# Patient Record
Sex: Male | Born: 1940 | Race: White | Hispanic: No | Marital: Married | Smoking: Former smoker
Health system: Southern US, Community
[De-identification: ages and names within clinical notes are randomized; demographics above are authoritative.]

## PROBLEM LIST (undated history)

## (undated) DIAGNOSIS — I714 Abdominal aortic aneurysm, without rupture, unspecified: Secondary | ICD-10-CM

## (undated) DIAGNOSIS — K5790 Diverticulosis of intestine, part unspecified, without perforation or abscess without bleeding: Secondary | ICD-10-CM

## (undated) DIAGNOSIS — IMO0002 Reserved for concepts with insufficient information to code with codable children: Secondary | ICD-10-CM

## (undated) DIAGNOSIS — E041 Nontoxic single thyroid nodule: Secondary | ICD-10-CM

## (undated) DIAGNOSIS — J40 Bronchitis, not specified as acute or chronic: Secondary | ICD-10-CM

## (undated) DIAGNOSIS — M899 Disorder of bone, unspecified: Secondary | ICD-10-CM

## (undated) DIAGNOSIS — N529 Male erectile dysfunction, unspecified: Secondary | ICD-10-CM

## (undated) DIAGNOSIS — J449 Chronic obstructive pulmonary disease, unspecified: Secondary | ICD-10-CM

## (undated) DIAGNOSIS — E785 Hyperlipidemia, unspecified: Secondary | ICD-10-CM

## (undated) DIAGNOSIS — C801 Malignant (primary) neoplasm, unspecified: Secondary | ICD-10-CM

## (undated) DIAGNOSIS — R0602 Shortness of breath: Secondary | ICD-10-CM

## (undated) DIAGNOSIS — M549 Dorsalgia, unspecified: Secondary | ICD-10-CM

## (undated) DIAGNOSIS — I739 Peripheral vascular disease, unspecified: Secondary | ICD-10-CM

## (undated) DIAGNOSIS — I499 Cardiac arrhythmia, unspecified: Secondary | ICD-10-CM

## (undated) DIAGNOSIS — J439 Emphysema, unspecified: Secondary | ICD-10-CM

## (undated) DIAGNOSIS — K219 Gastro-esophageal reflux disease without esophagitis: Secondary | ICD-10-CM

## (undated) DIAGNOSIS — J189 Pneumonia, unspecified organism: Secondary | ICD-10-CM

## (undated) DIAGNOSIS — D649 Anemia, unspecified: Secondary | ICD-10-CM

## (undated) DIAGNOSIS — I10 Essential (primary) hypertension: Secondary | ICD-10-CM

## (undated) HISTORY — DX: Dorsalgia, unspecified: M54.9

## (undated) HISTORY — DX: Gastro-esophageal reflux disease without esophagitis: K21.9

## (undated) HISTORY — PX: COLONOSCOPY W/ POLYPECTOMY: SHX1380

## (undated) HISTORY — DX: Male erectile dysfunction, unspecified: N52.9

## (undated) HISTORY — DX: Nontoxic single thyroid nodule: E04.1

## (undated) HISTORY — DX: Disorder of bone, unspecified: M89.9

## (undated) HISTORY — DX: Reserved for concepts with insufficient information to code with codable children: IMO0002

## (undated) HISTORY — DX: Abdominal aortic aneurysm, without rupture, unspecified: I71.40

## (undated) HISTORY — DX: Hyperlipidemia, unspecified: E78.5

## (undated) HISTORY — DX: Chronic obstructive pulmonary disease, unspecified: J44.9

## (undated) HISTORY — DX: Essential (primary) hypertension: I10

## (undated) HISTORY — DX: Cardiac arrhythmia, unspecified: I49.9

## (undated) HISTORY — DX: Peripheral vascular disease, unspecified: I73.9

## (undated) HISTORY — PX: TONSILLECTOMY: SUR1361

## (undated) HISTORY — DX: Diverticulosis of intestine, part unspecified, without perforation or abscess without bleeding: K57.90

## (undated) HISTORY — PX: SKIN GRAFT: SHX250

## (undated) HISTORY — DX: Bronchitis, not specified as acute or chronic: J40

## (undated) HISTORY — DX: Abdominal aortic aneurysm, without rupture: I71.4

## (undated) HISTORY — PX: SMALL INTESTINE SURGERY: SHX150

## (undated) HISTORY — DX: Anemia, unspecified: D64.9

---

## 1959-07-05 HISTORY — PX: COLECTOMY: SHX59

## 1998-01-22 ENCOUNTER — Emergency Department (HOSPITAL_COMMUNITY): Admission: EM | Admit: 1998-01-22 | Discharge: 1998-01-22 | Payer: Self-pay | Admitting: Emergency Medicine

## 1998-07-09 ENCOUNTER — Ambulatory Visit: Admission: RE | Admit: 1998-07-09 | Discharge: 1998-07-09 | Payer: Self-pay | Admitting: *Deleted

## 2004-08-24 ENCOUNTER — Ambulatory Visit: Payer: Self-pay | Admitting: Internal Medicine

## 2004-10-04 ENCOUNTER — Ambulatory Visit: Payer: Self-pay | Admitting: Gastroenterology

## 2004-10-13 ENCOUNTER — Ambulatory Visit: Payer: Self-pay | Admitting: Gastroenterology

## 2004-12-15 ENCOUNTER — Ambulatory Visit: Payer: Self-pay | Admitting: Internal Medicine

## 2005-01-11 ENCOUNTER — Ambulatory Visit: Payer: Self-pay | Admitting: Internal Medicine

## 2006-08-02 ENCOUNTER — Ambulatory Visit: Payer: Self-pay | Admitting: Internal Medicine

## 2006-08-28 ENCOUNTER — Ambulatory Visit: Payer: Self-pay | Admitting: Internal Medicine

## 2006-11-21 ENCOUNTER — Ambulatory Visit: Payer: Self-pay | Admitting: Internal Medicine

## 2006-11-21 LAB — CONVERTED CEMR LAB
ALT: 24 units/L (ref 0–40)
Cholesterol: 162 mg/dL (ref 0–200)
HDL: 34.3 mg/dL — ABNORMAL LOW (ref >39.0)
Total CHOL/HDL Ratio: 4.7

## 2006-12-14 ENCOUNTER — Ambulatory Visit: Payer: Self-pay | Admitting: Internal Medicine

## 2006-12-14 DIAGNOSIS — E785 Hyperlipidemia, unspecified: Secondary | ICD-10-CM

## 2006-12-15 ENCOUNTER — Encounter: Payer: Self-pay | Admitting: Internal Medicine

## 2007-06-26 ENCOUNTER — Telehealth: Payer: Self-pay | Admitting: Internal Medicine

## 2007-06-27 ENCOUNTER — Ambulatory Visit: Payer: Self-pay | Admitting: Internal Medicine

## 2007-07-20 ENCOUNTER — Encounter: Payer: Self-pay | Admitting: Internal Medicine

## 2007-08-24 ENCOUNTER — Encounter: Payer: Self-pay | Admitting: Internal Medicine

## 2007-12-19 ENCOUNTER — Ambulatory Visit: Payer: Self-pay | Admitting: Gastroenterology

## 2008-01-02 ENCOUNTER — Encounter: Payer: Self-pay | Admitting: Gastroenterology

## 2008-01-02 ENCOUNTER — Ambulatory Visit: Payer: Self-pay | Admitting: Gastroenterology

## 2008-01-02 LAB — HM COLONOSCOPY: HM Colonoscopy: NORMAL

## 2008-01-07 ENCOUNTER — Encounter: Payer: Self-pay | Admitting: Gastroenterology

## 2008-02-28 ENCOUNTER — Encounter: Payer: Self-pay | Admitting: Internal Medicine

## 2008-04-03 ENCOUNTER — Ambulatory Visit: Payer: Self-pay | Admitting: Internal Medicine

## 2008-04-03 DIAGNOSIS — K219 Gastro-esophageal reflux disease without esophagitis: Secondary | ICD-10-CM

## 2008-04-03 DIAGNOSIS — Z860100 Personal history of colon polyps, unspecified: Secondary | ICD-10-CM | POA: Insufficient documentation

## 2008-04-03 DIAGNOSIS — I10 Essential (primary) hypertension: Secondary | ICD-10-CM

## 2008-04-07 ENCOUNTER — Telehealth (INDEPENDENT_AMBULATORY_CARE_PROVIDER_SITE_OTHER): Payer: Self-pay | Admitting: *Deleted

## 2008-04-16 ENCOUNTER — Encounter (INDEPENDENT_AMBULATORY_CARE_PROVIDER_SITE_OTHER): Payer: Self-pay | Admitting: *Deleted

## 2008-04-16 LAB — CONVERTED CEMR LAB
ALT: 51 units/L (ref 0–53)
AST: 37 units/L (ref 0–37)
Basophils Absolute: 0.1 10*3/uL (ref 0.0–0.1)
Basophils Relative: 1 % (ref 0.0–3.0)
Creatinine, Ser: 0.9 mg/dL (ref 0.4–1.5)
HCT: 46.6 % (ref 39.0–52.0)
HDL: 31.7 mg/dL — ABNORMAL LOW (ref >39.0)
Hemoglobin: 15.9 g/dL (ref 13.0–17.0)
LDL Cholesterol: 136 mg/dL — ABNORMAL HIGH (ref 0–99)
Lymphocytes Relative: 17.9 % (ref 12.0–46.0)
MCV: 90 fL (ref 78.0–100.0)
Monocytes Absolute: 0.9 10*3/uL (ref 0.1–1.0)
Neutro Abs: 4.8 10*3/uL (ref 1.4–7.7)
Neutrophils Relative %: 65.2 % (ref 43.0–77.0)
Platelets: 149 10*3/uL — ABNORMAL LOW (ref 150–400)
RDW: 13.7 % (ref 11.5–14.6)
Total CHOL/HDL Ratio: 6.1
Total Protein: 7.5 g/dL (ref 6.0–8.3)
Triglycerides: 128 mg/dL (ref 0–149)
VLDL: 26 mg/dL (ref 0–40)

## 2008-09-25 ENCOUNTER — Encounter: Payer: Self-pay | Admitting: Internal Medicine

## 2008-10-01 ENCOUNTER — Ambulatory Visit: Payer: Self-pay | Admitting: Internal Medicine

## 2008-10-01 DIAGNOSIS — I868 Varicose veins of other specified sites: Secondary | ICD-10-CM | POA: Insufficient documentation

## 2009-01-02 ENCOUNTER — Ambulatory Visit: Payer: Self-pay | Admitting: Internal Medicine

## 2009-01-02 DIAGNOSIS — K5289 Other specified noninfective gastroenteritis and colitis: Secondary | ICD-10-CM

## 2009-01-05 LAB — CONVERTED CEMR LAB
BUN: 15 mg/dL (ref 6–23)
Basophils Absolute: 0 10*3/uL (ref 0.0–0.1)
Basophils Relative: 0 % (ref 0–1)
Creatinine, Ser: 0.93 mg/dL (ref 0.40–1.50)
Eosinophils Absolute: 0.1 10*3/uL (ref 0.0–0.7)
Eosinophils Relative: 1 % (ref 0–5)
HCT: 43 % (ref 39.0–52.0)
Hemoglobin: 14.2 g/dL (ref 13.0–17.0)
Lymphocytes Relative: 14 % (ref 12–46)
MCHC: 33 g/dL (ref 30.0–36.0)
Neutrophils Relative %: 74 % (ref 43–77)
Potassium: 3.5 meq/L (ref 3.5–5.3)
RDW: 14 % (ref 11.5–15.5)
WBC: 9.9 10*3/uL (ref 4.0–10.5)

## 2009-01-07 ENCOUNTER — Encounter (INDEPENDENT_AMBULATORY_CARE_PROVIDER_SITE_OTHER): Payer: Self-pay | Admitting: *Deleted

## 2009-02-05 ENCOUNTER — Encounter: Payer: Self-pay | Admitting: Internal Medicine

## 2009-09-11 ENCOUNTER — Encounter: Payer: Self-pay | Admitting: Internal Medicine

## 2010-01-19 ENCOUNTER — Telehealth (INDEPENDENT_AMBULATORY_CARE_PROVIDER_SITE_OTHER): Payer: Self-pay | Admitting: *Deleted

## 2010-01-19 ENCOUNTER — Emergency Department (HOSPITAL_COMMUNITY): Admission: EM | Admit: 2010-01-19 | Discharge: 2010-01-19 | Payer: Self-pay | Admitting: Emergency Medicine

## 2010-01-28 ENCOUNTER — Ambulatory Visit: Payer: Self-pay | Admitting: Internal Medicine

## 2010-01-28 DIAGNOSIS — R109 Unspecified abdominal pain: Secondary | ICD-10-CM | POA: Insufficient documentation

## 2010-01-28 DIAGNOSIS — K5730 Diverticulosis of large intestine without perforation or abscess without bleeding: Secondary | ICD-10-CM | POA: Insufficient documentation

## 2010-01-29 LAB — CONVERTED CEMR LAB
Basophils Absolute: 0.1 10*3/uL (ref 0.0–0.1)
Basophils Relative: 0.6 % (ref 0.0–3.0)
Lymphs Abs: 1.5 10*3/uL (ref 0.7–4.0)
MCHC: 34.5 g/dL (ref 30.0–36.0)
Monocytes Relative: 11.2 % (ref 3.0–12.0)
Neutro Abs: 5.7 10*3/uL (ref 1.4–7.7)
Neutrophils Relative %: 66.6 % (ref 43.0–77.0)
Platelets: 163 10*3/uL (ref 150.0–400.0)
RDW: 14.2 % (ref 11.5–14.6)
WBC: 8.5 10*3/uL (ref 4.5–10.5)

## 2010-03-08 ENCOUNTER — Inpatient Hospital Stay (HOSPITAL_COMMUNITY): Admission: EM | Admit: 2010-03-08 | Discharge: 2010-03-11 | Payer: Self-pay | Admitting: Emergency Medicine

## 2010-03-09 ENCOUNTER — Telehealth: Payer: Self-pay | Admitting: Internal Medicine

## 2010-03-15 ENCOUNTER — Emergency Department (HOSPITAL_COMMUNITY): Admission: EM | Admit: 2010-03-15 | Discharge: 2010-03-15 | Payer: Self-pay | Admitting: Emergency Medicine

## 2010-03-15 ENCOUNTER — Telehealth (INDEPENDENT_AMBULATORY_CARE_PROVIDER_SITE_OTHER): Payer: Self-pay | Admitting: *Deleted

## 2010-03-23 ENCOUNTER — Encounter: Payer: Self-pay | Admitting: Internal Medicine

## 2010-05-04 ENCOUNTER — Encounter: Payer: Self-pay | Admitting: Internal Medicine

## 2010-08-01 LAB — CONVERTED CEMR LAB
ALT: 18 units/L (ref 0–40)
BUN: 9 mg/dL (ref 6–23)
Creatinine, Ser: 1 mg/dL (ref 0.4–1.5)
PSA: 7.34 ng/mL — ABNORMAL HIGH (ref 0.10–4.00)
Potassium: 4.2 meq/L (ref 3.5–5.1)

## 2010-08-03 NOTE — Progress Notes (Signed)
Summary: not having BM  Phone Note Call from Patient   Caller: Patient Summary of Call: Spoke w/ patient wife says that husband just got out of hospital on thurs. 03/11/10 after having surgery for bowel obstruction hasn't had BM since patient has been very gassy and when he passed gas noticed some blood informed wife that patient needs to go to emergency room today due to potential surgical problems. Also spoke w/ Hop and he agreed wife ok'd information just wasn't sure what to do since she hadn't been giving any f/u information. Initial call taken by: Doristine Devoid CMA,  March 15, 2010 4:32 PM

## 2010-08-03 NOTE — Letter (Signed)
Summary: [facility]  [facility]   Imported By: Lanelle Bal 05/18/2010 10:14:29  _____________________________________________________________________  External Attachment:    Type:   Image     Comment:   External Document

## 2010-08-03 NOTE — Letter (Signed)
Summary: [facility]  [facility]   Imported By: Lanelle Bal 04/01/2010 12:02:22  _____________________________________________________________________  External Attachment:    Type:   Image     Comment:   External Document

## 2010-08-03 NOTE — Progress Notes (Signed)
Summary: Triage: Rib Pain  Phone Note Call from Patient Call back at Home Phone [phone]   Caller: Patient Summary of Call: Message left on triage VM: Sharpe pain in left side   Spoke with patient's wife, sharpe pain since last night (rib area) pain has increased today. Patient was told to seek medical attention as quickly as possibe at the emergency room to r/o heart related condition, wife was informed if patient goes to another hospital other than [facility] or [facility] to obtain copy of all records./[facility] CMA  January 19, 2010 1:59 PM

## 2010-08-03 NOTE — Progress Notes (Signed)
Summary: [name]  Phone Note Outgoing Call Call back at [facility] Phone [phone] Call back at [phone]   Call placed by: [facility] CMA,  March 09, 2010 8:26 AM Details for Reason: [facility] Triage Call Report Triage Record Num: [id number] Operator: Di Kindle Patient Name: [name] Call Date & Time: 03/08/2010 9:12:33AM Patient Phone: [phone] PCP: Marga Melnick Patient Gender: Male PCP Fax : [phone] Patient DOB: 16-Feb-1941 Practice Name: [facility] Reason for Call: Emergent call, Spouse [name] calling, onset of vomiting: 03/06/10: " green stuff" with diarrhea. Last urine 0800. 03/08/10. With pain in side since 01/28/10, taking hycosamine. Guideline: vomiting. Advised of need to be seen in ED: [facility]. Protocol(s) Used: Nausea or Vomiting Recommended Outcome per Protocol: See ED Immediately Reason for Outcome: Abdominal pain, which may be colicky, followed by episodes of vomiting occurring for more than 8 hours; abdominal swelling may or may not occur. Care Advice:  ~ Another adult should drive.  ~ Do not give the patient anything to eat or drink. Write down provider's name. List or place the following in a bag for transport with the patient: current prescription and/or nonprescription medications; alternative treatments, therapies and medications; and street drugs.  ~ 09/ Summary of Call: Spoke with pt he is currently in [place] hospital. Advise pt to call to schedule f/u appt with office once he is D/C, pt ok............Marland KitchenFelecia Deloach CMA  March 09, 2010 8:30 AM   Follow-up for Phone Call        noted Follow-up by: Marga Melnick MD,  March 09, 2010 5:00 PM

## 2010-08-03 NOTE — Assessment & Plan Note (Signed)
Summary: ed follow up - pain in side/cbs   Vital Signs:  Patient profile:   70 year old male Weight:      199.6 pounds BMI:     29.80 Temp:     97.6 degrees F oral Pulse rate:   84 / minute Resp:     15 per minute BP sitting:   122 / 70  (left arm) Cuff size:   large  Vitals Entered By: Shonna Chock CMA (January 28, 2010 1:20 PM) CC: 1.) Follow-up from [facility] ER  2.) Discuss B/P med, Abdominal pain   CC:  1.) Follow-up from [facility] ER  2.) Discuss B/P med and Abdominal pain.  History of Present Illness:  Abdominal Pain      This is a 70 year old man who presents with Abdominal pain 01/19/2010. He was seen in ER 12 hrs after onset . Those records were reviewed.CT scans: atherosclerosis , fatty liver.CBC & dif,BMET & UA WNL.Pain since injection in ER ,pain is much less frequent & severe.  Last  episode  was 07/27 & minor. The patient denies nausea, vomiting, diarrhea, constipation, melena, hematochezia, anorexia, and hematemesis.  The location of the pain is left  abdomen in mid axillary line  .The pain is described as intermittent, sharp, and shooting  in quality.  The patient denies the following symptoms: fever, weight loss, dysuria, chest pain, jaundice, and dark urine.  The pain has no triggers or relievers. Colonoscopy for polyp monitor due 2012.  Current Medications (verified): 1)  Omeprazole 20 Mg  Cpdr (Omeprazole) .Marland Kitchen.. 1 By Mouth Once Daily **appointment Due** 2)  Flomax 0.4 Mg  Cp24 (Tamsulosin Hcl) .... Take 1 Tablet By Mouth Once A Day 3)  Adult Aspirin Ec Low Strength 81 Mg  Tbec (Aspirin) .... Once Daily 4)  Diltiazem Hcl 120 Mg  Tabs (Diltiazem Hcl) .Marland Kitchen.. 1 By Mouth Bid 5)  Pravastatin Sodium 40 Mg  Tabs (Pravastatin Sodium) .Marland Kitchen.. 1 At Bedtime  Allergies: 1)  ! Benazepril Hcl (Benazepril Hcl) 2)  ! Hydrochlorothiazide (Hydrochlorothiazide)  Past History:  Past Medical History: Hyperlipidemia femoral bruit hypertension OAD PSA GERD Colonic polyps, hx of  2006 & 2009, adenomatous, Dr Jarold Motto Diverticulosis, colon  Past Surgical History: INTESTINAL SURGERY SKIN GRAFT HAND COLONOSCOPY TICS, POLYPS 2006 Tonsillectomy Prostate biopsies: negative, Dr Isabel Caprice  Review of Systems General:  Complains of sweats; denies chills. ENT:  Denies difficulty swallowing and hoarseness. GU:  Denies discharge and hematuria. MS:  Complains of low back pain; R not L LBP occasionally. Derm:  Denies lesion(s) and rash. Neuro:  Denies numbness, tingling, and weakness; No radicular pain. Heme:  Denies abnormal bruising and bleeding.  Physical Exam  General:  Appears younger than age,well-nourished,in no acute distress; alert,appropriate and cooperative throughout examination Eyes:  No corneal or conjunctival inflammation noted.No icterus Mouth:  Oral mucosa and oropharynx without lesions or exudates.  Dentures. Moderate pharyngeal erythema.   Lungs:  Normal respiratory effort, chest expands symmetrically. Lungs are clear to auscultation, no crackles or wheezes. Heart:  Normal rate and regular rhythm. S1 and S2 normal without gallop, murmur, click, rub . S 4 with slurring Abdomen:  Bowel sounds positive,abdomen soft and non-tender without masses, organomegaly or hernias noted. Extremities:  No clubbing, cyanosis, edema. Minor DIP DJD. Neg SLR Skin:  Intact without suspicious lesions or rashes. No jaundice Cervical Nodes:  No lymphadenopathy noted Axillary Nodes:  No palpable lymphadenopathy   Impression & Recommendations:  Problem # 1:  ABDOMINAL PAIN (  ICD-789.00)  Orders: Venipuncture (09811) TLB-CBC Platelet - w/Differential (85025-CBCD) Prescription Created Electronically [phone])  Problem # 2:  DIVERTICULOSIS, COLON (ICD-562.10) PMH of; clinically no Diverticulitis  Problem # 3:  COLONIC POLYPS, HX OF (ICD-V12.72) Colonoscopy due 2012  Complete Medication List: 1)  Omeprazole 20 Mg Cpdr (Omeprazole) .Marland Kitchen.. 1 by mouth once daily **appointment  due** 2)  Flomax 0.4 Mg Cp24 (Tamsulosin hcl) .... Take 1 tablet by mouth once a day 3)  Adult Aspirin Ec Low Strength 81 Mg Tbec (Aspirin) .... Once daily 4)  Diltiazem Hcl 120 Mg Tabs (Diltiazem hcl) .Marland Kitchen.. 1 by mouth bid 5)  Pravastatin Sodium 40 Mg Tabs (Pravastatin sodium) .Marland Kitchen.. 1 at bedtime 6)  Hyomax-sl 0.125 Mg Subl (Hyoscyamine sulfate) .Marland Kitchen.. 1 under tongue every 6 hrs as needed  Patient Instructions: 1)  Complete stool cards. Keep Diary of possible triggers. Prescriptions: HYOMAX-SL 0.125 MG SUBL (HYOSCYAMINE SULFATE) 1 under tongue every 6 hrs as needed  #30 x 1   Entered and Authorized by:   Marga Melnick MD   Signed by:   Marga Melnick MD on 01/28/2010   Method used:   Faxed to ...       [facility] [address] * (retail)       [address]       [place], [place]  [address]       Ph: [phone]       Fax: [phone]   RxID:   [id number]   Appended Document: ed follow up - pain in side/cbs

## 2010-08-03 NOTE — Letter (Signed)
Summary: [facility]  [facility]   Imported By: Lanelle Bal 09/18/2009 12:30:38  _____________________________________________________________________  External Attachment:    Type:   Image     Comment:   External Document

## 2010-08-04 ENCOUNTER — Telehealth (INDEPENDENT_AMBULATORY_CARE_PROVIDER_SITE_OTHER): Payer: Self-pay | Admitting: *Deleted

## 2010-08-11 NOTE — Progress Notes (Signed)
Summary: Refill Request  Phone Note Refill Request Call back at [phone] Message from:  Pharmacy on August 04, 2010 8:19 AM  Refills Requested: Medication #1:  DILTIAZEM HCL 120 MG  TABS 1 by mouth bid   Dosage confirmed as above?Dosage Confirmed   Supply Requested: 180   Last Refilled: 12/21/2009 [facility] on [address] in [place], [place]  Next Appointment Scheduled: none Initial call taken by: Harold Barban,  August 04, 2010 8:20 AM  Follow-up for Phone Call        RX faxed to [phone] Follow-up by: Shonna Chock CMA,  August 04, 2010 10:44 AM    Prescriptions: DILTIAZEM HCL 120 MG  TABS (DILTIAZEM HCL) 1 by mouth bid  #180 Each x 1   Entered by:   Shonna Chock CMA   Authorized by:   Marga Melnick MD   Signed by:   Shonna Chock CMA on 08/04/2010   Method used:   Print then Give to Patient   RxID:   [id number]

## 2010-09-16 LAB — COMPREHENSIVE METABOLIC PANEL
ALT: 21 U/L (ref 0–53)
ALT: 35 U/L (ref 0–53)
AST: 14 U/L (ref 0–37)
AST: 31 U/L (ref 0–37)
Albumin: 4.4 g/dL (ref 3.5–5.2)
Alkaline Phosphatase: 67 U/L (ref 39–117)
BUN: 28 mg/dL — ABNORMAL HIGH (ref 6–23)
CO2: 23 mEq/L (ref 19–32)
CO2: 28 mEq/L (ref 19–32)
Chloride: 101 mEq/L (ref 96–112)
Creatinine, Ser: 1.33 mg/dL (ref 0.4–1.5)
GFR calc Af Amer: >60 mL/min (ref >60)
GFR calc non Af Amer: >60 mL/min (ref >60)
Glucose, Bld: 110 mg/dL — ABNORMAL HIGH (ref 70–99)
Sodium: 138 mEq/L (ref 135–145)
Total Bilirubin: 1.2 mg/dL (ref 0.3–1.2)
Total Bilirubin: 2.5 mg/dL — ABNORMAL HIGH (ref 0.3–1.2)

## 2010-09-16 LAB — POCT CARDIAC MARKERS
Myoglobin, poc: 70.9 ng/mL (ref 12–200)
Myoglobin, poc: 82.2 ng/mL (ref 12–200)
Troponin i, poc: <0.05 ng/mL (ref 0.00–0.09)
Troponin i, poc: <0.05 ng/mL (ref 0.00–0.09)

## 2010-09-16 LAB — CBC
HCT: 41.7 % (ref 39.0–52.0)
Hemoglobin: 14.1 g/dL (ref 13.0–17.0)
Hemoglobin: 18 g/dL — ABNORMAL HIGH (ref 13.0–17.0)
MCH: 30.9 pg (ref 26.0–34.0)
MCHC: 33.8 g/dL (ref 30.0–36.0)
MCHC: 34.1 g/dL (ref 30.0–36.0)
MCV: 88.7 fL (ref 78.0–100.0)
MCV: 89.2 fL (ref 78.0–100.0)
Platelets: 217 10*3/uL (ref 150–400)
RBC: 4.61 MIL/uL (ref 4.22–5.81)
RBC: 4.69 MIL/uL (ref 4.22–5.81)
RDW: 13.5 % (ref 11.5–15.5)
WBC: 9.2 10*3/uL (ref 4.0–10.5)

## 2010-09-16 LAB — STOOL CULTURE

## 2010-09-16 LAB — LIPASE, BLOOD
Lipase: 20 U/L (ref 11–59)
Lipase: 25 U/L (ref 11–59)

## 2010-09-16 LAB — URINE MICROSCOPIC-ADD ON

## 2010-09-16 LAB — URINALYSIS, ROUTINE W REFLEX MICROSCOPIC
Glucose, UA: NEGATIVE mg/dL
Nitrite: POSITIVE — AB
Protein, ur: 30 mg/dL — AB
Urobilinogen, UA: 1 mg/dL (ref 0.0–1.0)
pH: 5 (ref 5.0–8.0)

## 2010-09-16 LAB — GIARDIA/CRYPTOSPORIDIUM SCREEN(EIA): Giardia Screen - EIA: NEGATIVE

## 2010-09-16 LAB — DIFFERENTIAL
Basophils Absolute: 0 10*3/uL (ref 0.0–0.1)
Basophils Absolute: 0 10*3/uL (ref 0.0–0.1)
Basophils Relative: 0 % (ref 0–1)
Basophils Relative: 0 % (ref 0–1)
Eosinophils Absolute: 0 10*3/uL (ref 0.0–0.7)
Eosinophils Absolute: 0.2 10*3/uL (ref 0.0–0.7)
Eosinophils Relative: 0 % (ref 0–5)
Eosinophils Relative: 2 % (ref 0–5)
Lymphocytes Relative: 4 % — ABNORMAL LOW (ref 12–46)
Lymphs Abs: 0.5 10*3/uL — ABNORMAL LOW (ref 0.7–4.0)
Neutrophils Relative %: 73 % (ref 43–77)
Neutrophils Relative %: 81 % — ABNORMAL HIGH (ref 43–77)

## 2010-09-16 LAB — CLOSTRIDIUM DIFFICILE EIA

## 2010-09-16 LAB — BASIC METABOLIC PANEL
BUN: 6 mg/dL (ref 6–23)
Chloride: 106 mEq/L (ref 96–112)
GFR calc Af Amer: >60 mL/min (ref >60)
Glucose, Bld: 114 mg/dL — ABNORMAL HIGH (ref 70–99)
Potassium: 3.6 mEq/L (ref 3.5–5.1)
Sodium: 139 mEq/L (ref 135–145)

## 2010-09-18 LAB — URINALYSIS, ROUTINE W REFLEX MICROSCOPIC

## 2010-09-18 LAB — DIFFERENTIAL
Eosinophils Absolute: 0.2 10*3/uL (ref 0.0–0.7)
Lymphocytes Relative: 19 % (ref 12–46)
Lymphs Abs: 1.3 10*3/uL (ref 0.7–4.0)
Monocytes Relative: 12 % (ref 3–12)
Neutrophils Relative %: 66 % (ref 43–77)

## 2010-09-18 LAB — CBC
Hemoglobin: 15.6 g/dL (ref 13.0–17.0)
MCH: 30.7 pg (ref 26.0–34.0)
MCV: 89.3 fL (ref 78.0–100.0)
Platelets: 145 10*3/uL — ABNORMAL LOW (ref 150–400)
RBC: 5.09 MIL/uL (ref 4.22–5.81)
WBC: 7.1 10*3/uL (ref 4.0–10.5)

## 2010-09-18 LAB — BASIC METABOLIC PANEL
CO2: 27 mEq/L (ref 19–32)
Chloride: 104 mEq/L (ref 96–112)
GFR calc Af Amer: >60 mL/min (ref >60)
Sodium: 137 mEq/L (ref 135–145)

## 2010-11-19 NOTE — Letter (Signed)
August 07, 2006    Valetta Fuller, M.D.  [address]  [place], [place] [address]   RE:  [name], Chad  MRN:  [id number]  /  DOB:  02-May-1941   Dear Onalee Hua,   I saw Esaias for a medication refill on January 30. At that time, his  blood pressure was suboptimally controlled with a blood pressure of  142/70. This was the level that he generally noted when he did self  checks in a drug store. He denied epistaxis or headaches. He is very  physically [place] and denies significant cardiopulmonary  symptoms except for dyspnea on exertion climbing hills.   He takes Prevacid as needed but continues to have occasional pill  dysphagia.   You had recommended that he be reevaluated in a year in your note of  December 09, 2005; he did not remember this recommendation.   His weight is essentially stable at 191, pulse was 80 and respiratory  rate 14. He  does have arteriolar narrowing. 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 An S4 gallop was noted. Dorsalis pedis pulses were slightly  decreased. He had no edema. I did not complete a rectal but provided him  with stool cards. He had a tubular adenoma in 2006 and if stool cars are  positive, gastroenterologic reevaluation would be indicated especially  if if dysphagia persists or progresses.   I did prescribe omeprazole for his reflux and provided him with a  trigger list.   He does have dyslipidemia and a LipoProfile will be performed  to  adequately assess his risk.   His BUN, creatinine, SGOT, SGPT and potassium were normal. Glucose was  mildly elevated at 102.   His PSA is now 7.34. In October 2005, it was 4.1 and in July 2006 it was  3.95 at our office. I sent him a copy of the lab reports and requested  that he schedule a followup with you. Would you have your scheduler  verify that this is completed.    Sincerely,      Titus Dubin. Alwyn Ren, MD,FACP,FCCP  Electronically Signed    [facility]  DD: 08/07/2006   DT: 08/07/2006  Job #: [id number]

## 2011-01-08 ENCOUNTER — Encounter: Payer: Self-pay | Admitting: Internal Medicine

## 2011-01-11 ENCOUNTER — Encounter: Payer: Self-pay | Admitting: Internal Medicine

## 2011-01-11 ENCOUNTER — Ambulatory Visit (INDEPENDENT_AMBULATORY_CARE_PROVIDER_SITE_OTHER): Payer: Medicare Other | Admitting: Internal Medicine

## 2011-01-11 VITALS — BP 140/82 | HR 80 | Temp 97.6°F | Wt 197.6 lb

## 2011-01-11 DIAGNOSIS — M545 Low back pain, unspecified: Secondary | ICD-10-CM

## 2011-01-11 DIAGNOSIS — R5381 Other malaise: Secondary | ICD-10-CM

## 2011-01-11 LAB — CBC WITH DIFFERENTIAL/PLATELET
Basophils Relative: 0.5 % (ref 0.0–3.0)
Eosinophils Relative: 2.9 % (ref 0.0–5.0)
Hemoglobin: 15.4 g/dL (ref 13.0–17.0)
Lymphocytes Relative: 17.7 % (ref 12.0–46.0)
MCHC: 34.4 g/dL (ref 30.0–36.0)
Monocytes Relative: 10.4 % (ref 3.0–12.0)
Neutro Abs: 4.6 10*3/uL (ref 1.4–7.7)
Neutrophils Relative %: 68.5 % (ref 43.0–77.0)
RBC: 5.05 Mil/uL (ref 4.22–5.81)
WBC: 6.8 10*3/uL (ref 4.5–10.5)

## 2011-01-11 LAB — HEPATIC FUNCTION PANEL
ALT: 22 U/L (ref 0–53)
AST: 16 U/L (ref 0–37)
Albumin: 4.5 g/dL (ref 3.5–5.2)
Total Bilirubin: 1.3 mg/dL — ABNORMAL HIGH (ref 0.3–1.2)
Total Protein: 7.7 g/dL (ref 6.0–8.3)

## 2011-01-11 LAB — BASIC METABOLIC PANEL
BUN: 16 mg/dL (ref 6–23)
CO2: 27 mEq/L (ref 19–32)
Chloride: 105 mEq/L (ref 96–112)
Creatinine, Ser: 0.9 mg/dL (ref 0.4–1.5)
Glucose, Bld: 89 mg/dL (ref 70–99)
Potassium: 3.8 mEq/L (ref 3.5–5.1)

## 2011-01-11 LAB — TSH: TSH: 0.89 u[IU]/mL (ref 0.35–5.50)

## 2011-01-11 MED ORDER — CYCLOBENZAPRINE HCL 5 MG PO TABS: 5.0000 mg | ORAL_TABLET | Freq: Three times a day (TID) | ORAL | Status: DC | PRN

## 2011-01-11 MED ORDER — TRAMADOL HCL 50 MG PO TABS: 50.0000 mg | ORAL_TABLET | Freq: Four times a day (QID) | ORAL | Status: DC | PRN

## 2011-01-11 NOTE — Patient Instructions (Addendum)
If the back pain does persist or progresses, I recommend chiropractory or physical therapy. Complete stool cards. Sleep Apnea testing if no better

## 2011-01-11 NOTE — Progress Notes (Signed)
Subjective:    Patient ID: [name], male    DOB: Nov 06, 1940, 70 y.o.   MRN: [id number]  HPI#1 BACK  PAIN Location: R LS area   Onset: 1  Month ago after moving waterbed  mattress  Severity: up to 9 Pain is described as: sharp Duration : seconds  Worse with: waist rotation , R > L    Better with: avoiding position ; heat & NSAIDS of no benefit Pain radiates to: slightly to R rib cage   Impaired range of motion: stiff due to restricted activities  History of repetitive motion:  yes, see above  History of overuse or hyperextension:  no  History of trauma:  no   Past history of similar problem:  yes, but < severe  Symptoms Numbness/tingling:  no  Weakness:  no Red Flags Fever:  no  Bowel/bladder dysfunction:  No  #2 FATIGUE Onset: 6 mos   Fatigue @ rest : yes    Primarily motivational fatigue: yes  Symptoms: Fever/ chills : no  Night sweats: no                                                                                            Vision changes ( blurred/ double/ loss): no                                                                                                   Hoarseness or swallowing dysfunction: no                                                                                          Bowel changes( constipation/ diarrhea): no                                                                                      Weight change: no   Exertional chest pain: no  Dyspnea on exertion: yes, stable  Cough: no  Hemoptysis: no  New medications: no  Leg swelling: yes, in context of V V  Orthopnea: no  PND:  no  Melena/ rectal bleeding: no  Adenopathy: no  Severe snoring: yes as per wife Daytime sleepiness: yes  Skin / hair / nail changes: no  Temperature intolerance( heat/ cold) : yes to heat                                                                                                    Feeling depressed: no, but wife questions this ("she says I never  smile") Anhedonia: yes,   Altered appetite: no  Poor sleep/ Apnea : ? As per wife  Abnormal bruising / bleeding or enlarged lymph nodes:? Easy bleeding                                                                          PMH/ FH of thyroid disease/depression: no          Review of Systems     Objective:   Physical Exam Gen.: Healthy and well-nourished in appearance. appropriate and cooperative throughout exam. Head: Normocephalic without obvious abnormalities Eyes: No corneal or conjunctival inflammation noted. Pupils equal round reactive to light and accommodation.  Extraocular motion intact ; OS deviates laterally with accommodation Neck: No deformities, masses, or tenderness noted. Range of motion slightly decreased laterally. Thyroid  normal. Lungs: Normal respiratory effort; chest expands symmetrically. Lungs are clear to auscultation without rales, wheezes, or increased work of breathing. Heart: Normal rate and rhythm. Normal S1 and S2. No gallop, click, or rub. No murmur. Abdomen: Bowel sounds normal; abdomen soft and nontender. No masses, organomegaly .Ventral hernia  noted.  .                                                                                   Musculoskeletal/extremities: No deformity or scoliosis noted of  the thoracic or lumbar spine. He lay down & sat up w/o help.Neg SLR. No clubbing, cyanosis, edema. Flexion @ 5th R DIP noted. Range of motion  normal .Tone & strength  normal. Chronic fungal  toenail changes Vascular: Carotid, radial artery, dorsalis pedis and  posterior tibial pulses are full and equal. No bruits present. Neurologic: Alert and oriented x3. Deep tendon reflexes symmetrical and normal. Gait (heel / toe ) normal.    Skin: Intact without suspicious lesions or rashes.  Lymph: No cervical, axillary, or inguinal lymphadenopathy present. Psych: Mood and affect are normal. 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

## 2011-02-25 ENCOUNTER — Encounter: Payer: Self-pay | Admitting: Gastroenterology

## 2011-02-25 ENCOUNTER — Other Ambulatory Visit: Payer: Self-pay | Admitting: Internal Medicine

## 2011-03-01 ENCOUNTER — Ambulatory Visit (AMBULATORY_SURGERY_CENTER): Payer: Medicare Other

## 2011-03-01 VITALS — Ht 70.0 in | Wt 198.5 lb

## 2011-03-01 DIAGNOSIS — Z86010 Personal history of colonic polyps: Secondary | ICD-10-CM

## 2011-03-01 MED ORDER — PEG-KCL-NACL-NASULF-NA ASC-C 100 G PO SOLR: 1.0000 | Freq: Once | ORAL | Status: AC

## 2011-03-16 ENCOUNTER — Encounter: Payer: Self-pay | Admitting: Gastroenterology

## 2011-03-16 ENCOUNTER — Ambulatory Visit (AMBULATORY_SURGERY_CENTER): Payer: Medicare Other | Admitting: Gastroenterology

## 2011-03-16 VITALS — HR 97 | Temp 97.5°F | Resp 18 | Ht 70.0 in | Wt 198.0 lb

## 2011-03-16 DIAGNOSIS — Z860100 Personal history of colon polyps, unspecified: Secondary | ICD-10-CM

## 2011-03-16 DIAGNOSIS — Z1211 Encounter for screening for malignant neoplasm of colon: Secondary | ICD-10-CM

## 2011-03-16 DIAGNOSIS — K635 Polyp of colon: Secondary | ICD-10-CM

## 2011-03-16 DIAGNOSIS — D126 Benign neoplasm of colon, unspecified: Secondary | ICD-10-CM

## 2011-03-16 MED ORDER — SODIUM CHLORIDE 0.9 % IV SOLN: 500.0000 mL | INTRAVENOUS | Status: DC

## 2011-03-16 NOTE — Patient Instructions (Signed)
Please refer to blue and green discharge instruction sheets. 

## 2011-03-17 ENCOUNTER — Telehealth: Payer: Self-pay | Admitting: *Deleted

## 2011-03-17 NOTE — Telephone Encounter (Signed)

## 2011-03-23 ENCOUNTER — Encounter: Payer: Self-pay | Admitting: Gastroenterology

## 2011-05-30 ENCOUNTER — Ambulatory Visit: Payer: Medicare Other | Admitting: Family

## 2011-06-06 ENCOUNTER — Other Ambulatory Visit: Payer: Self-pay | Admitting: Orthopaedic Surgery

## 2011-06-06 DIAGNOSIS — R9389 Abnormal findings on diagnostic imaging of other specified body structures: Secondary | ICD-10-CM

## 2011-06-10 ENCOUNTER — Ambulatory Visit
Admission: RE | Admit: 2011-06-10 | Discharge: 2011-06-10 | Disposition: A | Discharge status: Home / Self Care | Payer: Medicare Other | Source: Ambulatory Visit | Attending: Orthopaedic Surgery | Admitting: Orthopaedic Surgery

## 2011-06-10 DIAGNOSIS — R9389 Abnormal findings on diagnostic imaging of other specified body structures: Secondary | ICD-10-CM

## 2011-06-15 ENCOUNTER — Ambulatory Visit (INDEPENDENT_AMBULATORY_CARE_PROVIDER_SITE_OTHER): Payer: Medicare Other

## 2011-06-15 DIAGNOSIS — Z23 Encounter for immunization: Secondary | ICD-10-CM

## 2011-06-21 ENCOUNTER — Encounter: Payer: Self-pay | Admitting: Family Medicine

## 2011-06-21 ENCOUNTER — Ambulatory Visit (INDEPENDENT_AMBULATORY_CARE_PROVIDER_SITE_OTHER): Payer: Medicare Other | Admitting: Family Medicine

## 2011-06-21 DIAGNOSIS — E785 Hyperlipidemia, unspecified: Secondary | ICD-10-CM

## 2011-06-21 DIAGNOSIS — M549 Dorsalgia, unspecified: Secondary | ICD-10-CM

## 2011-06-21 DIAGNOSIS — R5383 Other fatigue: Secondary | ICD-10-CM

## 2011-06-21 DIAGNOSIS — E041 Nontoxic single thyroid nodule: Secondary | ICD-10-CM

## 2011-06-21 DIAGNOSIS — I10 Essential (primary) hypertension: Secondary | ICD-10-CM

## 2011-06-21 LAB — CBC WITH DIFFERENTIAL/PLATELET
Basophils Absolute: 0 10*3/uL (ref 0.0–0.1)
Eosinophils Relative: 3.6 % (ref 0.0–5.0)
HCT: 46.6 % (ref 39.0–52.0)
Hemoglobin: 16 g/dL (ref 13.0–17.0)
Lymphocytes Relative: 21.3 % (ref 12.0–46.0)
Lymphs Abs: 1.4 10*3/uL (ref 0.7–4.0)
Monocytes Relative: 10.6 % (ref 3.0–12.0)
Platelets: 147 10*3/uL — ABNORMAL LOW (ref 150.0–400.0)
WBC: 6.7 10*3/uL (ref 4.5–10.5)

## 2011-06-21 LAB — BASIC METABOLIC PANEL
BUN: 15 mg/dL (ref 6–23)
CO2: 29 mEq/L (ref 19–32)
Calcium: 9.4 mg/dL (ref 8.4–10.5)
GFR: 80.89 mL/min (ref >60.00)
Glucose, Bld: 101 mg/dL — ABNORMAL HIGH (ref 70–99)

## 2011-06-21 LAB — LIPID PANEL: Total CHOL/HDL Ratio: 5

## 2011-06-21 LAB — TSH: TSH: 0.68 u[IU]/mL (ref 0.35–5.50)

## 2011-06-21 NOTE — Patient Instructions (Signed)
You can get your results through our phone system.  Follow the instructions on the blue card. Come back for a recheck on your blood pressure in 2 weeks. OV.  Start back taking the pravastatin at night for cholesterol and take the blood pressure medicine (diltiazem) twice a day. We'll request your records.  Take care.

## 2011-06-21 NOTE — Progress Notes (Signed)
New pt to [facility].  Prev saw Dr. Alwyn Ren.    Hypertension:    Using medication without problems or lightheadedness: yes, but only taking meds qd, not bid Chest pain with exertion:no Edema:no Average home BPs:not checked  Fatigue noted by patient. TSH prev wnl earlier this year.  No known cause.   Gait changes, "I'm walking humped over."  Requesting records from ortho.  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  In PT now.    Meds, vitals, and allergies reviewed.   PMH and SH reviewed  ROS: See HPI.  Otherwise negative.    GEN: nad, alert and oriented, hard of hearing HEENT: mucous membranes moist NECK: supple w/o LA CV: rrr. PULM: ctab, no inc wob ABD: soft, +bs EXT: no edema SKIN: no acute rash He walks stooped over but doesn't have a foot drop, his gait is symmetric today Varicose veins noted, not ttp

## 2011-06-22 ENCOUNTER — Telehealth: Payer: Self-pay | Admitting: Family Medicine

## 2011-06-22 ENCOUNTER — Encounter: Payer: Self-pay | Admitting: Family Medicine

## 2011-06-22 DIAGNOSIS — M549 Dorsalgia, unspecified: Secondary | ICD-10-CM | POA: Insufficient documentation

## 2011-06-22 DIAGNOSIS — E7800 Pure hypercholesterolemia, unspecified: Secondary | ICD-10-CM

## 2011-06-22 NOTE — Assessment & Plan Note (Signed)
Requesting records, per [facility]

## 2011-06-22 NOTE — Assessment & Plan Note (Signed)
Take meds bid and then follow up for recheck

## 2011-06-22 NOTE — Assessment & Plan Note (Signed)
See notes on labs. Will follow.

## 2011-06-22 NOTE — Assessment & Plan Note (Signed)
Off statin, restart and follow.

## 2011-06-22 NOTE — Telephone Encounter (Signed)
See results on labs 

## 2011-06-22 NOTE — Assessment & Plan Note (Signed)
Plan to recheck u/s in 6 months.

## 2011-06-23 NOTE — Progress Notes (Signed)
Quick Note:  Left a message for pt to return call. ______ 

## 2011-07-13 ENCOUNTER — Encounter: Payer: Self-pay | Admitting: Family Medicine

## 2011-07-13 ENCOUNTER — Ambulatory Visit (INDEPENDENT_AMBULATORY_CARE_PROVIDER_SITE_OTHER): Payer: Medicare Other | Admitting: Family Medicine

## 2011-07-13 DIAGNOSIS — I10 Essential (primary) hypertension: Secondary | ICD-10-CM

## 2011-07-13 DIAGNOSIS — R1110 Vomiting, unspecified: Secondary | ICD-10-CM

## 2011-07-13 DIAGNOSIS — E785 Hyperlipidemia, unspecified: Secondary | ICD-10-CM

## 2011-07-13 DIAGNOSIS — E041 Nontoxic single thyroid nodule: Secondary | ICD-10-CM

## 2011-07-13 MED ORDER — ONDANSETRON HCL 4 MG PO TABS: 4.0000 mg | ORAL_TABLET | Freq: Three times a day (TID) | ORAL | Status: AC | PRN

## 2011-07-13 MED ORDER — PRAVASTATIN SODIUM 40 MG PO TABS: 20.0000 mg | ORAL_TABLET | Freq: Every day | ORAL | Status: DC

## 2011-07-13 NOTE — Patient Instructions (Signed)
Come back for fasting labs in the middle of 2/13. You can get your results through our phone system.  Follow the instructions on the blue card. Don't change your BP meds and take the zofran if needed for vomiting.  This should get better.  Drink sips of fluid in the meantime.   Take care.  Glad to see you.

## 2011-07-13 NOTE — Progress Notes (Signed)
Neck pain is better after therapy.    Hypertension:    Using medication without problems or lightheadedness: yes Chest pain with exertion:no Edema:no Short of breath:no Average home BPs: controlled on outstide checks.  Improved BP on bid dosing of CCB.    He started back on statin and didn't have trouble with that.  No other aches.  Labs d/w pt.   3 days of vomiting, diarrhea.  No blood in stool.  No fevers known.  Some abd discomfort but this is better.  H/o diverticulitis.  No vomiting today.  Dec in appetite.  Drinking some, tried some diet coke and coffee.  No others with GI sx.  Today is some better- no vomiting.    Meds, vitals, and allergies reviewed.   PMH and SH reviewed  ROS: See HPI.  Otherwise negative.    GEN: nad, alert and oriented HEENT: mucous membranes moist NECK: supple w/o LA, rom improved from prev CV: rrr. PULM: ctab, no inc wob ABD: soft, +bs, no masses EXT: no edema SKIN: no acute rash

## 2011-07-14 ENCOUNTER — Encounter: Payer: Self-pay | Admitting: Family Medicine

## 2011-07-14 DIAGNOSIS — R1110 Vomiting, unspecified: Secondary | ICD-10-CM | POA: Insufficient documentation

## 2011-07-14 NOTE — Assessment & Plan Note (Signed)
Likely viral AGE that is resolving with benign exam, zofran prn and f/u prn.  Sips of fluids in meantime.  He agrees.

## 2011-07-14 NOTE — Assessment & Plan Note (Signed)
Improved, no change in meds 

## 2011-07-14 NOTE — Assessment & Plan Note (Signed)
Cont current meds, return for labs.

## 2011-07-14 NOTE — Assessment & Plan Note (Signed)
D/w pt re: f/u u/s for nodule later in 2013.

## 2011-08-19 ENCOUNTER — Other Ambulatory Visit (INDEPENDENT_AMBULATORY_CARE_PROVIDER_SITE_OTHER): Payer: Medicare Other

## 2011-08-19 DIAGNOSIS — E7800 Pure hypercholesterolemia, unspecified: Secondary | ICD-10-CM

## 2011-08-19 LAB — LIPID PANEL
Cholesterol: 134 mg/dL (ref 0–200)
HDL: 40.5 mg/dL (ref >39.00)
LDL Cholesterol: 71 mg/dL (ref 0–99)
VLDL: 22.2 mg/dL (ref 0.0–40.0)

## 2011-11-08 ENCOUNTER — Encounter: Payer: Self-pay | Admitting: Family Medicine

## 2011-11-08 ENCOUNTER — Ambulatory Visit (INDEPENDENT_AMBULATORY_CARE_PROVIDER_SITE_OTHER): Payer: Medicare Other | Admitting: Family Medicine

## 2011-11-08 VITALS — BP 156/84 | HR 90 | Temp 97.6°F | Wt 201.8 lb

## 2011-11-08 DIAGNOSIS — R0989 Other specified symptoms and signs involving the circulatory and respiratory systems: Secondary | ICD-10-CM

## 2011-11-08 NOTE — Patient Instructions (Signed)
See Shirlee Limerick about your referral before you leave today. We'll notify you after the dopplers are done and reviewed.

## 2011-11-08 NOTE — Progress Notes (Signed)
"  I have trouble walking."  Can 50 feet and legs feel normal.  After 50 feet, his legs feel weak.  He can have leg pain, but it is rare.  Weakness is in both legs, but the left leg gets weaker sooner.  If he stops and rests, it gets better.   Often his legs are swollen and purple, not every day but often, usually L>R.  His legs had been itchy, worse at night.   He gets SOB walking, going on for last year.  No CP.   He says his balance is off, but he doesn't report room spinning.  When he's walking, he's stooped over more than prev and that may affect his balance.    No h/o CAD, MI, CVA.  No change in speech.  L arm is numb is from a pinched nerve in neck but this isn't new.  No other focal neuro symptoms.    Meds, vitals, and allergies reviewed.   ROS: See HPI.  Otherwise, noncontributory.  nad ncat Mmm rrr ctab abd soft, not ttp Ext with trace edema, 1+ DP pulse B Normal gait initially but L leg/calf gets fatigued quicker than the R and he doesn't pick the foot up as high at that point.  Improved with rest.  Happens after about 40-50 feet of walking

## 2011-11-09 DIAGNOSIS — R0989 Other specified symptoms and signs involving the circulatory and respiratory systems: Secondary | ICD-10-CM | POA: Insufficient documentation

## 2011-11-09 NOTE — Assessment & Plan Note (Signed)
Refer for arterial dopplers and then address at that point.  Continue other meds in meantime.  He agrees.  He is aware that PAD/PVD is highly likely. Okay for outpatient fu with no skin breakdown or ulceration.

## 2011-11-15 ENCOUNTER — Other Ambulatory Visit: Payer: Self-pay | Admitting: *Deleted

## 2011-11-15 ENCOUNTER — Other Ambulatory Visit: Payer: Self-pay

## 2011-11-15 ENCOUNTER — Encounter (INDEPENDENT_AMBULATORY_CARE_PROVIDER_SITE_OTHER): Payer: Medicare Other

## 2011-11-15 ENCOUNTER — Other Ambulatory Visit: Payer: Self-pay | Admitting: Internal Medicine

## 2011-11-15 DIAGNOSIS — I70219 Atherosclerosis of native arteries of extremities with intermittent claudication, unspecified extremity: Secondary | ICD-10-CM

## 2011-11-15 DIAGNOSIS — R0989 Other specified symptoms and signs involving the circulatory and respiratory systems: Secondary | ICD-10-CM

## 2011-11-15 DIAGNOSIS — I739 Peripheral vascular disease, unspecified: Secondary | ICD-10-CM

## 2011-11-15 MED ORDER — DILTIAZEM HCL 120 MG PO TABS: 120.0000 mg | ORAL_TABLET | Freq: Two times a day (BID) | ORAL | Status: DC

## 2011-11-15 MED ORDER — OMEPRAZOLE 20 MG PO CPDR: 20.0000 mg | DELAYED_RELEASE_CAPSULE | Freq: Every day | ORAL | Status: DC

## 2011-11-15 NOTE — Telephone Encounter (Signed)
pts wife said not sure if pt needs refill on BP med. Mrs Fuqua will check with pharmacy and if pt needs refill she will ask pharmacy to contact our office.

## 2011-11-16 ENCOUNTER — Telehealth: Payer: Self-pay | Admitting: Family Medicine

## 2011-11-16 DIAGNOSIS — I739 Peripheral vascular disease, unspecified: Secondary | ICD-10-CM | POA: Insufficient documentation

## 2011-11-16 NOTE — Telephone Encounter (Signed)
Left message on machine to call back  

## 2011-11-16 NOTE — Telephone Encounter (Signed)
Call pt.  Lower ext dopplers showed some restriction in the vessels.  I think this explains his symptoms.  I would talk to the vascular clinic about this.  Referral is in.  Thanks.

## 2011-11-17 NOTE — Telephone Encounter (Signed)
Patient notified as instructed by telephone. Asher Muir pt care coordinator spoke with pt.

## 2011-12-21 ENCOUNTER — Ambulatory Visit
Admission: RE | Admit: 2011-12-21 | Discharge: 2011-12-21 | Disposition: A | Discharge status: Home / Self Care | Payer: Medicare Other | Source: Ambulatory Visit | Attending: Family Medicine | Admitting: Family Medicine

## 2011-12-21 DIAGNOSIS — E041 Nontoxic single thyroid nodule: Secondary | ICD-10-CM

## 2011-12-22 ENCOUNTER — Encounter: Payer: Self-pay | Admitting: Vascular Surgery

## 2011-12-23 ENCOUNTER — Ambulatory Visit (INDEPENDENT_AMBULATORY_CARE_PROVIDER_SITE_OTHER): Payer: Medicare Other | Admitting: Vascular Surgery

## 2011-12-23 ENCOUNTER — Encounter: Payer: Self-pay | Admitting: Vascular Surgery

## 2011-12-23 VITALS — BP 178/79 | HR 72 | Temp 97.9°F | Ht 70.0 in | Wt 199.0 lb

## 2011-12-23 DIAGNOSIS — I70219 Atherosclerosis of native arteries of extremities with intermittent claudication, unspecified extremity: Secondary | ICD-10-CM

## 2011-12-23 DIAGNOSIS — M79606 Pain in leg, unspecified: Secondary | ICD-10-CM

## 2011-12-23 DIAGNOSIS — M79609 Pain in unspecified limb: Secondary | ICD-10-CM

## 2011-12-23 NOTE — Progress Notes (Signed)
[facility] [place]  Referred by:  Joaquim Nam, MD [address] [place], [place] [address]  Reason for referral: L>R leg pain  History of Present Illness  [name] is a 71 y.o. (August 16, 1940) male who presents with chief complaint: bilateral leg pain (L>R).  Onset of symptom occurred ~one year ago.  Pain is described as heaviness with intermittent cramping in calf which worsens with extended walking, severity 3-6/10, and associated with ambulation and standing.  Wife also has noticed the left leg becomes red throughout.  The patient also notes aching sensation from foot all the way up to the hip occasionally.  Patient has attempted to treat this pain with rest and OTC rx.  The patient has no rest pain symptoms also and no leg wounds/ulcers.  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  Atherosclerotic risk factors include: hyperlipidemia, HTN, and prior smoking.  Past Medical History  Diagnosis Date  . Hyperlipidemia   . Hypertension   . OAD (obstructive airway disease)   . GERD (gastroesophageal reflux disease)   . Diverticulosis   . DDD (degenerative disc disease)     cervical, followed by [facility] 2012  . ED (erectile dysfunction)     no relief with cialis, etc  . Back pain     per [facility]  . Thyroid nodule   . Anemia   . Irregular heartbeat     Past Surgical History  Procedure Date  . Small intestine surgery   . Skin graft   . Tonsillectomy   . Polypectomy   . Colonoscopy     History   Social History  . Marital Status: Married    Spouse Name: N/A    Number of Children: N/A  . Years of Education: N/A   Occupational History  . Not on file.   Social History Main Topics  . Smoking status: Former Smoker    Types: Cigarettes    Quit date: 02/28/1982  . Smokeless tobacco: Never Used   Comment: Quit in 40's   . Alcohol Use: 7.2 oz/week    12 Cans of beer per week     two 6 pack a week of beer  . Drug Use: No  .  Sexually Active: Not on file   Other Topics Concern  . 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 Heart attack Mother   . Heart disease Mother   . Hyperlipidemia Mother   . Stroke Father   . Heart attack Father   . Heart disease Father   . Hyperlipidemia Father   . Hypertension Father   . Other Father     varicose veins  . Heart attack Brother   . Prostate cancer Brother   . Cancer Brother   . Other Brother     varicose veins  . Heart attack Brother   . Heart disease Brother   . Hyperlipidemia Brother   . Other Brother     varicose veins  . Breast cancer Sister   . Diabetes Sister   . Cancer Sister   . Heart disease Brother   . Hyperlipidemia Brother   . Hypertension Brother   . Heart attack Brother     Current Outpatient Prescriptions on File Prior to Visit  Medication Sig Dispense Refill  .  aspirin 81 MG EC tablet Take 81 mg by mouth daily.        Marland Kitchen diltiazem (CARDIZEM) 120 MG tablet Take 1 tablet (120 mg total) by mouth 2 (two) times daily.  180 tablet  3  . ibuprofen (ADVIL,MOTRIN) 200 MG tablet Take 200 mg by mouth every 6 (six) hours as needed.        Marland Kitchen omeprazole (PRILOSEC) 20 MG capsule Take 1 capsule (20 mg total) by mouth daily.  90 capsule  3  . pravastatin (PRAVACHOL) 40 MG tablet Take 0.5 tablets (20 mg total) by mouth daily.  45 tablet  3    Allergies  Allergen Reactions  . Benazepril Hcl   . Hydrochlorothiazide     REACTION: joint pain     Review of Systems (Positive items checked otherwise negative)  General: [ ]  Weight loss, [ ]  Weight gain, [ ]   Loss of appetite, [ ]  Fever  Neurologic: [ ]  Dizziness, [ ]  Blackouts, [ ]  Headaches, [ ]  Seizure  Ear/Nose/Throat: [ ]  Change in eyesight, [ ]  Change in hearing, [ ]  Nose bleeds, [ ]  Sore throat  Vascular: [ ]  Pain in legs with walking, [ ]  Pain in feet while  lying flat, [ ]  Non-healing ulcer, [ ]  Stroke, [ ]  "Mini stroke", [ ]  Slurred speech, [ ]  Temporary blindness, [ ]  Blood clot in vein, [ ]  Phlebitis  Pulmonary: [ ]  Home oxygen, [ ]  Productive cough, [ ]  Bronchitis, [ ]  Coughing up blood, [ ]  Asthma, [ ]  Wheezing  Musculoskeletal: [ ]  Arthritis, [ ]  Joint pain, [ ]  Muscle pain  Cardiac: [ ]  Chest pain, [ ]  Chest tightness/pressure, [ ]  Shortness of breath when lying flat, [ ]  Shortness of breath with exertion, [ ]  Palpitations, [ ]  Heart murmur, [ ]  Arrythmia, [ ]  Atrial fibrillation  Hematologic: [ ]  Bleeding problems, [ ]  Clotting disorder, [ ]  Anemia  Psychiatric:  [ ]  Depression, [ ]  Anxiety, [ ]  Attention deficit disorder  Gastrointestinal:  [ ]  Black stool,[ ]   Blood in stool, [ ]  Peptic ulcer disease, [ ]  Reflux, [ ]  Hiatal hernia, [ ]  Trouble swallowing, [ ]  Diarrhea, [ ]  Constipation  Urinary:  [ ]  Kidney disease, [ ]  Burning with urination, [ ]  Frequent urination, [ ]  Difficulty urinating  Skin: [ ]  Ulcers, [ ]  Rashes  Physical Examination  Filed Vitals:   12/23/11 0915  BP: 178/79  Pulse: 72  Temp: 97.9 F (36.6 C)  TempSrc: Oral  Height: 5\' 10"  (1.778 m)  Weight: 199 lb (90.266 kg)  SpO2: 97%   Body mass index is 28.55 kg/(m^2).  General: A&O x 3, WDWN  Head: [place]/AT  Ear/Nose/Throat: Hearing grossly intact, nares w/o erythema or drainage, oropharynx w/o Erythema/Exudate  Eyes: PERRLA, EOMI  Neck: Supple, no nuchal rigidity, no palpable LAD  Pulmonary: Sym exp, good air movt, CTAB, no rales, rhonchi, & wheezing  Cardiac: RRR, Nl S1, S2, no Murmurs, rubs or gallops  Vascular: Vessel Right Left  Radial Palpable Palpable  Brachial Palpable Palpable  Carotid Palpable, without bruit Palpable, without bruit  Aorta Non-palpable due to obesity N/A  Femoral Palpable Palpable  Popliteal Non-palpable Non-palpable  PT Non-Palpable Non-Palpable  DP Non-Palpable Non-Palpable   Gastrointestinal: soft, NTND, -G/R,  - HSM, - masses, - CVAT B, somewhat obese  Musculoskeletal: M/S 5/5 throughout , Extremities without ischemic changes , L>R leg obvious varicosities, some LDS in both legs (L>R)  Neurologic: CN 2-12 intact , Pain  and light touch intact in extremities , Motor exam as listed above  Psychiatric: Judgment intact, Mood & affect appropriatefor pt's clinical situation  Dermatologic: See M/S exam for extremity exam, no rashes otherwise noted  Lymph : No Cervical, Axillary, or Inguinal lymphadenopathy   Outside Studies/Documentation 5 pages of outside documents were reviewed including: outside facility ABI which demonstrated R ABI 0.89, L ABI 0.85.  In my opinion, he has triphasic femoral an popliteal arteries and his tibials are actually biphasic not monophasic as documented.  R TBI 0.37, L TBI 0.52 .  Medical Decision Making  [name] is a 71 y.o. male who presents with: atypical BLE intermittent claudication, CVI (C3).   This pt's sx have elements of IC and chronic venous insufficiency (CVI) but there are also elements that are not consistent with either.  I recommended: exercise ABI to try to reveal any underlying PAD and BLE venous reflux insufficiency duplex to evaluate the venous system.  If the pt's PAD is minimal as I suspect, compression stockings would be indicated +/- endovenous ablation in 3 months.  We will arrange for these studies in the next 2-4 weeks and he will follow up after those studies are arranged.  I discussed with the patient the natural history of intermittent claudication: 75% of patients have stable or improved symptoms in a year an only 2% require amputation. Eventually 20% may require intervention in a year.  I discussed in depth with the patient the nature of atherosclerosis, and emphasized the importance of maximal medical management including strict control of blood pressure, blood glucose, and lipid levels, antiplatelet agent, obtaining regular  exercise, and cessation of smoking.    The patient is aware that without maximal medical management the underlying atherosclerotic disease process will progress, limiting the benefit of any interventions.  I discussed in depth with the patient a walking plan and how to execute such.  The patient is not interested in starting Pletal.  Thank you for allowing [place] to participate in this patient's care.  Leonides Sake, MD [facility] [place] Office: [phone] Pager: [phone]  12/23/2011, 10:07 AM

## 2012-01-19 ENCOUNTER — Encounter: Payer: Self-pay | Admitting: Vascular Surgery

## 2012-01-20 ENCOUNTER — Encounter: Payer: Self-pay | Admitting: Vascular Surgery

## 2012-01-20 ENCOUNTER — Ambulatory Visit (INDEPENDENT_AMBULATORY_CARE_PROVIDER_SITE_OTHER): Payer: Medicare Other | Admitting: Vascular Surgery

## 2012-01-20 ENCOUNTER — Encounter (INDEPENDENT_AMBULATORY_CARE_PROVIDER_SITE_OTHER): Payer: Medicare Other | Admitting: *Deleted

## 2012-01-20 VITALS — BP 177/99 | HR 72 | Resp 20 | Ht 70.0 in | Wt 196.0 lb

## 2012-01-20 DIAGNOSIS — M79606 Pain in leg, unspecified: Secondary | ICD-10-CM

## 2012-01-20 DIAGNOSIS — I70219 Atherosclerosis of native arteries of extremities with intermittent claudication, unspecified extremity: Secondary | ICD-10-CM

## 2012-01-20 DIAGNOSIS — I739 Peripheral vascular disease, unspecified: Secondary | ICD-10-CM

## 2012-01-20 DIAGNOSIS — M7989 Other specified soft tissue disorders: Secondary | ICD-10-CM

## 2012-01-20 NOTE — Progress Notes (Signed)
[facility] [place]  Established Intermittent Claudication  History of Present Illness  [name] is a 71 y.o. ([birth year]) male who presents with chief complaint: short distance claudication.  The patient's symptoms have progressed.  The patient's symptoms are: cramping in bilateral calves even with <50 yard ambulation.   The patient's treatment regimen currently included: maximal medical management.  He denies any rest pain or wounds.  He returns for non-invasive studies.  Past Medical History, Past Surgical History, Social History, Family History, Medications, Allergies, and Review of Systems are unchanged from previous evaluation on 12/23/11.  Physical Examination  Filed Vitals:   01/20/12 1325  BP: 177/99  Pulse: 72  Resp: 20  Height: 5\' 10"  (1.778 m)  Weight: 196 lb (88.905 kg)   Body mass index is 28.12 kg/(m^2).  General: A&O x 3, WDWN  Pulmonary: Sym exp, good air movt, CTAB, no rales, rhonchi, & wheezing  Cardiac: RRR, Nl S1, S2, no Murmurs, rubs or gallops  Vascular: Vessel Right Left  Radial Palpable Palpable  Brachial Palpable Palpable  Carotid Palpable, without bruit Palpable, without bruit  Aorta Non-palpable N/A  Femoral Palpable Palpable  Popliteal Non-palpable Non-palpable  PT Palpable Non-Palpable  DP Non-Palpable Non-Palpable   Musculoskeletal: M/S 5/5 throughout , Extremities without ischemic changes , BLE extensive varicosities and spider veins in both legs  Neurologic: Pain and light touch intact in extremities , Motor exam as listed above  Non-Invasive Vascular Imaging ABI w/ exercise (Date: 01/20/12)  RLE: 0.93, 0.64 after exercise, PT: biphasic, DP: monophasic  LLE: 0.83, 0.71 after exercise, PT: monophasic, DP: monophasic  BLE Venous Reflux Duplex (Date: 01/20/12)  RLE: superficial and mild deep system reflux  LLE: superficial and mild deep system reflux, L SSV reflux  Medical Decision Making  [name]  is a 71 y.o. male who presents with: B leg intermittent claudication without evidence of critical limb ischemia, BLE chronic venous insufficiency (C4)  Today's sx are more consistent with intermittent claudication.  The patient's sx are lifestyle limiting given the short distances he can ambulate pain free, subsequently I offered the patient: Aortogram, Bilateral leg runoff, and possible intervention.  We will be scheduled within the next two weeks depending on PV lab availability.   I discussed with the patient the nature of angiographic procedures, especially the limited patencies of any endovascular intervention.  The patient is aware of that the risks of an angiographic procedure include but are not limited to: bleeding, infection, access site complications, renal failure, embolization, rupture of vessel, dissection, possible need for emergent surgical intervention, possible need for surgical procedures to treat the patient's pathology, and stroke and death.   The patient is aware of the risks and agrees to proceed.  I discussed in depth with the patient the nature of atherosclerosis, and emphasized the importance of maximal medical management including strict control of blood pressure, blood glucose, and lipid levels, antiplatelet agents, obtaining regular exercise, and cessation of smoking.  The patient is aware that without maximal medical management the underlying atherosclerotic disease process will progress, limiting the benefit of any interventions.  Thank you for allowing [place] to participate in this patient's care.  Leonides Sake, MD [facility] [place] Office: [phone] Pager: [phone]  01/20/2012, 5:38 PM

## 2012-01-23 ENCOUNTER — Encounter (HOSPITAL_COMMUNITY): Payer: Self-pay | Admitting: Pharmacy Technician

## 2012-01-23 ENCOUNTER — Other Ambulatory Visit: Payer: Self-pay

## 2012-01-24 ENCOUNTER — Other Ambulatory Visit: Payer: Self-pay

## 2012-01-24 DIAGNOSIS — R600 Localized edema: Secondary | ICD-10-CM

## 2012-01-24 NOTE — Procedures (Unsigned)
LOWER EXTREMITY VENOUS REFLUX EXAM  INDICATION:  Varicose veins.  EXAM:  Using color-flow imaging and pulse Doppler spectral analysis, the bilateral common femoral, femoral, popliteal, posterior tibial, great and small saphenous veins are evaluated.  There is evidence suggesting deep venous insufficiency in the bilateral lower extremities.  The bilateral saphenofemoral junction is not competent with reflux of >500 milliseconds. The bilateral great saphenous vein is not competent with reflux of >500 milliseconds with the caliber as described below.   The left small saphenous vein demonstrates incompetency.  GSV Diameter (used if found to be incompetent only)                                           Right    Left Proximal Greater Saphenous Vein           0.53 cm  1.55 cm Proximal-to-mid-thigh                     0.45 cm  0.78 cm Mid thigh                                 0.43 cm  0.65 cm Mid-distal thigh                          0.46 cm  0.59 cm Distal thigh                              0.34 cm  1.05 cm Knee                                      0.28 cm  0.54 cm  IMPRESSION: 1. The bilateral greater saphenous vein is not competent with reflux     with of >500 milliseconds. 2. The bilateral greater saphenous vein is not tortuous. 3. The deep venous system is mildly incompetent bilaterally with     reflux of >500 milliseconds. 4. The left small saphenous vein is not competent with reflux of >500     milliseconds. 5. No evidence of DVT bilaterally.  ___________________________________________  SS/MEDQ  D:  01/20/2012  T:  01/20/2012  Job:  [id number]

## 2012-02-01 MED ORDER — SODIUM CHLORIDE 0.9 % IV SOLN
INTRAVENOUS | Status: DC
  Administered 2012-02-02: 1000 mL via INTRAVENOUS

## 2012-02-02 ENCOUNTER — Ambulatory Visit (HOSPITAL_COMMUNITY)
Admission: RE | Admit: 2012-02-02 | Discharge: 2012-02-02 | Disposition: A | Discharge status: Home / Self Care | Payer: Medicare Other | Source: Ambulatory Visit | Attending: Vascular Surgery | Admitting: Vascular Surgery

## 2012-02-02 ENCOUNTER — Telehealth: Payer: Self-pay | Admitting: Vascular Surgery

## 2012-02-02 ENCOUNTER — Encounter (HOSPITAL_COMMUNITY)
Admission: RE | Disposition: A | Discharge status: Home / Self Care | Payer: Self-pay | Source: Ambulatory Visit | Attending: Vascular Surgery

## 2012-02-02 DIAGNOSIS — Z23 Encounter for immunization: Secondary | ICD-10-CM

## 2012-02-02 DIAGNOSIS — Z79899 Other long term (current) drug therapy: Secondary | ICD-10-CM

## 2012-02-02 DIAGNOSIS — I70219 Atherosclerosis of native arteries of extremities with intermittent claudication, unspecified extremity: Secondary | ICD-10-CM | POA: Diagnosis present

## 2012-02-02 DIAGNOSIS — K56609 Unspecified intestinal obstruction, unspecified as to partial versus complete obstruction: Principal | ICD-10-CM | POA: Diagnosis present

## 2012-02-02 DIAGNOSIS — Z7982 Long term (current) use of aspirin: Secondary | ICD-10-CM

## 2012-02-02 DIAGNOSIS — Z823 Family history of stroke: Secondary | ICD-10-CM

## 2012-02-02 DIAGNOSIS — E785 Hyperlipidemia, unspecified: Secondary | ICD-10-CM | POA: Diagnosis present

## 2012-02-02 DIAGNOSIS — N1330 Unspecified hydronephrosis: Secondary | ICD-10-CM | POA: Diagnosis present

## 2012-02-02 DIAGNOSIS — I739 Peripheral vascular disease, unspecified: Secondary | ICD-10-CM | POA: Insufficient documentation

## 2012-02-02 DIAGNOSIS — K5730 Diverticulosis of large intestine without perforation or abscess without bleeding: Secondary | ICD-10-CM | POA: Diagnosis present

## 2012-02-02 DIAGNOSIS — Z8249 Family history of ischemic heart disease and other diseases of the circulatory system: Secondary | ICD-10-CM

## 2012-02-02 DIAGNOSIS — K219 Gastro-esophageal reflux disease without esophagitis: Secondary | ICD-10-CM | POA: Insufficient documentation

## 2012-02-02 DIAGNOSIS — I10 Essential (primary) hypertension: Secondary | ICD-10-CM | POA: Insufficient documentation

## 2012-02-02 DIAGNOSIS — Z7902 Long term (current) use of antithrombotics/antiplatelets: Secondary | ICD-10-CM

## 2012-02-02 DIAGNOSIS — Z87891 Personal history of nicotine dependence: Secondary | ICD-10-CM

## 2012-02-02 DIAGNOSIS — M5030 Other cervical disc degeneration, unspecified cervical region: Secondary | ICD-10-CM | POA: Diagnosis present

## 2012-02-02 DIAGNOSIS — N138 Other obstructive and reflux uropathy: Secondary | ICD-10-CM | POA: Diagnosis present

## 2012-02-02 DIAGNOSIS — I708 Atherosclerosis of other arteries: Secondary | ICD-10-CM | POA: Insufficient documentation

## 2012-02-02 DIAGNOSIS — R339 Retention of urine, unspecified: Secondary | ICD-10-CM | POA: Diagnosis present

## 2012-02-02 DIAGNOSIS — N401 Enlarged prostate with lower urinary tract symptoms: Secondary | ICD-10-CM | POA: Diagnosis present

## 2012-02-02 HISTORY — PX: AORTOGRAM: SHX6300

## 2012-02-02 HISTORY — PX: ABDOMINAL AORTAGRAM: SHX5454

## 2012-02-02 HISTORY — PX: ILIAC ARTERY STENT: SHX1786

## 2012-02-02 LAB — POCT I-STAT, CHEM 8
Calcium, Ion: 1.27 mmol/L (ref 1.13–1.30)
Creatinine, Ser: 1 mg/dL (ref 0.50–1.35)
Glucose, Bld: 108 mg/dL — ABNORMAL HIGH (ref 70–99)
HCT: 49 % (ref 39.0–52.0)
Hemoglobin: 16.7 g/dL (ref 13.0–17.0)
Potassium: 4 mEq/L (ref 3.5–5.1)
TCO2: 25 mmol/L (ref 0–100)

## 2012-02-02 LAB — POCT ACTIVATED CLOTTING TIME
Activated Clotting Time: 234 seconds
Activated Clotting Time: 169 seconds

## 2012-02-02 SURGERY — ABDOMINAL AORTAGRAM
Anesthesia: LOCAL | Laterality: Right

## 2012-02-02 MED ORDER — LABETALOL HCL 5 MG/ML IV SOLN
INTRAVENOUS | Status: AC
  Filled 2012-02-02: qty 4

## 2012-02-02 MED ORDER — HEPARIN (PORCINE) IN NACL 2-0.9 UNIT/ML-% IJ SOLN
INTRAMUSCULAR | Status: AC
  Filled 2012-02-02: qty 1000

## 2012-02-02 MED ORDER — LIDOCAINE HCL (PF) 1 % IJ SOLN
INTRAMUSCULAR | Status: AC
  Filled 2012-02-02: qty 30

## 2012-02-02 MED ORDER — SODIUM CHLORIDE 0.9 % IV SOLN: 1.0000 mL/kg/h | INTRAVENOUS | Status: DC

## 2012-02-02 MED ORDER — FENTANYL CITRATE 0.05 MG/ML IJ SOLN
INTRAMUSCULAR | Status: AC
  Filled 2012-02-02: qty 2

## 2012-02-02 MED ORDER — OXYCODONE-ACETAMINOPHEN 5-325 MG PO TABS: 1.0000 | ORAL_TABLET | ORAL | Status: DC | PRN

## 2012-02-02 MED ORDER — HEPARIN SODIUM (PORCINE) 1000 UNIT/ML IJ SOLN
INTRAMUSCULAR | Status: AC
  Filled 2012-02-02: qty 1

## 2012-02-02 MED ORDER — HYDRALAZINE HCL 20 MG/ML IJ SOLN
INTRAMUSCULAR | Status: AC
  Filled 2012-02-02: qty 1

## 2012-02-02 MED ORDER — CLOPIDOGREL BISULFATE 75 MG PO TABS: 75.0000 mg | ORAL_TABLET | Freq: Every day | ORAL | Status: DC

## 2012-02-02 MED ORDER — ONDANSETRON HCL 4 MG/2ML IJ SOLN: 4.0000 mg | Freq: Four times a day (QID) | INTRAMUSCULAR | Status: DC | PRN

## 2012-02-02 MED ORDER — HYDRALAZINE HCL 20 MG/ML IJ SOLN
10.0000 mg | INTRAMUSCULAR | Status: DC | PRN
  Administered 2012-02-02: 10 mg via INTRAVENOUS
  Filled 2012-02-02 (×2): qty 1

## 2012-02-02 MED ORDER — ACETAMINOPHEN 325 MG PO TABS: 650.0000 mg | ORAL_TABLET | ORAL | Status: DC | PRN

## 2012-02-02 MED ORDER — MIDAZOLAM HCL 2 MG/2ML IJ SOLN
INTRAMUSCULAR | Status: AC
  Filled 2012-02-02: qty 2

## 2012-02-02 MED ORDER — CLOPIDOGREL BISULFATE 300 MG PO TABS: 300.0000 mg | ORAL_TABLET | Freq: Once | ORAL | Status: DC

## 2012-02-02 MED ORDER — MORPHINE SULFATE 4 MG/ML IJ SOLN: 2.0000 mg | INTRAMUSCULAR | Status: DC | PRN

## 2012-02-02 NOTE — Telephone Encounter (Signed)
Message copied by Fredrich Birks on Thu Feb 02, 2012  3:04 PM ------      Message from: Melene Plan      Created: Thu Feb 02, 2012  2:01 PM                   ----- Message -----         From: Fransisco Hertz, MD         Sent: 02/02/2012  11:14 AM           To: Reuel Derby, Melene Plan, RN            CYLAS FALZONE      [phone]      [birth year]            PROCEDURE:      1.  Right common femoral artery cannulation under ultrasound guidance      2.  Aortogram      3.  Bilateral leg runoff      4.  Stenting and angioplasty of right common iliac artery  (Viabahn 7 mm x 50 mm, 7 mmx 60 mm)            Follow-up: 4 weeks

## 2012-02-02 NOTE — Telephone Encounter (Signed)
Patient notified of appt via voicemail, dpm

## 2012-02-02 NOTE — H&P (Signed)
[facility] [place]  Brief History and Physical  History of Present Illness  [name] is a 71 y.o. male who presents with chief complaint: bilateral short distance claudication.  The patient presents today for Aortogram, bilateral leg runoff, and possible intervention.    Past Medical History  Diagnosis Date  . Hyperlipidemia   . Hypertension   . OAD (obstructive airway disease)   . GERD (gastroesophageal reflux disease)   . Diverticulosis   . DDD (degenerative disc disease)     cervical, followed by [facility] 2012  . ED (erectile dysfunction)     no relief with cialis, etc  . Back pain     per [facility]  . Thyroid nodule   . Anemia   . Irregular heartbeat     Past Surgical History  Procedure Date  . Small intestine surgery   . Skin graft   . Tonsillectomy   . Polypectomy   . Colonoscopy     History   Social History  . Marital Status: Married    Spouse Name: N/A    Number of Children: N/A  . Years of Education: N/A   Occupational History  . Not on file.   Social History Main Topics  . Smoking status: Former Smoker    Types: Cigarettes    Quit date: 02/28/1982  . Smokeless tobacco: Never Used   Comment: Quit in 40's   . Alcohol Use: 7.2 oz/week    12 Cans of beer per week     two 6 pack a week of beer  . Drug Use: No  . Sexually Active: Not on file   Other Topics Concern  . 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 Heart attack Mother   . Heart disease Mother   . Hyperlipidemia Mother   . Stroke Father   . Heart attack Father   . Heart disease Father   . Hyperlipidemia Father   . Hypertension Father   . Other Father     varicose veins  . Heart attack Brother   . Prostate cancer Brother   . Cancer Brother   . Other Brother     varicose veins  . Heart attack Brother   . Heart disease  Brother   . Hyperlipidemia Brother   . Other Brother     varicose veins  . Breast cancer Sister   . Diabetes Sister   . Cancer Sister   . Heart disease Brother   . Hyperlipidemia Brother   . Hypertension Brother   . Heart attack Brother     No current facility-administered medications on file prior to encounter.   Current Outpatient Prescriptions on File Prior to Encounter  Medication Sig Dispense Refill  . aspirin 81 MG EC tablet Take 81 mg by mouth daily.        Marland Kitchen diltiazem (CARDIZEM) 120 MG tablet Take 1 tablet (120 mg total) by mouth 2 (two) times daily.  180 tablet  3  . omeprazole (PRILOSEC) 20 MG capsule Take 1 capsule (20 mg total) by mouth daily.  90 capsule  3  . pravastatin (PRAVACHOL) 40 MG tablet Take 0.5 tablets (20 mg total) by mouth daily.  45 tablet  3  . Tamsulosin HCl (FLOMAX) 0.4 MG CAPS Take 0.4 mg by mouth daily.  Allergies  Allergen Reactions  . Benazepril Hcl Other (See Comments)    unknown  . Hydrochlorothiazide     REACTION: joint pain    Review of Systems: As listed above, otherwise negative.  Physical Examination  Filed Vitals:   02/02/12 0605  BP: 177/99  Pulse: 101  Temp: 97 F (36.1 C)  TempSrc: Oral  Resp: 20  Height: 5\' 9"  (1.753 m)  Weight: 198 lb (89.812 kg)  SpO2: 96%    General: A&O x 3, WDWN  Pulmonary: Sym exp, good air movt, CTAB, no rales, rhonchi, & wheezing  Cardiac: RRR, Nl S1, S2, no Murmurs, rubs or gallops  Gastrointestinal: soft, NTND, -G/R, - HSM, - masses, - CVAT B  Musculoskeletal: M/S 5/5 throughout , Extremities without ischemic changes , B varicosities  Laboratory See iStat  Medical Decision Making  [name] is a 71 y.o. male who presents with: bilateral lifestyle altering intermittent claudication.   The patient is scheduled for: Aortogram, bilateral leg runoff, and possible intervention I discussed with the patient the nature of angiographic procedures, especially the limited  patencies of any endovascular intervention.  The patient is aware of that the risks of an angiographic procedure include but are not limited to: bleeding, infection, access site complications, renal failure, embolization, rupture of vessel, dissection, possible need for emergent surgical intervention, possible need for surgical procedures to treat the patient's pathology, and stroke and death.    The patient is aware of the risks and agrees to proceed.  Leonides Sake, MD [facility] [place] [facility] Office: [phone] Pager: [phone]  02/02/2012, 7:33 AM

## 2012-02-02 NOTE — Op Note (Signed)
OPERATIVE NOTE   PROCEDURE: 1.  Right common femoral artery cannulation under ultrasound guidance 2.  Aortogram 3.  Bilateral leg runoff 4.  Stenting and angioplasty of right common iliac artery  (Stent: Viabahn 7 mm x 50 mm, Balloon: 7 mmx 60 mm)  PRE-OPERATIVE DIAGNOSIS: Lifestyle altering intermittent claudication  POST-OPERATIVE DIAGNOSIS: same as above   SURGEON: Leonides Sake, MD  ANESTHESIA: conscious sedation  ESTIMATED BLOOD LOSS: 50 cc  CONTRAST: 175 cc  FINDING(S):  Aorta: patent  Superior mesenteric artery: patent Celiac artery: patent  Right Left  RA Patent with nephrogram Patent with nephrogram  CIA Patent but ~50% stenosis in mid-segment, 3 penetrating ulcers in artery Patent, linear shelf like plaque <50% stenosis  EIA Patent Patent  IIA Occlude Patent but >75% stenosis  CFA Patent Patent  SFA Patent Patent but >75% mid segment stenosis  PFA Patent Patent  Pop Patent Patent  Trif Patent Patent  AT Aberrant takeoff at knee level, Patent but diseased and attenuated throughout Aberrant takeoff at knee level, Patent but diseased and attenuated throughout  Pero Patent but diseased and attenuated throughout Patent but diseased and attenuated throughout  PT Patent proximally, but occludes shortly after takeoff Patent proximally, but occludes shortly after takeoff   SPECIMEN(S):  none  INDICATIONS:   [name] is a 71 y.o. male who presents with bilateral lifestyle limiting intermittent claudication.  The patient presents for: aortogram, bilateral leg runoff, and possible intervention.  I discussed with the patient the nature of angiographic procedures, especially the limited patencies of any endovascular intervention.  The patient is aware of that the risks of an angiographic procedure include but are not limited to: bleeding, infection, access site complications, renal failure, embolization, rupture of vessel, dissection, possible need for emergent surgical  intervention, possible need for surgical procedures to treat the patient's pathology, and stroke and death.  The patient is aware of the risks and agrees to proceed.  DESCRIPTION: After full informed consent was obtained from the patient, the patient was brought back to the angiography suite.  The patient was placed supine upon the angiography table and connected to monitoring equipment.  The patient was then given conscious sedation, the amounts of which are documented in the patient's chart.  The patient was prepped and drape in the standard fashion for an angiographic procedure.  At this point, attention was turned to the right groin.  Under ultrasound guidance, the right common femoral artery was cannulated with a micropuncture needle.  The microwire was advanced into the iliac arterial system.  The needle was exchanged for a microsheath, which was loaded into the common femoral artery over the wire.  The microwire was exchanged for a [facility] wire which was advanced into the aorta.  The microsheath was then exchanged for a 5-Fr sheath which was loaded into the common femoral artery.  The Omniflush catheter was then loaded over the wire up to the level of L1.  The catheter was connected to the power injector circuit.  After de-airring and de-clotting the circuit, a power injector aortogram was completed.  The findings are as listed above.  The catheter was pulled down to just proximal to the aortic bifurcation.  LAO and RAO pelvic injections were completed.  Based on these images, placement of a covered stent was going to be necessary to exclude the atherosclerotic ulcers.  An automated bilateral leg runoff was completed to image both legs.  Based on these images, intervention on the left superficial femoral artery  and tibial arteries will likely be needed in the future but intervention on the disease right common iliac artery is the priority.  At this point, a Rosen wire was placed into the aorta.  The right  sheath was exchanged for a long 7-Fr sheath.  In total, 9500 units of Heparin were given intravenously to obtain therapeutic anticoagulation.  Hand injections demonstrated the position of the aortic bifurcation and the penetrating ulcers.  I placed a 7 mm x 50 mm Viabahn stent centered over these ulcers.  The stent was deployed and then the stent delivery system was exchanged for a 7 mm x 60 mm angioplasty balloon.  This stent was post-dilated to 8 atm.  The balloon was removed and then an Omniflush catheter replaced into the aorta.  A repeat pelvic injection demonstrated successful exclusion of the atherosclerotic ulcers.  The distal stent appeared to be partial under-deployed so I replaced the balloon in the stent and inflated it to 8 atm for 1 minute.  The completion images demonstrated resolution of the underdeployment.  At this point, the balloon and wire were removed togetheter.  The sheath was pulled down into the right external iliac artery.  The sheath was aspirated.  No clots were present and the sheath was reloaded with heparinized saline.  The sheath will be removed in the holding area once anticoagulation is reverse.  COMPLICATIONS: none  CONDITION: stable   Leonides Sake, MD [facility] [place] Office: [phone] Pager: [phone]  02/02/2012, 8:42 AM

## 2012-02-05 ENCOUNTER — Inpatient Hospital Stay (HOSPITAL_COMMUNITY)
Admission: EM | Admit: 2012-02-05 | Discharge: 2012-02-13 | DRG: 357 | Disposition: A | Discharge status: Home / Self Care | Payer: Medicare Other | Attending: General Surgery | Admitting: General Surgery

## 2012-02-05 ENCOUNTER — Other Ambulatory Visit: Payer: Self-pay

## 2012-02-05 ENCOUNTER — Encounter (HOSPITAL_COMMUNITY): Payer: Self-pay | Admitting: *Deleted

## 2012-02-05 ENCOUNTER — Emergency Department (HOSPITAL_COMMUNITY): Payer: Medicare Other

## 2012-02-05 DIAGNOSIS — K56609 Unspecified intestinal obstruction, unspecified as to partial versus complete obstruction: Secondary | ICD-10-CM

## 2012-02-05 LAB — CBC
Hemoglobin: 16.8 g/dL (ref 13.0–17.0)
MCH: 30.6 pg (ref 26.0–34.0)
MCHC: 35.8 g/dL (ref 30.0–36.0)

## 2012-02-05 LAB — URINALYSIS, MICROSCOPIC ONLY
Ketones, ur: NEGATIVE mg/dL
Protein, ur: NEGATIVE mg/dL
Urobilinogen, UA: 1 mg/dL (ref 0.0–1.0)

## 2012-02-05 LAB — BASIC METABOLIC PANEL
Calcium: 10.1 mg/dL (ref 8.4–10.5)
GFR calc non Af Amer: 52 mL/min — ABNORMAL LOW (ref >90)
Glucose, Bld: 158 mg/dL — ABNORMAL HIGH (ref 70–99)
Sodium: 132 mEq/L — ABNORMAL LOW (ref 135–145)

## 2012-02-05 LAB — POCT I-STAT TROPONIN I

## 2012-02-05 LAB — HEPATIC FUNCTION PANEL
ALT: 36 U/L (ref 0–53)
Alkaline Phosphatase: 72 U/L (ref 39–117)
Bilirubin, Direct: 1.3 mg/dL — ABNORMAL HIGH (ref 0.0–0.3)
Indirect Bilirubin: 1.8 mg/dL — ABNORMAL HIGH (ref 0.3–0.9)

## 2012-02-05 MED ORDER — LIDOCAINE VISCOUS 2 % MT SOLN
20.0000 mL | Freq: Once | OROMUCOSAL | Status: DC
  Filled 2012-02-05: qty 15

## 2012-02-05 MED ORDER — ACETAMINOPHEN 325 MG PO TABS: 650.0000 mg | ORAL_TABLET | Freq: Four times a day (QID) | ORAL | Status: DC | PRN

## 2012-02-05 MED ORDER — ACETAMINOPHEN 650 MG RE SUPP: 650.0000 mg | Freq: Four times a day (QID) | RECTAL | Status: DC | PRN

## 2012-02-05 MED ORDER — PANTOPRAZOLE SODIUM 40 MG IV SOLR
40.0000 mg | Freq: Every day | INTRAVENOUS | Status: DC
  Administered 2012-02-05 – 2012-02-10 (×6): 40 mg via INTRAVENOUS
  Filled 2012-02-05 (×7): qty 40

## 2012-02-05 MED ORDER — MORPHINE SULFATE 2 MG/ML IJ SOLN
2.0000 mg | INTRAMUSCULAR | Status: DC | PRN
  Administered 2012-02-06: 2 mg via INTRAVENOUS
  Filled 2012-02-05: qty 1

## 2012-02-05 MED ORDER — ONDANSETRON HCL 4 MG/2ML IJ SOLN: 4.0000 mg | Freq: Four times a day (QID) | INTRAMUSCULAR | Status: DC | PRN

## 2012-02-05 MED ORDER — DEXTROSE-NACL 5-0.45 % IV SOLN
INTRAVENOUS | Status: DC
  Administered 2012-02-05 – 2012-02-06 (×2): via INTRAVENOUS

## 2012-02-05 MED ORDER — ENOXAPARIN SODIUM 40 MG/0.4ML ~~LOC~~ SOLN
40.0000 mg | SUBCUTANEOUS | Status: DC
  Administered 2012-02-05 – 2012-02-12 (×8): 40 mg via SUBCUTANEOUS
  Filled 2012-02-05 (×9): qty 0.4

## 2012-02-05 NOTE — ED Notes (Signed)
Pt reports getting out of hospital yesterday follow cardiac cath and stent. Reports not feeling good since then, just having generalized fatigue,and feels "swollen" denies any cp or sob. ekg done at triage, HR 120's.

## 2012-02-05 NOTE — ED Notes (Signed)
Catheter clamped off after >1040ml of urine output in foley bag post foley insertion

## 2012-02-05 NOTE — ED Notes (Signed)
NG tube placed by MD after multiple attempts, placement confirmed by return of gastric contents and auscultation. Pt tolerated well.

## 2012-02-05 NOTE — ED Notes (Signed)
Pt in CT at this time, will place NG tube upon return to unit.

## 2012-02-05 NOTE — ED Notes (Signed)
Patient transported to X-ray 

## 2012-02-05 NOTE — ED Notes (Signed)
Foley Cath placed using sterile technique. Dark, concentrated urine returned.

## 2012-02-05 NOTE — ED Provider Notes (Signed)
History     CSN: [id number]  Arrival date & time 02/05/12  1310   First MD Initiated Contact with Patient 02/05/12 1346      Chief Complaint  Patient presents with  . Chest Pain     HPI Pt reports getting out of hospital yesterday follow  cath and stent. Reports not feeling good since then, just having generalized fatigue,and feels "swollen" denies any cp or sob. ekg done at triage, HR 120's  Past Medical History  Diagnosis Date  . Hyperlipidemia   . Hypertension   . OAD (obstructive airway disease)   . GERD (gastroesophageal reflux disease)   . Diverticulosis   . DDD (degenerative disc disease)     cervical, followed by [facility] 2012  . ED (erectile dysfunction)     no relief with cialis, etc  . Back pain     per [facility]  . Thyroid nodule   . Anemia   . Irregular heartbeat     Past Surgical History  Procedure Date  . Small intestine surgery   . Skin graft   . Tonsillectomy   . Polypectomy   . Colonoscopy     Family History  Problem Relation Age of Onset  . Heart attack Mother   . Heart disease Mother   . Hyperlipidemia Mother   . Stroke Father   . Heart attack Father   . Heart disease Father   . Hyperlipidemia Father   . Hypertension Father   . Other Father     varicose veins  . Heart attack Brother   . Prostate cancer Brother   . Cancer Brother   . Other Brother     varicose veins  . Heart attack Brother   . Heart disease Brother   . Hyperlipidemia Brother   . Other Brother     varicose veins  . Breast cancer Sister   . Diabetes Sister   . Cancer Sister   . Heart disease Brother   . Hyperlipidemia Brother   . Hypertension Brother   . Heart attack Brother     History  Substance Use Topics  . Smoking status: Former Smoker    Types: Cigarettes    Quit date: 02/28/1982  . Smokeless tobacco: Never Used   Comment: Quit in 40's   . Alcohol Use: 7.2 oz/week    12 Cans of beer per week     two 6 pack a week of beer      Review of  Systems  Allergies  Benazepril hcl and Hydrochlorothiazide  Home Medications   No current outpatient prescriptions on file.  BP 142/85  Pulse 119  Temp 98.4 F (36.9 C) (Oral)  Resp 24  SpO2 94%  Physical Exam  Nursing note and vitals reviewed. Constitutional: He is oriented to person, place, and time. He appears well-developed. No distress.  HENT:  Head: Normocephalic and atraumatic.  Eyes: Pupils are equal, round, and reactive to light.  Neck: Normal range of motion.  Cardiovascular: Normal rate and intact distal pulses.   Pulmonary/Chest: No respiratory distress. He has no wheezes. He has no rales.  Abdominal: Normal appearance. He exhibits distension. There is tenderness. There is guarding.  Musculoskeletal: Normal range of motion.  Neurological: He is alert and oriented to person, place, and time. No cranial nerve deficit.  Skin: Skin is warm and dry. No rash noted.  Psychiatric: He has a normal mood and affect. His behavior is normal.    ED Course  Procedures (including critical  care time)  Labs Reviewed  CBC - Abnormal; Notable for the following:    WBC 13.9 (*)     All other components within normal limits  BASIC METABOLIC PANEL - Abnormal; Notable for the following:    Sodium 132 (*)     Chloride 93 (*)     Glucose, Bld 158 (*)     BUN 32 (*)     GFR calc non Af Amer 52 (*)     GFR calc Af Amer 60 (*)     All other components within normal limits  HEPATIC FUNCTION PANEL - Abnormal; Notable for the following:    Total Bilirubin 3.1 (*)     Bilirubin, Direct 1.3 (*)     Indirect Bilirubin 1.8 (*)     All other components within normal limits  URINALYSIS, WITH MICROSCOPIC - Abnormal; Notable for the following:    Color, Urine AMBER (*)  BIOCHEMICALS MAY BE AFFECTED BY COLOR   APPearance CLOUDY (*)     Hgb urine dipstick LARGE (*)     Bilirubin Urine SMALL (*)     Leukocytes, UA SMALL (*)     All other components within normal limits  POCT I-STAT  TROPONIN I  LIPASE, BLOOD  BASIC METABOLIC PANEL  CBC  PROTIME-INR   Ct Abdomen Pelvis Wo Contrast  02/05/2012  *RADIOLOGY REPORT*  Clinical Data: Abdominal pain.  CT ABDOMEN AND PELVIS WITHOUT CONTRAST  Technique:  Multidetector CT imaging of the abdomen and pelvis was performed following the standard protocol without intravenous contrast.  Comparison: CT of abdomen and pelvis 03/08/2010.  Findings:  Lung Bases: Minimal dependent atelectasis in the lung bases bilaterally. There is atherosclerosis of the thoracic aorta, the great vessels of the mediastinum and the coronary arteries, including calcified atherosclerotic plaque in the left circumflex and right coronary arteries. There is a small hiatal hernia.  Abdomen/Pelvis:  There are numerous dilated loops of small bowel measuring up to approximately 5.8 cm in diameter.  The terminal ileum is extremely narrowed, suggesting a potential point of obstruction.  However, the cecum, ascending colon and transverse colon are all dilated and gas-filled, with near complete decompression of the descending colon and rectum.  No obstructing mass at the point of transition in the splenic flexure is identified within the colon.  The small bowel appears to be relatively short, suggesting prior small bowel resection. Trace amount of stranding at the transition from descending colon to sigmoid colon which is nonspecific.  Diffusely decreased attenuation throughout the hepatic parenchyma compatible with hepatic steatosis.  High attenuation material within the lumen of the gallbladder may represent biliary sludge and/or tiny gallstones.  However, the gallbladder is not distended, nor is there pericholecystic fluid or stranding to suggest cholecystitis at this time.  The unenhanced appearance of the pancreas, spleen and bilateral adrenal glands is unremarkable. There appears to be mild bilateral hydroureteronephrosis and mild bilateral perinephric stranding.  No definite  obstructing ureteral stone is noted on either side.  There are small calcifications in the upper pole of the kidneys bilaterally which may be vascular or may be nonobstructive calculi.  Urinary bladder is markedly distended, and the urine is diffusely high attenuation, which could suggest concentrated urine, hemorrhagic components, or conceivably could be related to recently administered iodinated contrast.  There is extensive atherosclerosis in the abdominal and pelvic vasculature, and there appears to be a small stent in the right common iliac artery.  Prostate gland is moderately enlarged measuring up to 6.9  by 5.6 cm.  Musculoskeletal: There are no aggressive appearing lytic or blastic lesions noted in the visualized portions of the skeleton.  IMPRESSION: 1.  Bowel gas pattern is very unusual, as detailed above.  While there does appear to be a small bowel obstruction with a transition point in the terminal ileum, this appears to be early or partial, as there is also some gaseous distension in the proximal colon. Given the markedly narrowed appearance of the terminal ileum, potential narrowing of the colon at the level of the splenic flexure, and inflammatory changes at the transition from the descending colon to sigmoid colon, clinical correlation for signs and symptoms of inflammatory bowel disease such as Crohn's enteritis is recommended. 2.  Markedly distended urinary bladder which contains high attenuation urine.  This could suggest the presence of the blood products in the urine, proteinaceous contents, highly concentrated urine and/or the presence of iodinated contrast from recent outside CT scan. 3.  While there is mild bilateral hydronephrosis and perinephric stranding, this is favored to be nonobstructive and is likely secondary to overdistention of the urinary bladder. Prostatomegaly. 4.  Hepatic steatosis. 5.  High attenuation material layering dependently within the lumen of the gallbladder may  represent biliary sludge and/or small gallstones.  No findings to suggest acute cholecystitis at this time. 6. Atherosclerosis, including at least two-vessel coronary artery disease.  Original Report Authenticated By: Florencia Reasons, M.D.   Dg Abd Acute W/chest  02/05/2012  *RADIOLOGY REPORT*  Clinical Data: Abdominal pain, shortness of breath  ACUTE ABDOMEN SERIES (ABDOMEN 2 VIEW & CHEST 1 VIEW)  Comparison: 03/15/2010  Findings: Low lung volumes.  Lungs are clear.  Heart size upper limits normal.  No effusion.  No free air.  Multiple dilated small bowel loops through the abdomen, with multiple fluid levels on the erect radiograph.  The colon is decompressed.  No pneumatosis or portal venous gas evident.  Right iliac arterial stent.  Spurring in the lower  lumbar spine.  IMPRESSION:  1.  Dilated small bowel loops with fluid levels suggesting distal small bowel obstruction.  Original Report Authenticated By: Thora Lance III, M.D.     1. SBO (small bowel obstruction)       MDM  Patient gives history of small bowel obstruction in 1961 which required surgical intervention. Surgical consult obtained for evaluation of ABO       Nelia Shi, MD 02/05/12 2135

## 2012-02-05 NOTE — H&P (Signed)
[name] is an 71 y.o. male.   Chief Complaint: Bowel obstruction HPI: Recently had stent of left iliac artery by Dr. Imogene Burn, day of discharge started having abdominal bloating with nausea and vomiting.  Not a lot of abdominal pain.  Had previous bowel obstruction in 1961 treated surgically through right abdomina paramedian incision.  Past Medical History  Diagnosis Date  . Hyperlipidemia   . Hypertension   . OAD (obstructive airway disease)   . GERD (gastroesophageal reflux disease)   . Diverticulosis   . DDD (degenerative disc disease)     cervical, followed by [facility] 2012  . ED (erectile dysfunction)     no relief with cialis, etc  . Back pain     per [facility]  . Thyroid nodule   . Anemia   . Irregular heartbeat     Past Surgical History  Procedure Date  . Small intestine surgery   . Skin graft   . Tonsillectomy   . Polypectomy   . Colonoscopy     Family History  Problem Relation Age of Onset  . Heart attack Mother   . Heart disease Mother   . Hyperlipidemia Mother   . Stroke Father   . Heart attack Father   . Heart disease Father   . Hyperlipidemia Father   . Hypertension Father   . Other Father     varicose veins  . Heart attack Brother   . Prostate cancer Brother   . Cancer Brother   . Other Brother     varicose veins  . Heart attack Brother   . Heart disease Brother   . Hyperlipidemia Brother   . Other Brother     varicose veins  . Breast cancer Sister   . Diabetes Sister   . Cancer Sister   . Heart disease Brother   . Hyperlipidemia Brother   . Hypertension Brother   . Heart attack Brother    Social History:  reports that he quit smoking about 29 years ago. His smoking use included Cigarettes. He has never used smokeless tobacco. He reports that he drinks about 7.2 ounces of alcohol per week. He reports that he does not use illicit drugs.  Allergies:  Allergies  Allergen Reactions  . Benazepril Hcl Other (See Comments)    unknown  .  Hydrochlorothiazide     REACTION: joint pain     (Not in a hospital admission)  Results for orders placed during the hospital encounter of 02/05/12 (from the past 48 hour(s))  CBC     Status: Abnormal   Collection Time   02/05/12  1:19 PM      Component Value Range Comment   WBC 13.9 (*) 4.0 - 10.5 K/uL    RBC 5.49  4.22 - 5.81 MIL/uL    Hemoglobin 16.8  13.0 - 17.0 g/dL    HCT 57.8  46.9 - 62.9 %    MCV 85.4  78.0 - 100.0 fL    MCH 30.6  26.0 - 34.0 pg    MCHC 35.8  30.0 - 36.0 g/dL    RDW 52.8  41.3 - 24.4 %    Platelets 235  150 - 400 K/uL   BASIC METABOLIC PANEL     Status: Abnormal   Collection Time   02/05/12  1:19 PM      Component Value Range Comment   Sodium 132 (*) 135 - 145 mEq/L    Potassium 4.1  3.5 - 5.1 mEq/L  Chloride 93 (*) 96 - 112 mEq/L    CO2 25  19 - 32 mEq/L    Glucose, Bld 158 (*) 70 - 99 mg/dL    BUN 32 (*) 6 - 23 mg/dL    Creatinine, Ser 1.61  0.50 - 1.35 mg/dL    Calcium 09.6  8.4 - 10.5 mg/dL    GFR calc non Af Amer 52 (*) >90 mL/min    GFR calc Af Amer 60 (*) >90 mL/min   POCT I-STAT TROPONIN I     Status: Normal   Collection Time   02/05/12  1:28 PM      Component Value Range Comment   Troponin i, poc 0.00  0.00 - 0.08 ng/mL    Comment 3            HEPATIC FUNCTION PANEL     Status: Abnormal   Collection Time   02/05/12  2:00 PM      Component Value Range Comment   Total Protein 8.2  6.0 - 8.3 g/dL    Albumin 4.1  3.5 - 5.2 g/dL    AST 29  0 - 37 U/L    ALT 36  0 - 53 U/L    Alkaline Phosphatase 72  39 - 117 U/L    Total Bilirubin 3.1 (*) 0.3 - 1.2 mg/dL    Bilirubin, Direct 1.3 (*) 0.0 - 0.3 mg/dL    Indirect Bilirubin 1.8 (*) 0.3 - 0.9 mg/dL   LIPASE, BLOOD     Status: Normal   Collection Time   02/05/12  2:00 PM      Component Value Range Comment   Lipase 14  11 - 59 U/L    Ct Abdomen Pelvis Wo Contrast  02/05/2012  *RADIOLOGY REPORT*  Clinical Data: Abdominal pain.  CT ABDOMEN AND PELVIS WITHOUT CONTRAST  Technique:  Multidetector CT  imaging of the abdomen and pelvis was performed following the standard protocol without intravenous contrast.  Comparison: CT of abdomen and pelvis 03/08/2010.  Findings:  Lung Bases: Minimal dependent atelectasis in the lung bases bilaterally. There is atherosclerosis of the thoracic aorta, the great vessels of the mediastinum and the coronary arteries, including calcified atherosclerotic plaque in the left circumflex and right coronary arteries. There is a small hiatal hernia.  Abdomen/Pelvis:  There are numerous dilated loops of small bowel measuring up to approximately 5.8 cm in diameter.  The terminal ileum is extremely narrowed, suggesting a potential point of obstruction.  However, the cecum, ascending colon and transverse colon are all dilated and gas-filled, with near complete decompression of the descending colon and rectum.  No obstructing mass at the point of transition in the splenic flexure is identified within the colon.  The small bowel appears to be relatively short, suggesting prior small bowel resection. Trace amount of stranding at the transition from descending colon to sigmoid colon which is nonspecific.  Diffusely decreased attenuation throughout the hepatic parenchyma compatible with hepatic steatosis.  High attenuation material within the lumen of the gallbladder may represent biliary sludge and/or tiny gallstones.  However, the gallbladder is not distended, nor is there pericholecystic fluid or stranding to suggest cholecystitis at this time.  The unenhanced appearance of the pancreas, spleen and bilateral adrenal glands is unremarkable. There appears to be mild bilateral hydroureteronephrosis and mild bilateral perinephric stranding.  No definite obstructing ureteral stone is noted on either side.  There are small calcifications in the upper pole of the kidneys bilaterally which may be vascular or  may be nonobstructive calculi.  Urinary bladder is markedly distended, and the urine is  diffusely high attenuation, which could suggest concentrated urine, hemorrhagic components, or conceivably could be related to recently administered iodinated contrast.  There is extensive atherosclerosis in the abdominal and pelvic vasculature, and there appears to be a small stent in the right common iliac artery.  Prostate gland is moderately enlarged measuring up to 6.9 by 5.6 cm.  Musculoskeletal: There are no aggressive appearing lytic or blastic lesions noted in the visualized portions of the skeleton.  IMPRESSION: 1.  Bowel gas pattern is very unusual, as detailed above.  While there does appear to be a small bowel obstruction with a transition point in the terminal ileum, this appears to be early or partial, as there is also some gaseous distension in the proximal colon. Given the markedly narrowed appearance of the terminal ileum, potential narrowing of the colon at the level of the splenic flexure, and inflammatory changes at the transition from the descending colon to sigmoid colon, clinical correlation for signs and symptoms of inflammatory bowel disease such as Crohn's enteritis is recommended. 2.  Markedly distended urinary bladder which contains high attenuation urine.  This could suggest the presence of the blood products in the urine, proteinaceous contents, highly concentrated urine and/or the presence of iodinated contrast from recent outside CT scan. 3.  While there is mild bilateral hydronephrosis and perinephric stranding, this is favored to be nonobstructive and is likely secondary to overdistention of the urinary bladder. Prostatomegaly. 4.  Hepatic steatosis. 5.  High attenuation material layering dependently within the lumen of the gallbladder may represent biliary sludge and/or small gallstones.  No findings to suggest acute cholecystitis at this time. 6. Atherosclerosis, including at least two-vessel coronary artery disease.  Original Report Authenticated By: Florencia Reasons, M.D.    Dg Abd Acute W/chest  02/05/2012  *RADIOLOGY REPORT*  Clinical Data: Abdominal pain, shortness of breath  ACUTE ABDOMEN SERIES (ABDOMEN 2 VIEW & CHEST 1 VIEW)  Comparison: 03/15/2010  Findings: Low lung volumes.  Lungs are clear.  Heart size upper limits normal.  No effusion.  No free air.  Multiple dilated small bowel loops through the abdomen, with multiple fluid levels on the erect radiograph.  The colon is decompressed.  No pneumatosis or portal venous gas evident.  Right iliac arterial stent.  Spurring in the lower  lumbar spine.  IMPRESSION:  1.  Dilated small bowel loops with fluid levels suggesting distal small bowel obstruction.  Original Report Authenticated By: Osa Craver, M.D.    Review of Systems  Constitutional: Negative for fever, chills and weight loss.  HENT: Negative.   Eyes: Negative.   Respiratory: Negative.   Cardiovascular: Positive for leg swelling. Negative for chest pain and palpitations.  Gastrointestinal: Positive for heartburn, nausea, vomiting and constipation. Negative for abdominal pain and diarrhea.  Genitourinary: Negative.   Musculoskeletal: Negative.   Skin: Negative.   Neurological: Negative.   Endo/Heme/Allergies: Negative.   Psychiatric/Behavioral: Negative.     Blood pressure 184/98, pulse 124, temperature 97.7 F (36.5 C), temperature source Oral, resp. rate 24, SpO2 91.00%. Physical Exam  Constitutional: He is oriented to person, place, and time. He appears well-developed and well-nourished.  HENT:  Head: Normocephalic and atraumatic.  Eyes: Conjunctivae and EOM are normal. Pupils are equal, round, and reactive to light.  Cardiovascular: Regular rhythm.  Tachycardia present.   Respiratory: Effort normal. He has wheezes.  GI: He exhibits distension. He exhibits no mass.  Bowel sounds are absent. There is no tenderness.  Genitourinary: Penis normal.  Musculoskeletal: Normal range of motion.  Neurological: He is alert and oriented to  person, place, and time. He has normal reflexes.  Skin: Skin is warm and dry.  Psychiatric: He has a normal mood and affect. His behavior is normal. Judgment and thought content normal.     Assessment/Plan Small bowel obstruction NGT placed. Admit for IV hydration and NGT decompression. If no resolutionin 24-48 hours may need surgical exploration Will admit to telemetry because of tachycardia and not being able to take his usual medications. Currently off his Plavix and ASA   Lexie Koehl O 02/05/2012, 5:31 PM

## 2012-02-05 NOTE — ED Notes (Signed)
[place], EMT, Janett Billow, RN and myself undressed pt, placed pt in gown, on monitor, continuous pulse oximetry, blood pressure cuff and oxygen [place] (2L); EKG was performed in triage, signed by Fonnie Jarvis, MD and handed to Radford Pax, MD

## 2012-02-05 NOTE — Progress Notes (Signed)
NGT was not functioning.  Call the nurse who fixed the problem.  Promptly drained 1200 cc of feculent NGT aspirate.  Placed back on LIS.  Marta Lamas. Gae Bon, MD, FACS [phone] [facility]

## 2012-02-05 NOTE — ED Notes (Addendum)
Pt states hx of stents in legs. States he just "feels uncomfortable" in his abdomen. +SOB.  Was d/c from [facility] yesterday post stent placement in groin. Denies abd aneurysm. Denies CP.

## 2012-02-06 ENCOUNTER — Telehealth: Payer: Self-pay

## 2012-02-06 ENCOUNTER — Inpatient Hospital Stay (HOSPITAL_COMMUNITY): Payer: Medicare Other

## 2012-02-06 LAB — CBC
HCT: 43.2 % (ref 39.0–52.0)
MCH: 30.3 pg (ref 26.0–34.0)
MCV: 86.1 fL (ref 78.0–100.0)
Platelets: 204 10*3/uL (ref 150–400)
RDW: 14.1 % (ref 11.5–15.5)

## 2012-02-06 LAB — BASIC METABOLIC PANEL
CO2: 27 mEq/L (ref 19–32)
Calcium: 9.3 mg/dL (ref 8.4–10.5)
Chloride: 94 mEq/L — ABNORMAL LOW (ref 96–112)
Creatinine, Ser: 1.21 mg/dL (ref 0.50–1.35)
Glucose, Bld: 150 mg/dL — ABNORMAL HIGH (ref 70–99)

## 2012-02-06 MED ORDER — KCL IN DEXTROSE-NACL 20-5-0.45 MEQ/L-%-% IV SOLN
INTRAVENOUS | Status: DC
  Administered 2012-02-06 – 2012-02-10 (×5): via INTRAVENOUS
  Administered 2012-02-11: 50 mL/h via INTRAVENOUS
  Filled 2012-02-06 (×18): qty 1000

## 2012-02-06 NOTE — Telephone Encounter (Signed)
Triage Record Num: [id number] Operator: Amy Head Patient Name: [name] Call Date & Time: 02/05/2012 11:33:52AM Patient Phone: [phone] PCP: Marga Melnick Patient Gender: Male PCP Fax : [phone] Patient DOB: [birth year] Practice Name: [facility] Reason for Call: Caller: [name]/Spouse; PCP: [name]); CB#: [phone]; Call regarding Loss of appetite, weakness; Was just discharged on 02/02/12 from [facility] status post stint placed to upper thigh/groin 02/01/12; Patient can stand but is "wobbly". Requires assistance to transfer. "Feels like everytime he eats it feels like it's stopping and having hurting in his chest." Is urinating very frequently. "He stays on the commode most of the day letting the urine dribble out." Sweating all over. Chest pain. Labored breathing. Advised 911 per guidelines. States that patient will not call 911 because of where they live, the ambulance will take him to [facility], pt states "I would rather die than go to [facility]". States that she will drive patient to [facility]. Advised 911. Protocol(s) Used: Postoperative Problems Recommended Outcome per Protocol: Activate EMS 911 Reason for Outcome: Severe breathing problems Care Advice: ~ Do not give the patient anything to eat or drink. ~ An adult should stay with the patient, preferably one trained in CPR. ~ IMMEDIATE ACTION Write down provider's name. List or place the following in a bag for transport with the patient: current prescription and/or nonprescription medications; alternative treatments, therapies and medications; and street drugs. ~ ~

## 2012-02-06 NOTE — Progress Notes (Signed)
General Surgery Note  LOS: 1 day  POD# 4 PCP - [place]  Assessment/Plan: 1.  Right common iliac angioplasty and stent - B. Chen - 02/02/2012  2.  Ileus vs SBO  WBC - 6,200 - 02/06/2012 (better)  KUB still shows dilated SB, but air in colon  No acute symptoms  To repeat labs and x-rays in AM  Wife at bedside  3.  Hypertension 4.  Obstr. Airway Disease 5.  GERD 6.  Diverticulosis 7.  On plavix and aspirin - being held 8.  T. Bili - 3.1 9.  BPH - sees Grapey  Has foley in because of urinary retention - will leave until SB dilatation improves  Subjective:  Feels better.  Passed flatus. Wife and BIL at bedside.  No abdominal tenderness. Objective:   Filed Vitals:   02/06/12 0500  BP: 128/73  Pulse: 96  Temp: 98 F (36.7 C)  Resp:      Intake/Output from previous day:  08/04 0701 - 08/05 0700 In: -  Out: 3250 [Urine:1600; Emesis/NG output:1650]  Intake/Output this shift:  Total I/O In: -  Out: 550 [Emesis/NG output:550]   Physical Exam:   General: WN older WM who is alert and oriented.    HEENT: Normal. Pupils equal. .   Lungs: Clear   Abdomen: Distended, but softer.  Bowel sounds present.  Right paramedian scar in RLQ.   Neurologic:  Grossly intact to motor and sensory function.   Psychiatric: Has normal mood and affect. Behavior is normal   Lab Results:    Basename 02/06/12 0510 02/05/12 1319  WBC 6.2 13.9*  HGB 15.2 16.8  HCT 43.2 46.9  PLT 204 235    BMET   Basename 02/06/12 0510 02/05/12 1319  NA 133* 132*  K 3.6 4.1  CL 94* 93*  CO2 27 25  GLUCOSE 150* 158*  BUN 38* 32*  CREATININE 1.21 1.33  CALCIUM 9.3 10.1    PT/INR   Basename 02/06/12 0510  LABPROT 15.2  INR 1.18    ABG  No results found for this basename: PHART:2,PCO2:2,PO2:2,HCO3:2 in the last 72 hours   Studies/Results:  Ct Abdomen Pelvis Wo Contrast  02/05/2012  *RADIOLOGY REPORT*  Clinical Data: Abdominal pain.  CT ABDOMEN AND PELVIS WITHOUT CONTRAST  Technique:   Multidetector CT imaging of the abdomen and pelvis was performed following the standard protocol without intravenous contrast.  Comparison: CT of abdomen and pelvis 03/08/2010.  Findings:  Lung Bases: Minimal dependent atelectasis in the lung bases bilaterally. There is atherosclerosis of the thoracic aorta, the great vessels of the mediastinum and the coronary arteries, including calcified atherosclerotic plaque in the left circumflex and right coronary arteries. There is a small hiatal hernia.  Abdomen/Pelvis:  There are numerous dilated loops of small bowel measuring up to approximately 5.8 cm in diameter.  The terminal ileum is extremely narrowed, suggesting a potential point of obstruction.  However, the cecum, ascending colon and transverse colon are all dilated and gas-filled, with near complete decompression of the descending colon and rectum.  No obstructing mass at the point of transition in the splenic flexure is identified within the colon.  The small bowel appears to be relatively short, suggesting prior small bowel resection. Trace amount of stranding at the transition from descending colon to sigmoid colon which is nonspecific.  Diffusely decreased attenuation throughout the hepatic parenchyma compatible with hepatic steatosis.  High attenuation material within the lumen of the gallbladder may represent biliary sludge and/or tiny  gallstones.  However, the gallbladder is not distended, nor is there pericholecystic fluid or stranding to suggest cholecystitis at this time.  The unenhanced appearance of the pancreas, spleen and bilateral adrenal glands is unremarkable. There appears to be mild bilateral hydroureteronephrosis and mild bilateral perinephric stranding.  No definite obstructing ureteral stone is noted on either side.  There are small calcifications in the upper pole of the kidneys bilaterally which may be vascular or may be nonobstructive calculi.  Urinary bladder is markedly distended, and  the urine is diffusely high attenuation, which could suggest concentrated urine, hemorrhagic components, or conceivably could be related to recently administered iodinated contrast.  There is extensive atherosclerosis in the abdominal and pelvic vasculature, and there appears to be a small stent in the right common iliac artery.  Prostate gland is moderately enlarged measuring up to 6.9 by 5.6 cm.  Musculoskeletal: There are no aggressive appearing lytic or blastic lesions noted in the visualized portions of the skeleton.  IMPRESSION: 1.  Bowel gas pattern is very unusual, as detailed above.  While there does appear to be a small bowel obstruction with a transition point in the terminal ileum, this appears to be early or partial, as there is also some gaseous distension in the proximal colon. Given the markedly narrowed appearance of the terminal ileum, potential narrowing of the colon at the level of the splenic flexure, and inflammatory changes at the transition from the descending colon to sigmoid colon, clinical correlation for signs and symptoms of inflammatory bowel disease such as Crohn's enteritis is recommended. 2.  Markedly distended urinary bladder which contains high attenuation urine.  This could suggest the presence of the blood products in the urine, proteinaceous contents, highly concentrated urine and/or the presence of iodinated contrast from recent outside CT scan. 3.  While there is mild bilateral hydronephrosis and perinephric stranding, this is favored to be nonobstructive and is likely secondary to overdistention of the urinary bladder. Prostatomegaly. 4.  Hepatic steatosis. 5.  High attenuation material layering dependently within the lumen of the gallbladder may represent biliary sludge and/or small gallstones.  No findings to suggest acute cholecystitis at this time. 6. Atherosclerosis, including at least two-vessel coronary artery disease.  Original Report Authenticated By: Florencia Reasons, M.D.   Dg Abd 1 View  02/06/2012  *RADIOLOGY REPORT*  Clinical Data:  bowel obstruction, weakness  ABDOMEN - 1 VIEW  Comparison: CT scan 02/05/2012  Findings: Persistent gaseous distended small bowel loops mid abdomen consistent with partial bowel obstruction or ileus.  Mild gaseous distention transverse colon.  NG tube in place.  IMPRESSION: Persistent gaseous distended small bowel loops mid abdomen consistent with partial bowel obstruction or ileus. NG tube in place.  Original Report Authenticated By: Natasha Mead, M.D.   Dg Abd Acute W/chest  02/05/2012  *RADIOLOGY REPORT*  Clinical Data: Abdominal pain, shortness of breath  ACUTE ABDOMEN SERIES (ABDOMEN 2 VIEW & CHEST 1 VIEW)  Comparison: 03/15/2010  Findings: Low lung volumes.  Lungs are clear.  Heart size upper limits normal.  No effusion.  No free air.  Multiple dilated small bowel loops through the abdomen, with multiple fluid levels on the erect radiograph.  The colon is decompressed.  No pneumatosis or portal venous gas evident.  Right iliac arterial stent.  Spurring in the lower  lumbar spine.  IMPRESSION:  1.  Dilated small bowel loops with fluid levels suggesting distal small bowel obstruction.  Original Report Authenticated By: Osa Craver, M.D.  Anti-infectives:   Anti-infectives    None      Ovidio Kin, MD, FACS Pager: [phone],   [facility] Surgery Office: [phone] 02/06/2012

## 2012-02-06 NOTE — Plan of Care (Signed)
Problem: Phase I Progression Outcomes Goal: Voiding-avoid urinary catheter unless indicated Outcome: Not Met (add Reason) Acute urinary retention-foley placed in ED

## 2012-02-06 NOTE — Telephone Encounter (Signed)
Noted  

## 2012-02-06 NOTE — Telephone Encounter (Signed)
I agree with advisement to go to ER Will forward to his PCP so he is aware

## 2012-02-06 NOTE — Progress Notes (Signed)
Pt passing flatus had two large loose bowel movements. NG tube disconnected temporarily for ambulation.

## 2012-02-06 NOTE — Clinical Documentation Improvement (Signed)
GENERIC DOCUMENTATION CLARIFICATION QUERY  THIS DOCUMENT IS NOT A PERMANENT PART OF THE MEDICAL RECORD  TO RESPOND TO THE THIS QUERY, FOLLOW THE INSTRUCTIONS BELOW:  1. If needed, update documentation for the patient's encounter via the notes activity.  2. Access this query again and click edit on the In [employer].  3. After updating, or not, click F2 to complete all highlighted (required) fields concerning your review. Select "additional documentation in the medical record" OR "no additional documentation provided".  4. Click Sign note button.  5. The deficiency will fall out of your In Basket *Please let us know if you are not able to complete this workflow by phone or e-mail (listed below).  Please update your documentation within the medical record to reflect your response to this query.                                                                                        02/06/12   Dear Dr. Ezzard Standing  / Associates,  In a better effort to capture your patient's severity of illness/SOIROM, risk of mortality/ reflect appropriate length of stay and utilization of resources, a review of the patient medical record has revealed the following indicators.  NOTED IN XR 8/4 "MILD BILATERAL HYDRONEPHROSIS".  IF YOU AGREE WITH THIS FINDING PLEASE DOCUMENT IN NOTES AND DC SUMMARY. THANK YOU.  Possible Clinical Conditions? - mild bilateral hydronephrosis  - Other Condition (please specify) - Cannot Clinically Determine  Supporting Information: - Risk Factors: Urinary retention requiring foley, BPH, SBO - Diagnostics: Per XR 8/4: mild bilateral hydronephrosis  - Treatment: foley cath   You may use possible, probable, or suspect with inpatient documentation. possible, probable, suspected diagnoses MUST be documented at the time of discharge  Reviewed: additional documentation in the medical record  Thank You,  Beverley Fiedler RN Clinical Documentation Specialist: Pager:  [phone] Health  Information Management: [phone] [facility]

## 2012-02-07 ENCOUNTER — Inpatient Hospital Stay (HOSPITAL_COMMUNITY): Payer: Medicare Other

## 2012-02-07 LAB — BASIC METABOLIC PANEL
BUN: 20 mg/dL (ref 6–23)
CO2: 28 mEq/L (ref 19–32)
Calcium: 8.9 mg/dL (ref 8.4–10.5)
GFR calc non Af Amer: 80 mL/min — ABNORMAL LOW (ref >90)
Glucose, Bld: 123 mg/dL — ABNORMAL HIGH (ref 70–99)
Potassium: 4.1 mEq/L (ref 3.5–5.1)
Sodium: 135 mEq/L (ref 135–145)

## 2012-02-07 NOTE — Care Management Note (Unsigned)
    Page 1 of 1   02/07/2012     4:33:21 PM   CARE MANAGEMENT NOTE 02/07/2012  Patient:  [name], [name]   Account Number:  [id number]  Date Initiated:  02/07/2012  Documentation initiated by:  GRAVES-BIGELOW,Jesslyn Viglione  Subjective/Objective Assessment:   Pt admitted with SBO.     Action/Plan:   CM will continue to f/u for disposition needs.   Anticipated DC Date:  02/10/2012   Anticipated DC Plan:  HOME W HOME HEALTH SERVICES      DC Planning Services  CM consult      Choice offered to / List presented to:             Status of service:  In process, will continue to follow Medicare Important Message given?   (If response is "NO", the following Medicare IM given date fields will be blank) Date Medicare IM given:   Date Additional Medicare IM given:    Discharge Disposition:    Per UR Regulation:  Reviewed for med. necessity/level of care/duration of stay  If discussed at Long Length of Stay Meetings, dates discussed:    Comments:

## 2012-02-07 NOTE — Progress Notes (Signed)
UR Completed Eaden Hettinger Graves-Bigelow, RN,BSN [phone]  

## 2012-02-07 NOTE — Progress Notes (Signed)
Subjective: Resting quietly in  Bed, has had abd x-ray this am.  Now with coarse rhonchi and productive cough. NG clamped. Reports + BM this morning  Objective: Vital signs in last 24 hours: Temp:  [98.3 F (36.8 C)-98.4 F (36.9 C)] 98.4 F (36.9 C) (08/06 0549) Pulse Rate:  [102-111] 111  (08/06 0549) Resp:  [18] 18  (08/06 0549) BP: (146-148)/(74-85) 146/74 mmHg (08/06 0549) SpO2:  [92 %-93 %] 93 % (08/06 0549) Last BM Date: 02/06/12  Intake/Output from previous day: 08/05 0701 - 08/06 0700 In: -  Out: 2250 [Urine:1600; Emesis/NG output:650] Intake/Output this shift:    General appearance: alert, cooperative, no distress and slowed mentation Chest: coarse rhonchi bilaterally productive of what appears to be bilious appearing sputum; similar to NG tube cannister contents. ? Aspiration of stomach contents. Abdomen: remains distended, no BS, report of 1 BM this morning, NG does not appear to be in position at this time.  Lab Results:   Basename 02/06/12 0510 02/05/12 1319  WBC 6.2 13.9*  HGB 15.2 16.8  HCT 43.2 46.9  PLT 204 235   BMET  Basename 02/07/12 0502 02/06/12 0510  NA 135 133*  K 4.1 3.6  CL 99 94*  CO2 28 27  GLUCOSE 123* 150*  BUN 20 38*  CREATININE 0.99 1.21  CALCIUM 8.9 9.3   PT/INR  Basename 02/06/12 0510  LABPROT 15.2  INR 1.18   ABG No results found for this basename: PHART:2,PCO2:2,PO2:2,HCO3:2 in the last 72 hours  Studies/Results: Ct Abdomen Pelvis Wo Contrast  02/05/2012  *RADIOLOGY REPORT*  Clinical Data: Abdominal pain.  CT ABDOMEN AND PELVIS WITHOUT CONTRAST  Technique:  Multidetector CT imaging of the abdomen and pelvis was performed following the standard protocol without intravenous contrast.  Comparison: CT of abdomen and pelvis 03/08/2010.  Findings:  Lung Bases: Minimal dependent atelectasis in the lung bases bilaterally. There is atherosclerosis of the thoracic aorta, the great vessels of the mediastinum and the coronary  arteries, including calcified atherosclerotic plaque in the left circumflex and right coronary arteries. There is a small hiatal hernia.  Abdomen/Pelvis:  There are numerous dilated loops of small bowel measuring up to approximately 5.8 cm in diameter.  The terminal ileum is extremely narrowed, suggesting a potential point of obstruction.  However, the cecum, ascending colon and transverse colon are all dilated and gas-filled, with near complete decompression of the descending colon and rectum.  No obstructing mass at the point of transition in the splenic flexure is identified within the colon.  The small bowel appears to be relatively short, suggesting prior small bowel resection. Trace amount of stranding at the transition from descending colon to sigmoid colon which is nonspecific.  Diffusely decreased attenuation throughout the hepatic parenchyma compatible with hepatic steatosis.  High attenuation material within the lumen of the gallbladder may represent biliary sludge and/or tiny gallstones.  However, the gallbladder is not distended, nor is there pericholecystic fluid or stranding to suggest cholecystitis at this time.  The unenhanced appearance of the pancreas, spleen and bilateral adrenal glands is unremarkable. There appears to be mild bilateral hydroureteronephrosis and mild bilateral perinephric stranding.  No definite obstructing ureteral stone is noted on either side.  There are small calcifications in the upper pole of the kidneys bilaterally which may be vascular or may be nonobstructive calculi.  Urinary bladder is markedly distended, and the urine is diffusely high attenuation, which could suggest concentrated urine, hemorrhagic components, or conceivably could be related to recently administered iodinated  contrast.  There is extensive atherosclerosis in the abdominal and pelvic vasculature, and there appears to be a small stent in the right common iliac artery.  Prostate gland is moderately  enlarged measuring up to 6.9 by 5.6 cm.  Musculoskeletal: There are no aggressive appearing lytic or blastic lesions noted in the visualized portions of the skeleton.  IMPRESSION: 1.  Bowel gas pattern is very unusual, as detailed above.  While there does appear to be a small bowel obstruction with a transition point in the terminal ileum, this appears to be early or partial, as there is also some gaseous distension in the proximal colon. Given the markedly narrowed appearance of the terminal ileum, potential narrowing of the colon at the level of the splenic flexure, and inflammatory changes at the transition from the descending colon to sigmoid colon, clinical correlation for signs and symptoms of inflammatory bowel disease such as Crohn's enteritis is recommended. 2.  Markedly distended urinary bladder which contains high attenuation urine.  This could suggest the presence of the blood products in the urine, proteinaceous contents, highly concentrated urine and/or the presence of iodinated contrast from recent outside CT scan. 3.  While there is mild bilateral hydronephrosis and perinephric stranding, this is favored to be nonobstructive and is likely secondary to overdistention of the urinary bladder. Prostatomegaly. 4.  Hepatic steatosis. 5.  High attenuation material layering dependently within the lumen of the gallbladder may represent biliary sludge and/or small gallstones.  No findings to suggest acute cholecystitis at this time. 6. Atherosclerosis, including at least two-vessel coronary artery disease.  Original Report Authenticated By: Florencia Reasons, M.D.   Dg Abd 1 View  02/06/2012  *RADIOLOGY REPORT*  Clinical Data:  bowel obstruction, weakness  ABDOMEN - 1 VIEW  Comparison: CT scan 02/05/2012  Findings: Persistent gaseous distended small bowel loops mid abdomen consistent with partial bowel obstruction or ileus.  Mild gaseous distention transverse colon.  NG tube in place.  IMPRESSION:  Persistent gaseous distended small bowel loops mid abdomen consistent with partial bowel obstruction or ileus. NG tube in place.  Original Report Authenticated By: Natasha Mead, M.D.   Dg Abd 2 Views  02/07/2012  *RADIOLOGY REPORT*  Clinical Data: Vomiting, nausea, small bowel obstruction  ABDOMEN - 2 VIEW  Comparison: Plain film 02/06/2012  Findings: NG tube is in the stomach.  Dilated loops of small bowel have decreased slightly in diameter compared to prior measuring 4.5 cm compared to 5.3 cm.  There is gas throughout the colon and rectum.  Left lateral decubitus view demonstrates no intraperitoneal free air.  IMPRESSION: Slight improvement in small bowel obstruction with NG tube in place.  Original Report Authenticated By: Genevive Bi, M.D.   Dg Abd Acute W/chest  02/05/2012  *RADIOLOGY REPORT*  Clinical Data: Abdominal pain, shortness of breath  ACUTE ABDOMEN SERIES (ABDOMEN 2 VIEW & CHEST 1 VIEW)  Comparison: 03/15/2010  Findings: Low lung volumes.  Lungs are clear.  Heart size upper limits normal.  No effusion.  No free air.  Multiple dilated small bowel loops through the abdomen, with multiple fluid levels on the erect radiograph.  The colon is decompressed.  No pneumatosis or portal venous gas evident.  Right iliac arterial stent.  Spurring in the lower  lumbar spine.  IMPRESSION:  1.  Dilated small bowel loops with fluid levels suggesting distal small bowel obstruction.  Original Report Authenticated By: Osa Craver, M.D.    Anti-infectives: Anti-infectives    None  Assessment/Plan:  1. Abdominal xray report from today: NG tube is in the stomach. Dilated loops of small bowel  have decreased slightly in diameter compared to prior measuring 4.5  cm compared to 5.3 cm. There is gas throughout the colon and  rectum. Left lateral decubitus view demonstrates no  intraperitoneal free air. Slight improvement in small bowel obstruction with NG tube in  place.  1a. ? aspiration  secondary to displacement of NG tube: Will get chest X-ray now.  1b. Will ask IR to replace NG tube: return to intermittent sxn for now, will discuss further coarse with Dr. Ezzard Standing.  2. Right common iliac angioplasty and stent - B. Chen - 02/02/2012  3. Hypertension  4. Obstr. Airway Disease  5. GERD  6. Diverticulosis  7. On plavix and aspirin - being held  8. T. Bili - 3.1  9. BPH - sees Grapey (mild bilateral hydronephrosis noted on initial CT scan, in addition to a very large bladder)  Has foley in because of urinary retention - will leave until SB dilatation improves    LOS: 2 days    DUANNE, JEREMY 02/07/2012  CXR okay.  According to nurse, his breathing is better than earlier.  Will order IS. KUB shows a lot of colon air.  With improvement in bowel function and underlying lung disease, will leave NGT out and keep NPO.  So I will cancel IR placement of NGT. Wife in room. Needs to ambulate more than he is.  Ovidio Kin, MD, [facility] Surgery Pager: [phone] Office phone:  [phone]

## 2012-02-08 ENCOUNTER — Inpatient Hospital Stay (HOSPITAL_COMMUNITY): Payer: Medicare Other

## 2012-02-08 LAB — BASIC METABOLIC PANEL
BUN: 11 mg/dL (ref 6–23)
CO2: 26 mEq/L (ref 19–32)
Calcium: 8.9 mg/dL (ref 8.4–10.5)
Chloride: 102 mEq/L (ref 96–112)
Creatinine, Ser: 0.88 mg/dL (ref 0.50–1.35)
Glucose, Bld: 114 mg/dL — ABNORMAL HIGH (ref 70–99)

## 2012-02-08 MED ORDER — LEVALBUTEROL HCL 0.63 MG/3ML IN NEBU
0.6300 mg | INHALATION_SOLUTION | Freq: Four times a day (QID) | RESPIRATORY_TRACT | Status: DC
  Administered 2012-02-08 – 2012-02-09 (×5): 0.63 mg via RESPIRATORY_TRACT
  Filled 2012-02-08 (×8): qty 3

## 2012-02-08 NOTE — Progress Notes (Signed)
Patient ID: [name], male   DOB: [birth year], 71 y.o.   MRN: [id number]    Subjective: Resting quietly in  Bed, has had abd x-ray this am. Still with occasional coarse breath sounds and productive cough. Using IS. foley remains in place. Objective: Vital signs in last 24 hours: Temp:  [97.4 F (36.3 C)-97.7 F (36.5 C)] 97.7 F (36.5 C) (08/07 0500) Pulse Rate:  [88-107] 88  (08/07 0500) Resp:  [18-20] 20  (08/07 0500) BP: (125-153)/(82-84) 151/82 mmHg (08/07 0500) SpO2:  [93 %-95 %] 94 % (08/07 0500) Last BM Date: 02/07/12  Intake/Output from previous day: 08/06 0701 - 08/07 0700 In: 2375 [I.V.:2375] Out: 500 [Urine:500] Intake/Output this shift:    General appearance: alert, cooperative, no distress and slowed mentation Chest: coarse rhonchi bilaterally productive of clear appearing sputum. Chest Xray report: Findings: There is increased dilatation of multiple proximal small  bowel loops. There is diminished air in the nondistended colon.  No free air on the decubitus view. NG tube has been removed.  IMPRESSION:  Persistent small bowel obstruction. The small bowel distention  appears slightly more prominent than on the prior exam. Abdomen: remains distended, soft,  no BS, report of 1 BM this morning, small amount of flatus passed per patient. VSS, afebrile, WBC wnl, labs wnl.  Lab Results:   Basename 02/06/12 0510 02/05/12 1319  WBC 6.2 13.9*  HGB 15.2 16.8  HCT 43.2 46.9  PLT 204 235   BMET  Basename 02/08/12 0458 02/07/12 0502  NA 138 135  K 4.0 4.1  CL 102 99  CO2 26 28  GLUCOSE 114* 123*  BUN 11 20  CREATININE 0.88 0.99  CALCIUM 8.9 8.9   PT/INR  Basename 02/06/12 0510  LABPROT 15.2  INR 1.18   ABG No results found for this basename: PHART:2,PCO2:2,PO2:2,HCO3:2 in the last 72 hours  Studies/Results: Dg Chest 1 View  02/07/2012  *RADIOLOGY REPORT*  Clinical Data: Cough.  Possible aspiration.  CHEST - 1 VIEW  Comparison: 04/03/2008  Findings:  Heart and mediastinal contours are within normal limits. No focal opacities or effusions.  No acute bony abnormality.  IMPRESSION: No active cardiopulmonary disease.  Original Report Authenticated By: Cyndie Chime, M.D.   Dg Abd 2 Views  02/08/2012  *RADIOLOGY REPORT*  Clinical Data: Small bowel obstruction.  Diarrhea.  ABDOMEN - 2 VIEW  Comparison: 08/04, 08/05, and 02/07/2012 and CT scan dated 02/05/2012  Findings: There is increased dilatation of multiple proximal small bowel loops.  There is diminished air in the nondistended colon. No free air on the decubitus view.  NG tube has been removed.  IMPRESSION: Persistent small bowel obstruction. The small bowel distention appears slightly more prominent than on the prior exam.  Original Report Authenticated By: Gwynn Burly, M.D.   Dg Abd 2 Views  02/07/2012  *RADIOLOGY REPORT*  Clinical Data: Vomiting, nausea, small bowel obstruction  ABDOMEN - 2 VIEW  Comparison: Plain film 02/06/2012  Findings: NG tube is in the stomach.  Dilated loops of small bowel have decreased slightly in diameter compared to prior measuring 4.5 cm compared to 5.3 cm.  There is gas throughout the colon and rectum.  Left lateral decubitus view demonstrates no intraperitoneal free air.  IMPRESSION: Slight improvement in small bowel obstruction with NG tube in place.  Original Report Authenticated By: Genevive Bi, M.D.    Anti-infectives: Anti-infectives    None      Assessment/Plan: 1. Ileus vs SBO - ? A little  worse today.  WBC - 6,200 - 02/06/2012 (better)   KUB shows dilated SB, but less colon air today.  The patient is hungry and ready to eat.   Continue NPO and to repeat x-rays in AM.  He does not have an NGT.   Wife at bedside   1. Right common iliac angioplasty and stent - B. Chen - 02/02/2012  2. Hypertension  3. Obstr. Airway Disease  4. GERD  5. Diverticulosis  6. On plavix and aspirin - being held  7. T. Bili - 3.1 on 02/05/12 8. BPH - sees Grapey  (mild bilateral hydronephrosis noted on initial CT scan, in addition to a very large bladder)  Plan: 1. Ambulate at least tid with assistance in hallways 2. IS 3. Continue NPO for now 4. Repeat abd films in am 5. Continue IVF while NPO 6. Will try trial of foley out (may need it reinserted if unable to void after 4 hrs) 7. Nebulizer for pulmonary toilet.    LOS: 3 days    DUANNE, JEREMY 02/08/2012  Agree with above. It looks like the patient is trying to get better, his films just look a little worse.  Ovidio Kin, MD, [facility] Surgery Pager: [phone] Office phone:  [phone]

## 2012-02-08 NOTE — Progress Notes (Signed)
Pt ambulated ~140 feet with front-wheel walker.  Pt tolerated well with no complaints.

## 2012-02-08 NOTE — Progress Notes (Signed)
CSW received referral for potential disposition needs. Pt plans to dc home at discharge. Physical therapy recommended no pt follow up.   .No further Clinical Social Work needs, signing off.   Catha Gosselin, Theresia Majors  [phone] .02/08/2012 1323pm

## 2012-02-08 NOTE — Progress Notes (Signed)
Paged Dr. Lindie Spruce in relation to pt not having voided since 0930 AM after foley catheter was discontinued. New order received to staight cath, if greater than 300 cc out to place foley catheter back in. Thanks Ancil Linsey RN

## 2012-02-08 NOTE — Evaluation (Signed)
Physical Therapy Evaluation Patient Details Name: [name] MRN: [id number] DOB: 01/31/1941 Today's Date: 02/08/2012 Time: 1202-1219 PT Time Calculation (min): 17 min  PT Assessment / Plan / Recommendation Clinical Impression  pt presents with R Common Iliac Angio and Stent with r/o SBO vs Ileus.  pt moving well and anticipate good progress.  Pending LOS pt may not need HHPT at D/C.  Will refer pt to Mobility team.      PT Assessment  Patient needs continued PT services    Follow Up Recommendations  No PT follow up    Barriers to Discharge None      Equipment Recommendations  Rolling walker with 5" wheels    Recommendations for Other Services     Frequency Min 3X/week    Precautions / Restrictions Precautions Precautions: Fall Restrictions Weight Bearing Restrictions: No   Pertinent Vitals/Pain Denies pain.        Mobility  Bed Mobility Bed Mobility: Supine to Sit;Sitting - Scoot to Edge of Bed Supine to Sit: 5: Supervision;With rails Sitting - Scoot to Edge of Bed: 6: Modified independent (Device/Increase time) Transfers Transfers: Sit to Stand;Stand to Sit Sit to Stand: 5: Supervision;With upper extremity assist;From bed Stand to Sit: 5: Supervision;With upper extremity assist;To bed Details for Transfer Assistance: cues for UE use.   Ambulation/Gait Ambulation/Gait Assistance: 5: Supervision Ambulation Distance (Feet): 200 Feet Assistive device: Rolling walker Ambulation/Gait Assistance Details: cues for positioning in RW, upright posture.   Gait Pattern: Step-through pattern;Decreased stride length;Trunk flexed Stairs: No Wheelchair Mobility Wheelchair Mobility: No    Exercises     PT Diagnosis: Difficulty walking  PT Problem List: Decreased activity tolerance;Decreased balance;Decreased mobility;Decreased knowledge of use of DME PT Treatment Interventions: DME instruction;Gait training;Stair training;Functional mobility training;Therapeutic  activities;Therapeutic exercise;Balance training;Patient/family education   PT Goals Acute Rehab PT Goals PT Goal Formulation: With patient Time For Goal Achievement: 02/22/12 Potential to Achieve Goals: Good Pt will go Supine/Side to Sit: Independently PT Goal: Supine/Side to Sit - Progress: Goal set today Pt will go Sit to Supine/Side: Independently PT Goal: Sit to Supine/Side - Progress: Goal set today Pt will go Sit to Stand: Independently PT Goal: Sit to Stand - Progress: Goal set today Pt will go Stand to Sit: Independently PT Goal: Stand to Sit - Progress: Goal set today Pt will Ambulate: >150 feet;Independently PT Goal: Ambulate - Progress: Goal set today Pt will Go Up / Down Stairs: 3-5 stairs;with modified independence;with rail(s) PT Goal: Up/Down Stairs - Progress: Goal set today  Visit Information  Last PT Received On: 02/08/12 Assistance Needed: +1    Subjective Data  Subjective: I'd love to walk.   Patient Stated Goal: Walking with wife   Prior Functioning  Home Living Lives With: Spouse Available Help at Discharge: Family;Available 24 hours/day Type of Home: House Home Access: Stairs to enter [employer] of Steps: 5 Entrance Stairs-Rails: Right Home Layout: One level Bathroom Shower/Tub: Engineer, manufacturing systems: Standard Bathroom Accessibility: No Home Adaptive Equipment:  (Might have RW, wife to check.  ) Prior Function Level of Independence: Independent Able to Take Stairs?: Yes Driving: Yes Vocation: Retired Musician: No difficulties    Cognition  Overall Cognitive Status: Appears within functional limits for tasks assessed/performed Arousal/Alertness: Awake/alert Orientation Level: Oriented X4 / Intact Behavior During Session: [facility] for tasks performed    Extremity/Trunk Assessment Right Lower Extremity Assessment RLE ROM/Strength/Tone: WFL for tasks assessed RLE Sensation: WFL - Light Touch Left Lower  Extremity Assessment LLE ROM/Strength/Tone: [facility]  for tasks assessed LLE Sensation: [facility] - Light Touch Trunk Assessment Trunk Assessment: Normal   Balance Balance Balance Assessed: No  End of Session PT - End of Session Equipment Utilized During Treatment: Gait belt Activity Tolerance: Patient tolerated treatment well Patient left: in bed;with call bell/phone within reach;with family/visitor present (Sitting EOB) Nurse Communication: Mobility status  GP     Sunny Schlein, PT [phone] 02/08/2012, 1:01 PM

## 2012-02-08 NOTE — Progress Notes (Signed)
Pt has ambulated 200 feet without walker . Pt was asymptomatic, walked very well. Will continue to walk later today. Foley catheter out  As per MD instruction. Ancil Linsey RN

## 2012-02-09 ENCOUNTER — Inpatient Hospital Stay (HOSPITAL_COMMUNITY): Payer: Medicare Other

## 2012-02-09 MED ORDER — LEVALBUTEROL HCL 0.63 MG/3ML IN NEBU
0.6300 mg | INHALATION_SOLUTION | Freq: Four times a day (QID) | RESPIRATORY_TRACT | Status: DC | PRN
  Filled 2012-02-09: qty 3

## 2012-02-09 NOTE — Progress Notes (Signed)
Patient ID: [name], male   DOB: 1941-04-14, 71 y.o.   MRN: [id number]    Subjective: Pt feels well.  No complaints.  Foley replaced over night.  Still passing flatus.  Very small BM  Objective: Vital signs in last 24 hours: Temp:  [97.4 F (36.3 C)-98.7 F (37.1 C)] 97.4 F (36.3 C) 02/18/2023 0500) Pulse Rate:  [85-110] 103  February 18, 2023 0500) Resp:  [18-20] 20  02-18-2023 0500) BP: (138-155)/(73-89) 155/89 mmHg 18-Feb-2023 0500) SpO2:  [94 %-97 %] 95 % 18-Feb-2023 0500) Last BM Date: 18-Feb-2012  Intake/Output from previous day: 08/07 0701 - 02-18-2023 0700 In: -  Out: 1400 [Urine:1400] Intake/Output this shift:    PE: Abd: soft, NT, minimal bloating, +BS Ht: reg Lungs: CTAB  Lab Results:  No results found for this basename: WBC:2,HGB:2,HCT:2,PLT:2 in the last 72 hours BMET  Basename 02/08/12 0458 02/07/12 0502  NA 138 135  K 4.0 4.1  CL 102 99  CO2 26 28  GLUCOSE 114* 123*  BUN 11 20  CREATININE 0.88 0.99  CALCIUM 8.9 8.9   PT/INR No results found for this basename: LABPROT:2,INR:2 in the last 72 hours CMP     Component Value Date/Time   NA 138 02/08/2012 0458   K 4.0 02/08/2012 0458   CL 102 02/08/2012 0458   CO2 26 02/08/2012 0458   GLUCOSE 114* 02/08/2012 0458   BUN 11 02/08/2012 0458   CREATININE 0.88 02/08/2012 0458   CALCIUM 8.9 02/08/2012 0458   PROT 8.2 02/05/2012 1400   ALBUMIN 4.1 02/05/2012 1400   AST 29 02/05/2012 1400   ALT 36 02/05/2012 1400   ALKPHOS 72 02/05/2012 1400   BILITOT 3.1* 02/05/2012 1400   GFRNONAA 84* 02/08/2012 0458   GFRAA >90 02/08/2012 0458   Lipase     Component Value Date/Time   LIPASE 14 02/05/2012 1400       Studies/Results: Dg Abd 2 Views  02/18/12  *RADIOLOGY REPORT*  Clinical Data: Abdominal pain and distention  ABDOMEN - 2 VIEW  Comparison: 02/08/2012  Findings: Dilated mid abdominal small bowel loops are slightly increased in caliber, maximally 6.1 cm.  No cough pain features such as pneumatosis, free air, or portal venous gas.  Colon is decompressed.   Lung bases are clear in their visualized aspects. No acute osseous abnormality.  IMPRESSION: Increase in caliber of dilated mid abdominal loops most compatible with a small bowel obstruction.  Original Report Authenticated By: Harrel Lemon, M.D.   Dg Abd 2 Views  02/08/2012  *RADIOLOGY REPORT*  Clinical Data: Small bowel obstruction.  Diarrhea.  ABDOMEN - 2 VIEW  Comparison: 08/04, 08/05, and 02/07/2012 and CT scan dated 02/05/2012  Findings: There is increased dilatation of multiple proximal small bowel loops.  There is diminished air in the nondistended colon. No free air on the decubitus view.  NG tube has been removed.  IMPRESSION: Persistent small bowel obstruction. The small bowel distention appears slightly more prominent than on the prior exam.  Original Report Authenticated By: Gwynn Burly, M.D.    Anti-infectives: Anti-infectives    None       Assessment/Plan  1. PSBO  Wife at bedside  2. Urinary retention  BPH - sees Grapey   We tried the foley out, but that did not work.  So he has a foley back in.  3. Hypertension  4. Obstr. Airway Disease  5. GERD  6. Diverticulosis  7. On plavix and aspirin - being held  8. T. Bili -  3.1  9.  Right common iliac angioplasty and stent - B. Chen - 02/02/2012  Plan: 1. X-rays today do not appear much different than yesterday, but the patient is without N/V and passing flatus.  We will give clear liquids today.  If he does not tolerate then he will likely need surgical intervention this admission 2. May give Dr. Isabel Caprice a call to see the patient regarding his urinary retention.   LOS: 4 days   OSBORNE,KELLY E 02/09/2012  Agree with above. He looks good, is passing flatus, has no abdominal pain, and his abdomen is soft.  I think it is worth feeding him and see how he does.  Ovidio Kin, MD, [facility] Surgery Pager: [phone] Office phone:  [phone]

## 2012-02-10 MED ORDER — TAMSULOSIN HCL 0.4 MG PO CAPS
0.4000 mg | ORAL_CAPSULE | Freq: Every day | ORAL | Status: DC
  Administered 2012-02-10 – 2012-02-12 (×3): 0.4 mg via ORAL
  Filled 2012-02-10 (×4): qty 1

## 2012-02-10 NOTE — Progress Notes (Signed)
Physical Therapy Treatment and Discharge Patient Details Name: [name] MRN: [id number] DOB: April 09, 1941 Today's Date: 02/10/2012 Time: 7829-5621 PT Time Calculation (min): 25 min  PT Assessment / Plan / Recommendation Comments on Treatment Session  Pt up with wife's assist on arrival (to/from bathroom), not using RW, but holding onto IV. Only concern re: mobility was still needing help with in/out of bed. Denied need to do stair training. All questions/needs addressed and pt/wife agreeable with d/c from PT. Mobility team to continue to follow. Pt instructed to amb in halls at least 3x/day, if not more.    Follow Up Recommendations  No PT follow up    Barriers to Discharge        Equipment Recommendations  None recommended by PT    Recommendations for Other Services    Frequency     Plan All goals met and education completed, patient dischaged from PT services    Precautions / Restrictions Precautions Precautions: None   Pertinent Vitals/Pain Denied pain x 3    Mobility  Bed Mobility Bed Mobility: Right Sidelying to Sit;Sitting - Scoot to [place] of Bed;Sit to Sidelying Right;Rolling Right;Rolling Left Rolling Right: 7: Independent Rolling Left: 7: Independent Right Sidelying to Sit: 5: Supervision;6: Modified independent (Device/Increase time);HOB flat Sitting - Scoot to Edge of Bed: 7: Independent Sit to Sidelying Right: 6: Modified independent (Device/Increase time);HOB flat Details for Bed Mobility Assistance: Pt repeated in/out of bed x 2; required cues for technique on first attempt and then demonstrated modified I 2nd attempt Transfers Transfers: Sit to Stand;Stand to Sit Sit to Stand: 7: Independent;Without upper extremity assist;From toilet;From bed Stand to Sit: 7: Independent;With upper extremity assist;To bed;To chair/3-in-1;To toilet Ambulation/Gait Ambulation/Gait Assistance: 6: Modified independent (Device/Increase time);5: Supervision Ambulation Distance  (Feet): 120 Feet Assistive device: None Ambulation/Gait Assistance Details: initially pt pushing IV pole himself, supervision for lines; progressed to no device without difficulty, no limping, only decr velocity Gait Pattern: Step-through pattern;Decreased stride length;Trunk flexed    Exercises     PT Diagnosis:    PT Problem List:   PT Treatment Interventions:     PT Goals Acute Rehab PT Goals Pt will go Supine/Side to Sit: Independently PT Goal: Supine/Side to Sit - Progress: Met Pt will go Sit to Supine/Side: Independently PT Goal: Sit to Supine/Side - Progress: Met Pt will go Sit to Stand: Independently PT Goal: Sit to Stand - Progress: Met Pt will go Stand to Sit: Independently PT Goal: Stand to Sit - Progress: Met Pt will Ambulate: >150 feet;Independently PT Goal: Ambulate - Progress: Partly met (distance not demonstrated, yet pt could have) Pt will Go Up / Down Stairs: 3-5 stairs;with modified independence;with rail(s) PT Goal: Up/Down Stairs - Progress: Discontinued (comment) (pt deferred)  Visit Information  Last PT Received On: 02/10/12 Assistance Needed: +1    Subjective Data  Subjective: Pt reports walking is no problem and steps into home will not be a problem. into/out of bed has been the only difficulty Patient Stated Goal: Walking with wife   Cognition  Overall Cognitive Status: Appears within functional limits for tasks assessed/performed    Balance     End of Session PT - End of Session Activity Tolerance: Patient tolerated treatment well Patient left: in chair;with call bell/phone within reach;with family/visitor present    PT Discharge Note  Patient is being discharged from PT services secondary to:    Goals met and no further therapy needs identified.  Please see latest Therapy Progress Note for  current level of functioning and progress toward goals.  Progress and discharge plan and discussed with patient/caregiver and they    Agree      Ahmad Vanwey 02/10/2012, 9:29 AM Pager [phone]

## 2012-02-10 NOTE — Progress Notes (Signed)
Utilization review completed.  

## 2012-02-10 NOTE — Progress Notes (Signed)
Patient ID: [name], male   DOB: 01/25/1941, 71 y.o.   MRN: [id number]    Subjective: Pt feels well.  No complaints.  Tolerating clear liquids well without N/V.  Still with lots of flatus, but no significant BM.  Ambulating QID in halls  Objective: Vital signs in last 24 hours: Temp:  [98 F (36.7 C)-98.6 F (37 C)] 98.6 F (37 C) (08/09 0500) Pulse Rate:  [76-88] 76  (08/09 0500) Resp:  [18] 18  (08/09 0500) BP: (151-165)/(77-90) 165/90 mmHg (08/09 0500) SpO2:  [94 %-95 %] 95 % (08/09 0500) Last BM Date: 2012/02/10  Intake/Output from previous day: 2023/02/10 0701 - 08/09 0700 In: 1635 [P.O.:760; I.V.:875] Out: 1151 [Urine:1150; Stool:1] Intake/Output this shift:    PE: Abd: soft, but increased distention, +BS, NT Heart: regular Lungs: CTAB  Lab Results:  No results found for this basename: WBC:2,HGB:2,HCT:2,PLT:2 in the last 72 hours BMET  [facility] 02/08/12 0458  NA 138  K 4.0  CL 102  CO2 26  GLUCOSE 114*  BUN 11  CREATININE 0.88  CALCIUM 8.9   PT/INR No results found for this basename: LABPROT:2,INR:2 in the last 72 hours CMP     Component Value Date/Time   NA 138 02/08/2012 0458   K 4.0 02/08/2012 0458   CL 102 02/08/2012 0458   CO2 26 02/08/2012 0458   GLUCOSE 114* 02/08/2012 0458   BUN 11 02/08/2012 0458   CREATININE 0.88 02/08/2012 0458   CALCIUM 8.9 02/08/2012 0458   PROT 8.2 02/05/2012 1400   ALBUMIN 4.1 02/05/2012 1400   AST 29 02/05/2012 1400   ALT 36 02/05/2012 1400   ALKPHOS 72 02/05/2012 1400   BILITOT 3.1* 02/05/2012 1400   GFRNONAA 84* 02/08/2012 0458   GFRAA >90 02/08/2012 0458   Lipase     Component Value Date/Time   LIPASE 14 02/05/2012 1400    Studies/Results: Dg Abd 2 Views  10-Feb-2012  *RADIOLOGY REPORT*  Clinical Data: Abdominal pain and distention  ABDOMEN - 2 VIEW  Comparison: 02/08/2012  Findings: Dilated mid abdominal small bowel loops are slightly increased in caliber, maximally 6.1 cm.  No cough pain features such as pneumatosis, free air, or portal  venous gas.  Colon is decompressed.  Lung bases are clear in their visualized aspects. No acute osseous abnormality.  IMPRESSION: Increase in caliber of dilated mid abdominal loops most compatible with a small bowel obstruction.  Original Report Authenticated By: Harrel Lemon, M.D.    Anti-infectives: Anti-infectives    None     Assessment/Plan 1. PSBO  On trial of liquids.  2. Urinary retention, foley still in place with good UOP 3. Hypertension  4. Obstr. Airway Disease  5. GERD  6. Diverticulosis  7. On plavix and aspirin - being held  8. T. Bili - 3.1  9. Right common iliac angioplasty and stent - B. Chen - 02/02/2012  Plan: 1. Due to increase distention, we will leave the patient on clear liquids for now and follow him clinically.  If he regresses or fails management, he will likely need surgical intervention. 2. Will try to contact Dr. Isabel Caprice again today and make him aware of patient's urinary retention.    LOS: 5 days    OSBORNE,KELLY E 02/10/2012  Agree with above.  Giving him a trial of PO's.   Ovidio Kin, MD, [facility] Surgery Pager: [phone] Office phone:  [phone]

## 2012-02-11 MED ORDER — PANTOPRAZOLE SODIUM 40 MG PO TBEC
40.0000 mg | DELAYED_RELEASE_TABLET | Freq: Every day | ORAL | Status: DC
  Administered 2012-02-11 – 2012-02-12 (×2): 40 mg via ORAL
  Filled 2012-02-11 (×2): qty 1

## 2012-02-11 NOTE — Progress Notes (Signed)
  Subjective: Passing flatus and had a bm this am, tol clears without n/v  Objective: Vital signs in last 24 hours: Temp:  [97.8 F (36.6 C)-98.6 F (37 C)] 98.3 F (36.8 C) (08/10 0500) Pulse Rate:  [72-89] 72  (08/10 0500) Resp:  [19-20] 19  (08/10 0500) BP: (129-161)/(72-85) 129/76 mmHg (08/10 0500) SpO2:  [95 %-97 %] 96 % (08/10 0500) Last BM Date: 02/10/12  Intake/Output from previous day: 08/09 0701 - 08/10 0700 In: 2300 [P.O.:800; I.V.:1500] Out: 3400 [Urine:3400] Intake/Output this shift:    General appearance: no distress Resp: clear to auscultation bilaterally Cardio: regular rate and rhythm GI: mild distended, bs present, nontender   Assessment/Plan: psbo resolving  He appears to be resolving and will advance to fulls today, if does ok can restart meds tomorrow   LOS: 6 days    [facility] 02/11/2012

## 2012-02-12 MED ORDER — DILTIAZEM HCL ER COATED BEADS 120 MG PO CP24
120.0000 mg | ORAL_CAPSULE | Freq: Two times a day (BID) | ORAL | Status: DC
  Administered 2012-02-12 (×2): 120 mg via ORAL
  Filled 2012-02-12 (×4): qty 1

## 2012-02-12 NOTE — Progress Notes (Signed)
  Subjective: Hungry; had another bowel movement Abdomen feels fine  Objective: Vital signs in last 24 hours: Temp:  [98.1 F (36.7 C)-98.5 F (36.9 C)] 98.2 F (36.8 C) (08/11 0500) Pulse Rate:  [84-93] 90  (08/11 0500) Resp:  [18-20] 18  (08/11 0500) BP: (135-137)/(78-82) 137/78 mmHg (08/11 0500) SpO2:  [95 %-96 %] 96 % (08/11 0500) Last BM Date: 02/10/12  Intake/Output from previous day: 08/10 0701 - 08/11 0700 In: 480 [P.O.:480] Out: 2050 [Urine:2050] Intake/Output this shift:    GI: soft, non-tender; bowel sounds normal; no masses,  no organomegaly  Lab Results:  No results found for this basename: WBC:2,HGB:2,HCT:2,PLT:2 in the last 72 hours BMET No results found for this basename: NA:2,K:2,CL:2,CO2:2,GLUCOSE:2,BUN:2,CREATININE:2,CALCIUM:2 in the last 72 hours PT/INR No results found for this basename: LABPROT:2,INR:2 in the last 72 hours ABG No results found for this basename: PHART:2,PCO2:2,PO2:2,HCO3:2 in the last 72 hours  Studies/Results: No results found.  Anti-infectives: Anti-infectives    None      Assessment/Plan: s/p * No surgery found * Advance diet Plan for discharge tomorrow Saline lock D/C Foley - back on Flomax Restart all home meds   LOS: 7 days    Channing Yeager K. 02/12/2012

## 2012-02-13 NOTE — Progress Notes (Signed)
Discharge instructions reviewed with pt and pt's wife.  Demonstrated emptying of urinary catheter and explained catheter care.  Pt and wife verbalized understanding and had no questions.  Pt discharged in stable condition via wheelchair with wife.  Hector Shade [place]

## 2012-02-13 NOTE — Discharge Summary (Signed)
Physician Discharge Summary  Patient ID: [name] MRN: [id number] DOB/AGE: 07-04-1941 71 y.o.  Admit date: 02/05/2012 Discharge date: 02/13/2012  Admission Diagnoses: abdominal bloating with nausea and vomiting   Discharge Diagnoses: Small bowel obstruction (resolved) Active Problems:  * No active hospital problems. 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 Imogene Burn, On the day of discharge he started having abdominal bloating with nausea and vomiting. But not a lot of abdominal pain. He does have a History of a previous bowel obstruction in 1961 which was treated surgically through right abdominal incision. He was treated conservitally with bowel rest and IV fluids until he had a return of normal bowel function and was able to tolerate a graduated diet. He is now tolerating a regular diet and is passing flatus and having bowel movements. At this point in time he is deemed stable for discharge to home, but will need follow up with his urologist for his urinary retention issue.   Consults: None  Significant Diagnostic Studies: labs and radiology.  Treatments: IV hydration and analgesia  Discharge Exam: Blood pressure 116/62, pulse 72, temperature 99.3 F (37.4 C), temperature source Oral, resp. rate 18, SpO2 96.00%. General appearance: alert, cooperative, appears stated age and no distress Patient has foley catheter reinserted secondary to urinary retention issues. Abdomen: +BS,flatus,BM. Chest: CTA bilaterally  Disposition: 01-Home or Self Care Follow-up with Dr. Lowella Dandy office, for his urinary retention issues.  Discharge Orders    Future Appointments: Provider: Department: Dept Phone: Center:   03/02/2012 9:45 AM Fransisco Hertz, MD [facility] [phone] [facility]     Medication List  As of 02/13/2012  8:25 AM   ASK your doctor about these medications          aspirin 81 MG EC tablet   Take 81 mg by mouth daily.      clopidogrel 75 MG tablet   Commonly known as: PLAVIX   Take 1 tablet (75 mg total) by mouth daily.      diltiazem 120 MG tablet   Commonly known as: CARDIZEM   Take 1 tablet (120 mg total) by mouth 2 (two) times daily.      omeprazole 20 MG capsule   Commonly known as: PRILOSEC   Take 1 capsule (20 mg total) by mouth daily.      pravastatin 40 MG tablet   Commonly known as: PRAVACHOL   Take 0.5 tablets (20 mg total) by mouth daily.      Tamsulosin HCl 0.4 MG Caps   Commonly known as: FLOMAX   Take 0.4 mg by mouth daily.             SignedBlenda Mounts 02/13/2012, 8:25 AM

## 2012-02-13 NOTE — Progress Notes (Signed)
Patient ID: [name], male   DOB: [birth year], 71 y.o.   MRN: [id number]    Subjective: Sitting up at bedside, eating regular diet, + flatus, BM. Still unable to void on own, req in and out cath last night. (has been on flomax since 02/10/12)    Objective: Vital signs in last 24 hours: Temp:  [97.4 F (36.3 C)-99.3 F (37.4 C)] 99.3 F (37.4 C) (08/12 0500) Pulse Rate:  [72-97] 72  (08/12 0500) Resp:  [16-18] 18  (08/12 0500) BP: (116-174)/(62-97) 116/62 mmHg (08/12 0500) SpO2:  [95 %-96 %] 96 % (08/12 0500) Last BM Date: 02/10/12  Intake/Output from previous day: 08/11 0701 - 08/12 0700 In: 850 [P.O.:850] Out: 1500 [Urine:1500] Intake/Output this shift:    GI: soft, non-tender; bowel sounds normal; no masses,  no organomegaly +BS, + flatus, + BM Still req in-out cath to urinate (cath'd at 0230am last night). Running low grade temp of 99.3 this am.   Lab Results:  No results found for this basename: WBC:2,HGB:2,HCT:2,PLT:2 in the last 72 hours BMET No results found for this basename: NA:2,K:2,CL:2,CO2:2,GLUCOSE:2,BUN:2,CREATININE:2,CALCIUM:2 in the last 72 hours PT/INR No results found for this basename: LABPROT:2,INR:2 in the last 72 hours ABG No results found for this basename: PHART:2,PCO2:2,PO2:2,HCO3:2 in the last 72 hours  Studies/Results: No results found.  Anti-infectives: Anti-infectives    None      Assessment/Plan: s/p * No surgery found *  Restarted all home meds Will speak with Dr. Manon Hilding ? Plan  (patient unable to void on own still req in-out cath to void). On Flomax since 02/10/12. Will still plan to discharge today. Will re-insert foley.  He will need to follow up with Dr. Manon Hilding for this issue.   LOS: 8 days    [name] 02/13/2012

## 2012-03-01 ENCOUNTER — Encounter: Payer: Self-pay | Admitting: Vascular Surgery

## 2012-03-02 ENCOUNTER — Encounter: Payer: Self-pay | Admitting: Vascular Surgery

## 2012-03-02 ENCOUNTER — Ambulatory Visit (INDEPENDENT_AMBULATORY_CARE_PROVIDER_SITE_OTHER): Payer: Medicare Other | Admitting: Vascular Surgery

## 2012-03-02 VITALS — BP 147/61 | HR 82 | Resp 20 | Ht 70.0 in | Wt 187.0 lb

## 2012-03-02 DIAGNOSIS — Z48812 Encounter for surgical aftercare following surgery on the circulatory system: Secondary | ICD-10-CM

## 2012-03-02 DIAGNOSIS — I70219 Atherosclerosis of native arteries of extremities with intermittent claudication, unspecified extremity: Secondary | ICD-10-CM

## 2012-03-02 MED ORDER — CLOPIDOGREL BISULFATE 75 MG PO TABS: 75.0000 mg | ORAL_TABLET | Freq: Every day | ORAL | Status: AC

## 2012-03-02 NOTE — Progress Notes (Signed)
[facility] [place]  Postoperative Visit  History of Present Illness  [name] is a 71 y.o. 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  The patient never had wounds.  The patient notes improvement in lower extremity symptoms.  The patient is able to complete their activities of daily living.  Postoperatively, the patient had pSBO managed with non-operative management.  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  He is under Urology care at this point.  Past Medical History, Past Surgical History, Social History, Family History, Medications, Allergies, and Review of Systems are unchanged from previous evaluation on 02/02/12.  Physical Examination  Filed Vitals:   03/02/12 0944  BP: 147/61  Pulse: 82  Resp: 20  Height: 5\' 10"  (1.778 m)  Weight: 187 lb (84.823 kg)   Body mass index is 26.83 kg/(m^2).  General: A&O x 3, WDWN  Pulmonary: Sym exp, good air movt, CTAB, no rales, rhonchi, & wheezing  Cardiac: RRR, Nl S1, S2, no Murmurs, rubs or gallops  Vascular: Vessel Right Left  Radial Palpable Palpable  Ulnary Palpable Palpable  Brachial Palpable Palpable  Carotid Palpable, without bruit Palpable, without bruit  Aorta Non-palpable N/A  Femoral Strongly Palpable Palpable  Popliteal Non-palpable Non-palpable  PT Palpable Faintly Palpable  DP Non-Palpable Non-Palpable   Gastrointestinal: soft, NTND, -G/R, - HSM, - masses, - CVAT B  Musculoskeletal: M/S 5/5 throughout , Extremities without ischemic changes , L groin without hematoma, no echymosis present at cannulation site  Neurologic:  Pain and light touch intact in extremities , Motor exam as listed above  Medical Decision Making  [name] is a 71 y.o. male who presents s/p Viabahn placement at R CIA for PAUx3 and stenosis. Based on his angiographic findings, this patient  needs: L SFA OA+PTA in future. Given patient is still recovering from his pSBO and urinary retention, I would hold off on immediate intervention. Repeat ABI and Aortoiliac duplex in 3 months. I also gave the pt a prescription for Plavix, which he will take in place of aspirin. I discussed in depth with the patient the nature of atherosclerosis, and emphasized the importance of maximal medical management including strict control of blood pressure, blood glucose, and lipid levels, obtaining regular exercise, and cessation of smoking.  The patient is aware that without maximal medical management the underlying atherosclerotic disease process will progress, limiting the benefit of any interventions.  Thank you for allowing [place] to participate in this patient's care.  Leonides Sake, MD [facility] [place] Office: [phone] Pager: [phone]

## 2012-05-09 ENCOUNTER — Ambulatory Visit (INDEPENDENT_AMBULATORY_CARE_PROVIDER_SITE_OTHER): Payer: Medicare Other

## 2012-05-09 DIAGNOSIS — Z23 Encounter for immunization: Secondary | ICD-10-CM

## 2012-06-07 ENCOUNTER — Encounter: Payer: Self-pay | Admitting: Vascular Surgery

## 2012-06-08 ENCOUNTER — Ambulatory Visit (INDEPENDENT_AMBULATORY_CARE_PROVIDER_SITE_OTHER): Payer: Medicare Other | Admitting: Vascular Surgery

## 2012-06-08 ENCOUNTER — Encounter: Payer: Self-pay | Admitting: Vascular Surgery

## 2012-06-08 ENCOUNTER — Ambulatory Visit (INDEPENDENT_AMBULATORY_CARE_PROVIDER_SITE_OTHER): Payer: Medicare Other | Admitting: Family Medicine

## 2012-06-08 ENCOUNTER — Other Ambulatory Visit (INDEPENDENT_AMBULATORY_CARE_PROVIDER_SITE_OTHER): Payer: Medicare Other | Admitting: *Deleted

## 2012-06-08 ENCOUNTER — Encounter: Payer: Self-pay | Admitting: Family Medicine

## 2012-06-08 ENCOUNTER — Encounter (INDEPENDENT_AMBULATORY_CARE_PROVIDER_SITE_OTHER): Payer: Medicare Other | Admitting: *Deleted

## 2012-06-08 VITALS — BP 152/77 | HR 91 | Ht 70.0 in | Wt 188.7 lb

## 2012-06-08 VITALS — BP 138/60 | HR 78 | Temp 98.2°F | Wt 189.0 lb

## 2012-06-08 DIAGNOSIS — I739 Peripheral vascular disease, unspecified: Secondary | ICD-10-CM

## 2012-06-08 DIAGNOSIS — I70219 Atherosclerosis of native arteries of extremities with intermittent claudication, unspecified extremity: Secondary | ICD-10-CM

## 2012-06-08 DIAGNOSIS — R05 Cough: Secondary | ICD-10-CM

## 2012-06-08 DIAGNOSIS — Z48812 Encounter for surgical aftercare following surgery on the circulatory system: Secondary | ICD-10-CM

## 2012-06-08 MED ORDER — BENZONATATE 200 MG PO CAPS: 200.0000 mg | ORAL_CAPSULE | Freq: Three times a day (TID) | ORAL | Status: AC | PRN

## 2012-06-08 NOTE — Progress Notes (Signed)
[facility] [place]   Postoperative Visit  History of Present Illness  [name] is a 71 y.o. 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 The patient notes no intermittent claudication and is essential asx at this point.  The patient is able to complete their activities of daily living. He denies any wounds or other foot complications.  Past Medical History, Past Surgical History, Social History, Family History, Medications, Allergies, and Review of Systems are unchanged from previous evaluation on 03/02/12.   Physical Examination  Filed Vitals:   06/08/12 1040  BP: 152/77  Pulse: 91  Height: 5\' 10"  (1.778 m)  Weight: 188 lb 11.2 oz (85.594 kg)  SpO2: 95%    General: A&O x 3, WDWN   Pulmonary: Sym exp, good air movt, CTAB, no rales, rhonchi, & wheezing   Cardiac: RRR, Nl S1, S2, no Murmurs, rubs or gallops   Vascular:  Vessel  Right  Left   Radial  Palpable  Palpable   Ulnary  Palpable  Palpable   Brachial  Palpable  Palpable   Carotid  Palpable, without bruit  Palpable, without bruit   Aorta  Non-palpable  N/A   Femoral  Strongly Palpable  Palpable   Popliteal  Non-palpable  Non-palpable   PT  Palpable  Faintly Palpable   DP  Non-Palpable  Non-Palpable    Gastrointestinal: soft, NTND, -G/R, - HSM, - masses, - CVAT B   Musculoskeletal: M/S 5/5 throughout , Extremities without ischemic changes   Neurologic: Pain and light touch intact in extremities , Motor exam as listed above   Non-Invasive Vascular Imaging  ABI (Date: 06/08/12)  RLE: 1.02, PT: biphasic, DP: monophasic  LLE: 0.87, PT: biphasic, DP: monophasic  AORTOILIAC Duplex (Date: 06/08/12)  Ao: 64-70 c/s  R iliac: patent CIA stent, EIA: 85-133 c/s  L iliac: CIA 135-213, EIA 62-133 c/s  Medical Decision Making  [name] is a 71 y.o. male who presents s/p Viabahn placement at R CIA for PAUx3 and  stenosis.  Given he is completely asx, it was difficult to convince the patient and myself to proceed with the L SFA OA+PTA, though there is a 75% stenosis in the L SFA. He elected to hold off another 3 months.  We will repeat the ABI and aortoiliac duplex at that time We discussed the signs and symptoms of a left superficial femoral artery occlusion and he will follow up with [place] earlier if he develops any of these symptoms. I discussed in depth with the patient the nature of atherosclerosis, and emphasized the importance of maximal medical management including strict control of blood pressure, blood glucose, and lipid levels, obtaining regular exercise, and cessation of smoking. The patient is aware that without maximal medical management the underlying atherosclerotic disease process will progress, limiting the benefit of any interventions. Thank you for allowing [place] to participate in this patient's care.  Leonides Sake, MD  [facility] [place]  Office: [phone]  Pager: [phone]

## 2012-06-08 NOTE — Patient Instructions (Addendum)
Take the tessalon if needed for the cough and this should gradually get better.  Take care.

## 2012-06-08 NOTE — Progress Notes (Signed)
"  I got an awful cold.  Started in my chest, going to my head." cough with sputum.  Occ wheeze prev.  Waking with cough.  Head is stuffy and congested.  No fevers.  Sx started about 1 week ago.  Sometimes the sx wax and wane.  Cough comes in fits but is noted to be better today.    Still doing self cath BID.    Meds, vitals, and allergies reviewed.   ROS: See HPI.  Otherwise, noncontributory.  GEN: nad, alert and oriented, nontoxic.  HEENT: mucous membranes moist, tm w/o erythema, nasal exam w/o erythema, clear discharge noted,  OP with cobblestoning, sinuses not ttp x4 NECK: supple w/o LA CV: rrr.   PULM: totally ctab, no inc wob, no wheeze EXT: no edema

## 2012-06-10 DIAGNOSIS — R05 Cough: Secondary | ICD-10-CM | POA: Insufficient documentation

## 2012-06-10 NOTE — Assessment & Plan Note (Signed)
Nontoxic, with improving cough today.  Ctab, no inc in wob.  Appears to be resolving. Would use tesslaon prn and this should continue to improve.  D/w pt, he agrees.

## 2012-06-12 ENCOUNTER — Telehealth: Payer: Self-pay | Admitting: *Deleted

## 2012-06-12 NOTE — Telephone Encounter (Signed)
Thanks, please notify pt (letter or call) that he should get one at his convenience.  Thanks.

## 2012-06-12 NOTE — Telephone Encounter (Signed)
Patient advised.

## 2012-06-12 NOTE — Telephone Encounter (Signed)
Dr. Para March asked that I phone [facility] to find out if this patient has received a pneumonia vaccine since the age of 72.  Medical records at that office checked the paper chart and says there is no record of a pneumonia vaccine.  I also checked [facility], no record of the vaccine.

## 2012-06-21 ENCOUNTER — Emergency Department (HOSPITAL_COMMUNITY): Payer: Medicare Other

## 2012-06-21 ENCOUNTER — Encounter (HOSPITAL_COMMUNITY): Payer: Self-pay | Admitting: *Deleted

## 2012-06-21 ENCOUNTER — Encounter: Payer: Self-pay | Admitting: Family Medicine

## 2012-06-21 ENCOUNTER — Emergency Department (HOSPITAL_COMMUNITY)
Admission: EM | Admit: 2012-06-21 | Discharge: 2012-06-21 | Disposition: A | Discharge status: Home / Self Care | Payer: Medicare Other | Attending: Emergency Medicine | Admitting: Emergency Medicine

## 2012-06-21 ENCOUNTER — Ambulatory Visit (INDEPENDENT_AMBULATORY_CARE_PROVIDER_SITE_OTHER): Payer: Medicare Other | Admitting: Family Medicine

## 2012-06-21 VITALS — BP 150/70 | HR 130 | Temp 99.5°F | Wt 186.0 lb

## 2012-06-21 DIAGNOSIS — Z8719 Personal history of other diseases of the digestive system: Secondary | ICD-10-CM | POA: Insufficient documentation

## 2012-06-21 DIAGNOSIS — R059 Cough, unspecified: Secondary | ICD-10-CM

## 2012-06-21 DIAGNOSIS — Z8739 Personal history of other diseases of the musculoskeletal system and connective tissue: Secondary | ICD-10-CM | POA: Insufficient documentation

## 2012-06-21 DIAGNOSIS — R509 Fever, unspecified: Secondary | ICD-10-CM | POA: Insufficient documentation

## 2012-06-21 DIAGNOSIS — Z7902 Long term (current) use of antithrombotics/antiplatelets: Secondary | ICD-10-CM | POA: Insufficient documentation

## 2012-06-21 DIAGNOSIS — R000 Tachycardia, unspecified: Secondary | ICD-10-CM

## 2012-06-21 DIAGNOSIS — E785 Hyperlipidemia, unspecified: Secondary | ICD-10-CM | POA: Insufficient documentation

## 2012-06-21 DIAGNOSIS — J3489 Other specified disorders of nose and nasal sinuses: Secondary | ICD-10-CM | POA: Insufficient documentation

## 2012-06-21 DIAGNOSIS — Z862 Personal history of diseases of the blood and blood-forming organs and certain disorders involving the immune mechanism: Secondary | ICD-10-CM | POA: Insufficient documentation

## 2012-06-21 DIAGNOSIS — R05 Cough: Secondary | ICD-10-CM | POA: Insufficient documentation

## 2012-06-21 DIAGNOSIS — Z8639 Personal history of other endocrine, nutritional and metabolic disease: Secondary | ICD-10-CM | POA: Insufficient documentation

## 2012-06-21 DIAGNOSIS — Z87891 Personal history of nicotine dependence: Secondary | ICD-10-CM | POA: Insufficient documentation

## 2012-06-21 DIAGNOSIS — J40 Bronchitis, not specified as acute or chronic: Secondary | ICD-10-CM | POA: Insufficient documentation

## 2012-06-21 DIAGNOSIS — R5381 Other malaise: Secondary | ICD-10-CM | POA: Insufficient documentation

## 2012-06-21 DIAGNOSIS — I10 Essential (primary) hypertension: Secondary | ICD-10-CM | POA: Insufficient documentation

## 2012-06-21 DIAGNOSIS — Z8709 Personal history of other diseases of the respiratory system: Secondary | ICD-10-CM | POA: Insufficient documentation

## 2012-06-21 DIAGNOSIS — Z79899 Other long term (current) drug therapy: Secondary | ICD-10-CM | POA: Insufficient documentation

## 2012-06-21 DIAGNOSIS — Z8679 Personal history of other diseases of the circulatory system: Secondary | ICD-10-CM | POA: Insufficient documentation

## 2012-06-21 DIAGNOSIS — K219 Gastro-esophageal reflux disease without esophagitis: Secondary | ICD-10-CM | POA: Insufficient documentation

## 2012-06-21 LAB — CBC WITH DIFFERENTIAL/PLATELET
Basophils Absolute: 0 10*3/uL (ref 0.0–0.1)
Eosinophils Relative: 0 % (ref 0–5)
HCT: 39.9 % (ref 39.0–52.0)
Lymphocytes Relative: 6 % — ABNORMAL LOW (ref 12–46)
MCH: 28.8 pg (ref 26.0–34.0)
MCHC: 33.6 g/dL (ref 30.0–36.0)
MCV: 85.8 fL (ref 78.0–100.0)
Monocytes Absolute: 1 10*3/uL (ref 0.1–1.0)
RDW: 13.5 % (ref 11.5–15.5)
WBC: 12.6 10*3/uL — ABNORMAL HIGH (ref 4.0–10.5)

## 2012-06-21 LAB — BASIC METABOLIC PANEL
CO2: 28 mEq/L (ref 19–32)
Calcium: 9.4 mg/dL (ref 8.4–10.5)
Creatinine, Ser: 0.93 mg/dL (ref 0.50–1.35)

## 2012-06-21 MED ORDER — MUCINEX DM 30-600 MG PO TB12: 1.0000 | ORAL_TABLET | Freq: Two times a day (BID) | ORAL | Status: DC

## 2012-06-21 MED ORDER — ALBUTEROL SULFATE HFA 108 (90 BASE) MCG/ACT IN AERS: 1.0000 | INHALATION_SPRAY | Freq: Four times a day (QID) | RESPIRATORY_TRACT | Status: DC | PRN

## 2012-06-21 NOTE — ED Notes (Signed)
Patient was brought from [place] office due to productive cough, yellow sputum, generalized weakness/dehyrdation.  He is here for eval of pneumonia.  Patient denies pain.  Patient sx originally started 2 days ago

## 2012-06-21 NOTE — ED Notes (Signed)
Patient reported to have a fever today at MD office.  Patient reports he has been eating and drinking but has had decreased appetite

## 2012-06-21 NOTE — ED Provider Notes (Signed)
History     CSN: [id number]  Arrival date & time 06/21/12  1252   First MD Initiated Contact with Patient 06/21/12 1548      Chief Complaint  Patient presents with  . Shortness of Breath    (Consider location/radiation/quality/duration/timing/severity/associated sxs/prior treatment) Patient is a 71 y.o. male presenting with shortness of breath. The history is provided by the patient and the spouse.  Shortness of Breath  Associated symptoms include a fever, cough and shortness of breath. Pertinent negatives include no chest pain.   patient with three-week history of cough worse in the past 3 days. Now productive. Yellow sputum. Association there was some generalized weakness. Patient was seen at primary care's office today for the cough fair he was hypoxic on room air some measurement below 90% we don't know the specifics. He was tachycardic up to around 120 and had a fever. Patient did not have any of those symptoms at home. Patient states that he's had the occasional productive sputum. Denied fever.  Past Medical History  Diagnosis Date  . Hyperlipidemia   . Hypertension   . OAD (obstructive airway disease)   . GERD (gastroesophageal reflux disease)   . Diverticulosis   . DDD (degenerative disc disease)     cervical, followed by [facility] 2012  . ED (erectile dysfunction)     no relief with cialis, etc  . Back pain     per [facility]  . Thyroid nodule   . Anemia   . Irregular heartbeat     Past Surgical History  Procedure Date  . Small intestine surgery   . Skin graft   . Tonsillectomy   . Polypectomy   . Colonoscopy     Family History  Problem Relation Age of Onset  . Heart attack Mother   . Heart disease Mother   . Hyperlipidemia Mother   . Stroke Father   . Heart attack Father   . Heart disease Father   . Hyperlipidemia Father   . Hypertension Father   . Other Father     varicose veins  . Heart attack Brother   . Prostate cancer Brother   . Cancer Brother   .  Other Brother     varicose veins  . Heart attack Brother   . Heart disease Brother   . Hyperlipidemia Brother   . Other Brother     varicose veins  . Breast cancer Sister   . Diabetes Sister   . Cancer Sister   . Heart disease Brother   . Hyperlipidemia Brother   . Hypertension Brother   . Heart attack Brother     History  Substance Use Topics  . Smoking status: Former Smoker    Types: Cigarettes    Quit date: 02/28/1982  . Smokeless tobacco: Never Used     Comment: Quit in 40's   . Alcohol Use: 7.2 oz/week    12 Cans of beer per week     Comment: two 6 pack a week of beer      Review of Systems  Constitutional: Positive for fever.  HENT: Positive for congestion.   Respiratory: Positive for cough and shortness of breath.   Cardiovascular: Negative for chest pain.  Gastrointestinal: Negative for nausea, vomiting and abdominal pain.  Genitourinary: Negative for dysuria.  Musculoskeletal: Negative for back pain.  Skin: Negative for rash.  Neurological: Negative for headaches.  Hematological: Does not bruise/bleed easily.    Allergies  Benazepril hcl and Hydrochlorothiazide  Home Medications  Current Outpatient Rx  Name  Route  Sig  Dispense  Refill  . CLOPIDOGREL BISULFATE 75 MG PO TABS   Oral   Take 1 tablet (75 mg total) by mouth daily.   30 tablet   11   . DILTIAZEM HCL 120 MG PO TABS   Oral   Take 1 tablet (120 mg total) by mouth 2 (two) times daily.   180 tablet   3   . FINASTERIDE 5 MG PO TABS   Oral   Take 5 mg by mouth daily.          Marland Kitchen OMEPRAZOLE 20 MG PO CPDR   Oral   Take 1 capsule (20 mg total) by mouth daily.   90 capsule   3   . PRAVASTATIN SODIUM 40 MG PO TABS   Oral   Take 0.5 tablets (20 mg total) by mouth daily.   45 tablet   3   . TAMSULOSIN HCL 0.4 MG PO CAPS   Oral   Take 0.4 mg by mouth daily.          . ALBUTEROL SULFATE HFA 108 (90 BASE) MCG/ACT IN AERS   Inhalation   Inhale 1-2 puffs into the lungs every 6  (six) hours as needed for wheezing.   1 Inhaler   0   . MUCINEX DM 30-600 MG PO TB12   Oral   Take 1 tablet by mouth every 12 (twelve) hours.   28 each   0     BP 141/65  Pulse 101  Temp 98 F (36.7 C) (Oral)  Resp 21  SpO2 98%  Physical Exam  Nursing note and vitals reviewed. Constitutional: He is oriented to person, place, and time. He appears well-developed and well-nourished. No distress.  HENT:  Head: Normocephalic and atraumatic.  Mouth/Throat: Oropharynx is clear and moist.  Eyes: Conjunctivae normal and EOM are normal. Pupils are equal, round, and reactive to light.  Neck: Normal range of motion. Neck supple.  Cardiovascular: Normal rate, regular rhythm and normal heart sounds.   No murmur heard.      Slightly tachycardic.  Pulmonary/Chest: Effort normal and breath sounds normal. No respiratory distress. He has no wheezes. He has no rales.  Abdominal: Bowel sounds are normal. There is no tenderness.  Musculoskeletal: Normal range of motion. He exhibits no edema.  Neurological: He is alert and oriented to person, place, and time. No cranial nerve deficit. He exhibits normal muscle tone. Coordination normal.  Skin: Skin is warm. No rash noted.    ED Course  Procedures (including critical care time)  Labs Reviewed  CBC WITH DIFFERENTIAL - Abnormal; Notable for the following:    WBC 12.6 (*)     Neutrophils Relative 86 (*)     Neutro Abs 10.9 (*)     Lymphocytes Relative 6 (*)     All other components within normal limits  BASIC METABOLIC PANEL - Abnormal; Notable for the following:    Glucose, Bld 115 (*)     GFR calc non Af Amer 82 (*)     All other components within normal limits   Dg Chest 2 View  06/21/2012  *RADIOLOGY REPORT*  Clinical Data: Cough, shortness of breath  CHEST - 2 VIEW  Comparison: 02/07/2012  Findings: Cardiomediastinal silhouette is stable.  No acute infiltrate or pleural effusion.  No pulmonary edema.  Stable mild degenerative changes  thoracic spine.  Again noted mild elevation of the right hemidiaphragm.  IMPRESSION: No active disease.  No significant change.   Original Report Authenticated By: Natasha Mead, M.D.      1. Bronchitis       MDM   Patient sent in from primary care's office. Patient had a coughing episode there reportedly had a fever also was hypoxic to room air below 90% we don't know if the percentage. And was tachycardic with a heart rate up to 120. Patient's had a persistent cough for 3 weeks worse in the last 3 days that's why patient made appointment with primary care provider.  Here in the emergency apartment without any specific treatment patient is remarkably improved. Afebrile was not given any Tylenol or Motrin. Room air saturations have been 93% or better. Heart rate still slightly tachycardic at 100-103 but nothing significant. No evidence of any arrhythmia. Patient's chest x-rays negative for pneumonia. Patient has had the flu shot.  Suspect that patient's symptoms are related to a bronchitis possibly onset of a flulike illness. Patient will be discharged home with Mucinex DM for the cough and albuterol inhaler for the cough no wheezing here today. Patient will return for new or worse symptoms.        Shelda Jakes, MD 06/21/12 1700

## 2012-06-21 NOTE — ED Notes (Signed)
Patient has been resting  No s/sx of distress.  Wife has remained at bedside.

## 2012-06-21 NOTE — Progress Notes (Signed)
Was seen 2 weeks ago and was improving and lungs were clear; he likely had a self resolving illness.  Tessalon given at that time didn't help much for the cough.   In the interval he was improving but then the cough got worse in last 2-3 days.  With sputum.  Abnormal temp noted today in clinic.  More wheeze recently.   Meds, vitals, and allergies reviewed.   ROS: See HPI.  Otherwise, noncontributory.  Mildly SOB ncat Mmm Tachy Diffuse dec in BS in lung fields abd soft Ext w/o edema  Pulse ox initally low 90s- to ~96% on 2L O2.

## 2012-06-21 NOTE — Assessment & Plan Note (Signed)
Concern for PNA.  EKG pending given the tachycardia.  On O2.  EMS promptly called and in route.  D/w pt and he agrees with EMS transport.

## 2012-06-21 NOTE — ED Notes (Signed)
Patient is resting.  No complaints at this time.  He remains on cardiac monitoring.  Family at bedside.  Ice water provided per ermd ok

## 2012-06-21 NOTE — ED Notes (Signed)
Family at bedside. 

## 2012-08-08 ENCOUNTER — Other Ambulatory Visit: Payer: Self-pay | Admitting: *Deleted

## 2012-08-08 MED ORDER — PRAVASTATIN SODIUM 40 MG PO TABS: 20.0000 mg | ORAL_TABLET | Freq: Every day | ORAL | Status: DC

## 2012-09-13 ENCOUNTER — Encounter: Payer: Self-pay | Admitting: Vascular Surgery

## 2012-09-14 ENCOUNTER — Encounter (INDEPENDENT_AMBULATORY_CARE_PROVIDER_SITE_OTHER): Payer: Medicare Other | Admitting: *Deleted

## 2012-09-14 ENCOUNTER — Other Ambulatory Visit (INDEPENDENT_AMBULATORY_CARE_PROVIDER_SITE_OTHER): Payer: Medicare Other | Admitting: *Deleted

## 2012-09-14 ENCOUNTER — Ambulatory Visit (INDEPENDENT_AMBULATORY_CARE_PROVIDER_SITE_OTHER): Payer: Medicare Other | Admitting: Vascular Surgery

## 2012-09-14 ENCOUNTER — Encounter: Payer: Self-pay | Admitting: Vascular Surgery

## 2012-09-14 VITALS — BP 136/83 | HR 78 | Resp 16 | Ht 70.0 in | Wt 193.0 lb

## 2012-09-14 DIAGNOSIS — I739 Peripheral vascular disease, unspecified: Secondary | ICD-10-CM

## 2012-09-14 DIAGNOSIS — Z48812 Encounter for surgical aftercare following surgery on the circulatory system: Secondary | ICD-10-CM

## 2012-09-14 NOTE — Progress Notes (Signed)
[facility] [place]  Established Intermittent Claudication  History of Present Illness  [name] is a 72 y.o. 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  The patient's symptoms have not progressed.  The patient's symptoms are: essentially asx.  The patient's treatment regimen currently included: maximal medical management and walking plan.  The pt notes no ulcers or any gangrene.  Past Medical History, Past Surgical History, Social History, Family History, Medications, Allergies, and Review of Systems are unchanged from previous evaluation on 06/08/12.  Physical Examination  Filed Vitals:   09/14/12 0942  BP: 136/83  Pulse: 78  Resp: 16  Height: 5\' 10"  (1.778 m)  Weight: 193 lb (87.544 kg)  SpO2: 96%   Body mass index is 27.69 kg/(m^2).  General: A&O x 3, WDWN  Pulmonary: Sym exp, good air movt, CTAB, no rales, rhonchi, & wheezing  Cardiac: RRR, Nl S1, S2, no Murmurs, rubs or gallops  Vascular: Vessel Right Left  Radial Palpable Palpable  Ulnar Palpable Palpable  Brachial Palpable Palpable  Carotid Palpable, without bruit Palpable, without bruit  Aorta Not palpable N/A  Femoral Palpable Palpable  Popliteal Not palpable Not palpable  PT Palpable Not Palpable  DP Not Palpable Faintly Palpable   Musculoskeletal: M/S 5/5 throughout , Extremities without ischemic changes   Neurologic: Pain and light touch intact in extremities , Motor exam as listed above  Non-Invasive Vascular Imaging ABI (Date: 09/14/12)  RLE: 0.99, PT: biphasic, DP: mono  LLE: 0.86; PT: mono, DP: mono  Aortoiliac Duplex (Date: 09/14/12)  Ao: 83 c/s (dampened)  R iliac: 57-182 c/s (148-176 within stent)  L iliac: not checked  R CFA: 110 c/s (biphasic)  L CFA: 113 c/s (biphasic)  Medical Decision Making  [name] is a 72 y.o. male who presents with: BLE asx PAD, s/p R CIA  stenting  Based on the patient's vascular studies and examination, I have offered the patient: continued surveillance q 6months with aortoiliac duplex and BLE ABI..  I discussed in depth with the patient the nature of atherosclerosis, and emphasized the importance of maximal medical management including strict control of blood pressure, blood glucose, and lipid levels, antiplatelet agents, obtaining regular exercise, and cessation of smoking.  The patient is aware that without maximal medical management the underlying atherosclerotic disease process will progress, limiting the benefit of any interventions.  Thank you for allowing [place] to participate in this patient's care.  Chad Sake, MD [facility] [place] Office: [phone] Pager: [phone]  09/14/2012, 11:30 AM

## 2012-09-14 NOTE — Addendum Note (Signed)
Addended by: Adria Dill L on: 09/14/2012 12:52 PM   Modules accepted: Orders

## 2012-09-24 ENCOUNTER — Ambulatory Visit (INDEPENDENT_AMBULATORY_CARE_PROVIDER_SITE_OTHER): Payer: Medicare Other | Admitting: Family Medicine

## 2012-09-24 ENCOUNTER — Encounter: Payer: Self-pay | Admitting: Family Medicine

## 2012-09-24 VITALS — BP 142/84 | HR 90 | Temp 97.4°F | Wt 195.0 lb

## 2012-09-24 DIAGNOSIS — I70219 Atherosclerosis of native arteries of extremities with intermittent claudication, unspecified extremity: Secondary | ICD-10-CM

## 2012-09-24 DIAGNOSIS — E7800 Pure hypercholesterolemia, unspecified: Secondary | ICD-10-CM

## 2012-09-24 DIAGNOSIS — R21 Rash and other nonspecific skin eruption: Secondary | ICD-10-CM | POA: Insufficient documentation

## 2012-09-24 LAB — LIPID PANEL
Cholesterol: 135 mg/dL (ref 0–200)
HDL: 32.4 mg/dL — ABNORMAL LOW (ref >39.00)
LDL Cholesterol: 69 mg/dL (ref 0–99)
Triglycerides: 167 mg/dL — ABNORMAL HIGH (ref 0.0–149.0)
VLDL: 33.4 mg/dL (ref 0.0–40.0)

## 2012-09-24 LAB — COMPREHENSIVE METABOLIC PANEL
Alkaline Phosphatase: 64 U/L (ref 39–117)
BUN: 11 mg/dL (ref 6–23)
Glucose, Bld: 97 mg/dL (ref 70–99)
Sodium: 137 mEq/L (ref 135–145)
Total Bilirubin: 0.8 mg/dL (ref 0.3–1.2)

## 2012-09-24 MED ORDER — ALBUTEROL SULFATE HFA 108 (90 BASE) MCG/ACT IN AERS: 1.0000 | INHALATION_SPRAY | Freq: Four times a day (QID) | RESPIRATORY_TRACT | Status: DC | PRN

## 2012-09-24 NOTE — Assessment & Plan Note (Signed)
Chol levels are fine except for mild inc in TG.  Wouldn't change meds at this point.   Continue to work on diet and weight to get the TGs down more. D/w pt about general risk factor reduction.

## 2012-09-24 NOTE — Progress Notes (Signed)
H/o PVD and here to discuss HLD.  Saw vascular clinic recently.   Still on pravastatin 20mg  a day.   Using medications without problems: yes Muscle aches: no Diet compliance: discussed, needs better adherence Exercise: very limited, discussed.  We talked about return to a mild graded inc in walking.   He isn't drinking beer.   Due for labs.    He needed a refill on his SABA.    Meds, vitals, and allergies reviewed.   ROS: See HPI.  Otherwise negative.    GEN: nad, alert and oriented HEENT: mucous membranes moist NECK: supple w/o LA CV: rrr. PULM: ctab, no inc wob ABD: soft, +bs EXT: no edema in BLE; varicose veins notes in L calf SKIN: no acute rash except for some mild superficial excoriation on the L leg (he's been scratching it recently)

## 2012-09-24 NOTE — Assessment & Plan Note (Signed)
No skin breakdown, can use prn OTC hydrocortisone to break itch/scratch cycle.

## 2012-09-24 NOTE — Patient Instructions (Addendum)
Go to the lab on the way out.  We'll contact you with your lab report. Use OTC hydrocortisone for the itching and try not to scratch it.  Gradually walk more for exercise.  Take care.

## 2012-09-25 ENCOUNTER — Encounter: Payer: Self-pay | Admitting: *Deleted

## 2012-10-05 ENCOUNTER — Encounter: Payer: Self-pay | Admitting: Family Medicine

## 2012-10-05 ENCOUNTER — Telehealth: Payer: Self-pay

## 2012-10-05 ENCOUNTER — Ambulatory Visit (INDEPENDENT_AMBULATORY_CARE_PROVIDER_SITE_OTHER): Payer: Medicare Other | Admitting: Family Medicine

## 2012-10-05 VITALS — BP 122/70 | HR 110 | Temp 98.3°F | Ht 67.75 in | Wt 187.5 lb

## 2012-10-05 DIAGNOSIS — R1110 Vomiting, unspecified: Secondary | ICD-10-CM

## 2012-10-05 DIAGNOSIS — R197 Diarrhea, unspecified: Secondary | ICD-10-CM

## 2012-10-05 MED ORDER — PROMETHAZINE HCL 25 MG/ML IJ SOLN
25.0000 mg | Freq: Once | INTRAMUSCULAR | Status: AC
  Administered 2012-10-05: 25 mg via INTRAMUSCULAR

## 2012-10-05 MED ORDER — PROMETHAZINE HCL 25 MG PO TABS: 25.0000 mg | ORAL_TABLET | Freq: Three times a day (TID) | ORAL | Status: DC | PRN

## 2012-10-05 MED ORDER — ONDANSETRON 4 MG PO TBDP: 4.0000 mg | ORAL_TABLET | Freq: Three times a day (TID) | ORAL | Status: DC | PRN

## 2012-10-05 NOTE — Telephone Encounter (Signed)
Pt started with N&V& diarrhea on 10/04/12;last time vomited and diarrhea was about 15 mins ago; pt has hx of diverticulitis. Pt has lower abdominal pain on and off since 10/04/12. No blood in vomitus or BM and no fever. Pt gets hot when vomits. No CP or SOB.Pt has not been able to keep liquids down since 10/04/12 AM.pts wife request appt. Pt scheduled to see Dr Reece Agar today at 10:15 am.

## 2012-10-05 NOTE — Addendum Note (Signed)
Addended by: Josph Macho A on: 10/05/2012 11:12 AM   Modules accepted: Orders

## 2012-10-05 NOTE — Patient Instructions (Addendum)
Phenergan shot today. You have viral gastroenteritis - treat with clear fluids for next few days until feeling better.  Push smalls sips of fluid throughout the day. May use either phenergan tablets if tolerated by mouth or zofran dissolvable tablets as needed for nausea - price out.  Prescriptions for both provided today. If worsening over the weekend, please seek urgent care. Watch for fever, worsening abdominal pain or more trouble with oral intake.  Viral Gastroenteritis Viral gastroenteritis is also known as stomach flu. This condition affects the stomach and intestinal tract. It can cause sudden diarrhea and vomiting. The illness typically lasts 3 to 8 days. Most people develop an immune response that eventually gets rid of the virus. While this natural response develops, the virus can make you quite ill. CAUSES  Many different viruses can cause gastroenteritis, such as rotavirus or noroviruses. You can catch one of these viruses by consuming contaminated food or water. You may also catch a virus by sharing utensils or other personal items with an infected person or by touching a contaminated surface. SYMPTOMS  The most common symptoms are diarrhea and vomiting. These problems can cause a severe loss of body fluids (dehydration) and a body salt (electrolyte) imbalance. Other symptoms may include:  Fever.  Headache.  Fatigue.  Abdominal pain. DIAGNOSIS  Your caregiver can usually diagnose viral gastroenteritis based on your symptoms and a physical exam. A stool sample may also be taken to test for the presence of viruses or other infections. TREATMENT  This illness typically goes away on its own. Treatments are aimed at rehydration. The most serious cases of viral gastroenteritis involve vomiting so severely that you are not able to keep fluids down. In these cases, fluids must be given through an intravenous line (IV). HOME CARE INSTRUCTIONS   Drink enough fluids to keep your urine  clear or pale yellow. Drink small amounts of fluids frequently and increase the amounts as tolerated.  Ask your caregiver for specific rehydration instructions.  Avoid:  Foods high in sugar.  Alcohol.  Carbonated drinks.  Tobacco.  Juice.  Caffeine drinks.  Extremely hot or cold fluids.  Fatty, greasy foods.  Too much intake of anything at one time.  Dairy products until 24 to 48 hours after diarrhea stops.  You may consume probiotics. Probiotics are active cultures of beneficial bacteria. They may lessen the amount and number of diarrheal stools in adults. Probiotics can be found in yogurt with active cultures and in supplements.  Wash your hands well to avoid spreading the virus.  Only take over-the-counter or prescription medicines for pain, discomfort, or fever as directed by your caregiver. Do not give aspirin to children. Antidiarrheal medicines are not recommended.  Ask your caregiver if you should continue to take your regular prescribed and over-the-counter medicines.  Keep all follow-up appointments as directed by your caregiver. SEEK IMMEDIATE MEDICAL CARE IF:   You are unable to keep fluids down.  You do not urinate at least once every 6 to 8 hours.  You develop shortness of breath.  You notice blood in your stool or vomit. This may look like coffee grounds.  You have abdominal pain that increases or is concentrated in one small area (localized).  You have persistent vomiting or diarrhea.  You have a fever.  The patient is a child younger than 3 months, and he or she has a fever.  The patient is a child older than 3 months, and he or she has a fever and  persistent symptoms.  The patient is a child older than 3 months, and he or she has a fever and symptoms suddenly get worse.  The patient is a baby, and he or she has no tears when crying. MAKE SURE YOU:   Understand these instructions.  Will watch your condition.  Will get help right away if  you are not doing well or get worse. Document Released: 06/20/2005 Document Revised: 09/12/2011 Document Reviewed: 04/06/2011 [facility] Patient Information 2013 [place], [place].

## 2012-10-05 NOTE — Telephone Encounter (Signed)
Will see today.  

## 2012-10-05 NOTE — Progress Notes (Signed)
  Subjective:    Patient ID: [name], male    DOB: 11-21-1940, 72 y.o.   MRN: [id number]  HPI CC: nausea/vomiting with diarrhea  1.5 d h/o diarrhea associated with vomiting and nausea.  Some lower abdominal cramps that resolve with BM.  No blood in stool.  Watery diarrhea , several times a day.  Emesis liquid, NBNB.  No fevers/chills, indigestion, urinary changes or dysuria.  Trouble drinking.  Did have 32 oz gatorade yesterday. Hasn't eaten anything that would flare diverticulosis. No sick contacts at home. No new restaurants or food. No recent travel.  H/o diverticulitis in past.  Currently no LLQ pain.  No fevers/chills. H/o SBO 02/2012 resolved with conservative treatment.  Past Medical History  Diagnosis Date  . Hyperlipidemia   . Hypertension   . OAD (obstructive airway disease)   . GERD (gastroesophageal reflux disease)   . Diverticulosis   . DDD (degenerative disc disease)     cervical, followed by [facility] 2012  . ED (erectile dysfunction)     no relief with cialis, etc  . Back pain     per [facility]  . Thyroid nodule   . Anemia   . Irregular heartbeat   . Peripheral vascular disease   . Bronchitis     Past Surgical History  Procedure Laterality Date  . Small intestine surgery    . Skin graft    . Tonsillectomy    . Polypectomy    . Colonoscopy    . Aortogram  Aug. 1, 2013    Review of Systems Per HPI    Objective:   Physical Exam  Nursing note and vitals reviewed. Constitutional: He appears well-developed and well-nourished. No distress.  HENT:  Mouth/Throat: Oropharynx is clear and moist. No oropharyngeal exudate.  Cardiovascular: Normal rate, regular rhythm, normal heart sounds and intact distal pulses.   No murmur heard. Pulmonary/Chest: Effort normal and breath sounds normal. No respiratory distress. He has no wheezes. He has no rales.  Abdominal: Soft. Bowel sounds are normal. He exhibits no distension and no mass. There is no hepatosplenomegaly.  There is tenderness (mild) in the epigastric area. There is no rebound, no guarding and no CVA tenderness.  Mild tenderness to palpation epigastric region as well as right of umbilicus  Musculoskeletal: He exhibits no edema.  Psychiatric: He has a normal mood and affect.       Assessment & Plan:

## 2012-10-05 NOTE — Assessment & Plan Note (Signed)
Given prevalence of acute viral gastroenteritis as well as presentation, anticipate rpt viral gastorenteritis.  Discussed this. Doubt diverticulitis flare. Discussed red flags to return or seek urgent care. Treated with shot of phenergan 25mg  IM today as well as sent home with phenergan tablets or zofran ODT (if unable to tolerate phenergan orally). Pt agrees with plan.

## 2012-10-08 ENCOUNTER — Ambulatory Visit: Payer: Medicare Other | Admitting: Family Medicine

## 2012-11-28 ENCOUNTER — Other Ambulatory Visit: Payer: Self-pay | Admitting: Family Medicine

## 2012-11-28 MED ORDER — OMEPRAZOLE 20 MG PO CPDR: 20.0000 mg | DELAYED_RELEASE_CAPSULE | Freq: Every day | ORAL | Status: DC

## 2013-02-07 ENCOUNTER — Other Ambulatory Visit: Payer: Self-pay | Admitting: Family Medicine

## 2013-03-11 ENCOUNTER — Other Ambulatory Visit: Payer: Self-pay | Admitting: Family Medicine

## 2013-03-21 ENCOUNTER — Encounter: Payer: Self-pay | Admitting: Vascular Surgery

## 2013-03-22 ENCOUNTER — Encounter: Payer: Self-pay | Admitting: Vascular Surgery

## 2013-03-22 ENCOUNTER — Ambulatory Visit (INDEPENDENT_AMBULATORY_CARE_PROVIDER_SITE_OTHER): Payer: Medicare Other | Admitting: Vascular Surgery

## 2013-03-22 ENCOUNTER — Encounter (INDEPENDENT_AMBULATORY_CARE_PROVIDER_SITE_OTHER): Payer: Medicare Other | Admitting: *Deleted

## 2013-03-22 ENCOUNTER — Other Ambulatory Visit (INDEPENDENT_AMBULATORY_CARE_PROVIDER_SITE_OTHER): Payer: Medicare Other | Admitting: *Deleted

## 2013-03-22 VITALS — BP 144/86 | HR 79 | Resp 16 | Ht 68.5 in | Wt 195.0 lb

## 2013-03-22 DIAGNOSIS — I714 Abdominal aortic aneurysm, without rupture, unspecified: Secondary | ICD-10-CM | POA: Insufficient documentation

## 2013-03-22 DIAGNOSIS — I70219 Atherosclerosis of native arteries of extremities with intermittent claudication, unspecified extremity: Secondary | ICD-10-CM

## 2013-03-22 DIAGNOSIS — I739 Peripheral vascular disease, unspecified: Secondary | ICD-10-CM

## 2013-03-22 NOTE — Progress Notes (Signed)
[facility] [place]   Established Intermittent Claudication   History of Present Illness  [name] is 72 y.o. 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 The patient's symptoms have not progressed. The patient's symptoms are: essentially asx. The patient's treatment regimen currently included: maximal medical management and walking plan. The patient notes his walking is limited due to deconditioning.  The pt notes no ulcers or any gangrene.     Past Medical History, Past Surgical History, Social History, Family History, Medications, Allergies, and Review of Systems are unchanged from previous evaluation on 09/14/12.  On ROS: no rest pain, no claudication.  Physical Examination  Filed Vitals:   03/22/13 0952  BP: 144/86  Pulse: 79  Resp: 16  Height: 5' 8.5" (1.74 m)  Weight: 195 lb (88.451 kg)  SpO2: 96%   Body mass index is 29.21 kg/(m^2).  General: A&O x 3, WDWN   Pulmonary: Sym exp, good air movt, CTAB, no rales, rhonchi, & wheezing   Cardiac: RRR, Nl S1, S2, no Murmurs, rubs or gallops   Vascular:  Vessel  Right  Left   Radial  Palpable  Palpable   Ulnar  Palpable  Palpable   Brachial  Palpable  Palpable   Carotid  Palpable, without bruit  Palpable, without bruit   Aorta  Not palpable due to pannus N/A   Femoral  Palpable  Palpable   Popliteal  Not palpable  Not palpable   PT  Not Palpable  Not Palpable   DP  Faintly Palpable  Faintly Palpable    Musculoskeletal: M/S 5/5 throughout , Extremities without ischemic changes   Neurologic: Pain and light touch intact in extremities , Motor exam as listed above   Non-Invasive Vascular Imaging  ABI (Date: 03/22/2013)  R: 1.07 (0.99), DP: bi, PT: mono, TBI: 0.35  L: 0.95 (0.86), DP: bi, PT: mono, TBI: 0.58  Aortoiliac Duplex (Date: 03/22/2013)  Limited exam due to bowel gas  Ao: 49 c/s  R iliac: biphasic and patent  throughout without any obvious stenosis  Medical Decision Making  [name] is 72 y.o. male who presents with: BLE asx PAD, s/p R CIA stenting  Based on the patient's vascular studies and examination, I have offered the patient: continued surveillance q 6months with aortoiliac duplex and BLE ABI..  I discussed in depth with the patient the nature of atherosclerosis, and emphasized the importance of maximal medical management including strict control of blood pressure, blood glucose, and lipid levels, antiplatelet agents, obtaining regular exercise, and cessation of smoking. The patient is aware that without maximal medical management the underlying atherosclerotic disease process will progress, limiting the benefit of any interventions.  Thank you for allowing [place] to participate in this patient's care. The patient is currently on a statin: Pravachol.   The patient is currently on an anti-platelet: Plavix.   Leonides Sake, MD [facility] [place] Office: [phone] Pager: [phone]  03/22/2013, 1:44 PM

## 2013-04-05 ENCOUNTER — Other Ambulatory Visit: Payer: Self-pay | Admitting: *Deleted

## 2013-04-05 DIAGNOSIS — I739 Peripheral vascular disease, unspecified: Secondary | ICD-10-CM

## 2013-04-05 MED ORDER — CLOPIDOGREL BISULFATE 75 MG PO TABS: 75.0000 mg | ORAL_TABLET | Freq: Every day | ORAL | Status: DC

## 2013-04-09 ENCOUNTER — Ambulatory Visit (INDEPENDENT_AMBULATORY_CARE_PROVIDER_SITE_OTHER): Payer: Medicare Other

## 2013-04-09 DIAGNOSIS — Z23 Encounter for immunization: Secondary | ICD-10-CM

## 2013-05-27 ENCOUNTER — Other Ambulatory Visit: Payer: Self-pay | Admitting: Family Medicine

## 2013-06-05 ENCOUNTER — Other Ambulatory Visit: Payer: Self-pay | Admitting: Family Medicine

## 2013-06-11 ENCOUNTER — Encounter: Payer: Self-pay | Admitting: Family Medicine

## 2013-06-11 ENCOUNTER — Ambulatory Visit (INDEPENDENT_AMBULATORY_CARE_PROVIDER_SITE_OTHER): Payer: Medicare Other | Admitting: Family Medicine

## 2013-06-11 VITALS — BP 170/90 | HR 80 | Temp 97.6°F | Wt 203.2 lb

## 2013-06-11 DIAGNOSIS — H9190 Unspecified hearing loss, unspecified ear: Secondary | ICD-10-CM

## 2013-06-11 DIAGNOSIS — I10 Essential (primary) hypertension: Secondary | ICD-10-CM

## 2013-06-11 DIAGNOSIS — H9193 Unspecified hearing loss, bilateral: Secondary | ICD-10-CM

## 2013-06-11 NOTE — Patient Instructions (Signed)
Call if you want to go to the ear clinic about hearing aids.  Check your BP and pulse at home and let me know about the numbers in a few days.  Check your BP in the morning before you eat.  Cut back on pork.  Take care.

## 2013-06-11 NOTE — Progress Notes (Signed)
Pre-visit discussion using our clinic review tool. No additional management support is needed unless otherwise documented below in the visit note.  HOH per patient.  Concern for cerumen in canals.  No ear pain.    Compliant with BP meds.  No CP.  No BLE edema.  Recheck BP 160/80.    Meds, vitals, and allergies reviewed.   ROS: See HPI.  Otherwise, noncontributory.  nad ncat Tm w/o redness B but B cerumen impaction.  Resolved (with curette by MD) fully on L side, mostly resolved on the R side.  Tolerated well.  No complications.  rrr ctab No edema Rinne and weber wnl B

## 2013-06-12 DIAGNOSIS — H9190 Unspecified hearing loss, unspecified ear: Secondary | ICD-10-CM | POA: Insufficient documentation

## 2013-06-12 NOTE — Assessment & Plan Note (Signed)
Not improved with wax removal.  H/o noise exposure.  Likely from hearing loss, not wax.  D/w pt.  Declined referral for hearing aids now.  Call back prn.

## 2013-06-12 NOTE — Assessment & Plan Note (Signed)
He'll monitor BP and notify me.  No changes in meds today.

## 2013-06-28 ENCOUNTER — Other Ambulatory Visit: Payer: Self-pay | Admitting: Family Medicine

## 2013-06-29 ENCOUNTER — Other Ambulatory Visit: Payer: Self-pay | Admitting: Family Medicine

## 2013-07-09 ENCOUNTER — Telehealth: Payer: Self-pay | Admitting: *Deleted

## 2013-07-09 NOTE — Telephone Encounter (Signed)
Patient was asked to report BP and P readings for 1 week:       Date     BP  Pulse Tues, 07/02/13 141/65  67 Wed, 07/03/13 179/60  71 Thurs, 07/04/13  159/72  76 Fri, 07/05/13  153/67  75 Sat, 07/06/13  145/61  70 Sun, 07/07/13  170/54  78 Mon, 07/08/13  127/59  90 Tues, 07/09/13  150/73  87  Please advise.

## 2013-07-10 NOTE — Telephone Encounter (Signed)
These are quite variable -check for another week but only when totally relaxed/ sitting for 20 minutes or more and not right after exercise  If still elevated after a week we may need to add medication  Let him know Dr Damita Dunnings is out on paternity

## 2013-07-10 NOTE — Telephone Encounter (Signed)
Pt's wife advise of Dr. Marliss Coots comments/recommendations and verbalized understanding, she will call back in a week and update [place] on BP readings

## 2013-07-15 ENCOUNTER — Encounter (HOSPITAL_COMMUNITY): Payer: Self-pay | Admitting: Emergency Medicine

## 2013-07-15 ENCOUNTER — Encounter: Payer: Self-pay | Admitting: Family Medicine

## 2013-07-15 ENCOUNTER — Inpatient Hospital Stay (HOSPITAL_COMMUNITY)
Admission: EM | Admit: 2013-07-15 | Discharge: 2013-07-19 | DRG: 445 | Disposition: A | Discharge status: Home / Self Care | Payer: Medicare Other | Attending: Internal Medicine | Admitting: Internal Medicine

## 2013-07-15 ENCOUNTER — Inpatient Hospital Stay (HOSPITAL_COMMUNITY): Payer: Medicare Other

## 2013-07-15 ENCOUNTER — Emergency Department (HOSPITAL_COMMUNITY): Payer: Medicare Other

## 2013-07-15 ENCOUNTER — Ambulatory Visit (INDEPENDENT_AMBULATORY_CARE_PROVIDER_SITE_OTHER): Payer: Medicare Other | Admitting: Family Medicine

## 2013-07-15 VITALS — BP 102/64 | HR 84 | Temp 99.8°F | Wt 192.0 lb

## 2013-07-15 DIAGNOSIS — H9190 Unspecified hearing loss, unspecified ear: Secondary | ICD-10-CM

## 2013-07-15 DIAGNOSIS — R197 Diarrhea, unspecified: Secondary | ICD-10-CM

## 2013-07-15 DIAGNOSIS — J988 Other specified respiratory disorders: Secondary | ICD-10-CM | POA: Diagnosis present

## 2013-07-15 DIAGNOSIS — I739 Peripheral vascular disease, unspecified: Secondary | ICD-10-CM

## 2013-07-15 DIAGNOSIS — N400 Enlarged prostate without lower urinary tract symptoms: Secondary | ICD-10-CM | POA: Diagnosis present

## 2013-07-15 DIAGNOSIS — R21 Rash and other nonspecific skin eruption: Secondary | ICD-10-CM

## 2013-07-15 DIAGNOSIS — I714 Abdominal aortic aneurysm, without rupture, unspecified: Secondary | ICD-10-CM | POA: Diagnosis present

## 2013-07-15 DIAGNOSIS — I10 Essential (primary) hypertension: Secondary | ICD-10-CM | POA: Diagnosis present

## 2013-07-15 DIAGNOSIS — E041 Nontoxic single thyroid nodule: Secondary | ICD-10-CM

## 2013-07-15 DIAGNOSIS — K8042 Calculus of bile duct with acute cholecystitis without obstruction: Secondary | ICD-10-CM

## 2013-07-15 DIAGNOSIS — I959 Hypotension, unspecified: Secondary | ICD-10-CM | POA: Diagnosis not present

## 2013-07-15 DIAGNOSIS — D72829 Elevated white blood cell count, unspecified: Secondary | ICD-10-CM | POA: Diagnosis present

## 2013-07-15 DIAGNOSIS — R05 Cough: Secondary | ICD-10-CM

## 2013-07-15 DIAGNOSIS — R059 Cough, unspecified: Secondary | ICD-10-CM

## 2013-07-15 DIAGNOSIS — R5383 Other fatigue: Secondary | ICD-10-CM

## 2013-07-15 DIAGNOSIS — Z86010 Personal history of colonic polyps: Secondary | ICD-10-CM

## 2013-07-15 DIAGNOSIS — I70219 Atherosclerosis of native arteries of extremities with intermittent claudication, unspecified extremity: Secondary | ICD-10-CM | POA: Diagnosis present

## 2013-07-15 DIAGNOSIS — M549 Dorsalgia, unspecified: Secondary | ICD-10-CM

## 2013-07-15 DIAGNOSIS — Z79899 Other long term (current) drug therapy: Secondary | ICD-10-CM

## 2013-07-15 DIAGNOSIS — K819 Cholecystitis, unspecified: Secondary | ICD-10-CM

## 2013-07-15 DIAGNOSIS — E785 Hyperlipidemia, unspecified: Secondary | ICD-10-CM

## 2013-07-15 DIAGNOSIS — Y9210 Unspecified residential institution as the place of occurrence of the external cause: Secondary | ICD-10-CM | POA: Diagnosis not present

## 2013-07-15 DIAGNOSIS — T8140XA Infection following a procedure, unspecified, initial encounter: Secondary | ICD-10-CM | POA: Diagnosis not present

## 2013-07-15 DIAGNOSIS — K8000 Calculus of gallbladder with acute cholecystitis without obstruction: Secondary | ICD-10-CM

## 2013-07-15 DIAGNOSIS — I868 Varicose veins of other specified sites: Secondary | ICD-10-CM

## 2013-07-15 DIAGNOSIS — Y844 Aspiration of fluid as the cause of abnormal reaction of the patient, or of later complication, without mention of misadventure at the time of the procedure: Secondary | ICD-10-CM | POA: Diagnosis not present

## 2013-07-15 DIAGNOSIS — Z7902 Long term (current) use of antithrombotics/antiplatelets: Secondary | ICD-10-CM

## 2013-07-15 DIAGNOSIS — R1110 Vomiting, unspecified: Secondary | ICD-10-CM

## 2013-07-15 DIAGNOSIS — Z87891 Personal history of nicotine dependence: Secondary | ICD-10-CM

## 2013-07-15 DIAGNOSIS — Z7982 Long term (current) use of aspirin: Secondary | ICD-10-CM

## 2013-07-15 DIAGNOSIS — R109 Unspecified abdominal pain: Secondary | ICD-10-CM

## 2013-07-15 DIAGNOSIS — K5730 Diverticulosis of large intestine without perforation or abscess without bleeding: Secondary | ICD-10-CM

## 2013-07-15 DIAGNOSIS — R0989 Other specified symptoms and signs involving the circulatory and respiratory systems: Secondary | ICD-10-CM

## 2013-07-15 DIAGNOSIS — K219 Gastro-esophageal reflux disease without esophagitis: Secondary | ICD-10-CM | POA: Diagnosis present

## 2013-07-15 HISTORY — DX: Pneumonia, unspecified organism: J18.9

## 2013-07-15 HISTORY — DX: Shortness of breath: R06.02

## 2013-07-15 HISTORY — DX: Emphysema, unspecified: J43.9

## 2013-07-15 LAB — CBC WITH DIFFERENTIAL/PLATELET
Basophils Absolute: 0 10*3/uL (ref 0.0–0.1)
Basophils Relative: 0 % (ref 0–1)
Eosinophils Absolute: 0 10*3/uL (ref 0.0–0.7)
Eosinophils Relative: 0 % (ref 0–5)
HCT: 43 % (ref 39.0–52.0)
Hemoglobin: 14.6 g/dL (ref 13.0–17.0)
Lymphocytes Relative: 4 % — ABNORMAL LOW (ref 12–46)
Lymphs Abs: 0.8 10*3/uL (ref 0.7–4.0)
MCH: 30.3 pg (ref 26.0–34.0)
MCHC: 34 g/dL (ref 30.0–36.0)
MCV: 89.2 fL (ref 78.0–100.0)
Monocytes Absolute: 2.1 10*3/uL — ABNORMAL HIGH (ref 0.1–1.0)
Monocytes Relative: 12 % (ref 3–12)
Neutro Abs: 15.7 10*3/uL — ABNORMAL HIGH (ref 1.7–7.7)
Neutrophils Relative %: 84 % — ABNORMAL HIGH (ref 43–77)
Platelets: 140 10*3/uL — ABNORMAL LOW (ref 150–400)
RBC: 4.82 MIL/uL (ref 4.22–5.81)
RDW: 14.4 % (ref 11.5–15.5)
WBC: 18.6 10*3/uL — ABNORMAL HIGH (ref 4.0–10.5)

## 2013-07-15 LAB — URINALYSIS, ROUTINE W REFLEX MICROSCOPIC
Glucose, UA: NEGATIVE mg/dL
Hgb urine dipstick: NEGATIVE
Ketones, ur: 15 mg/dL — AB
Nitrite: NEGATIVE
Protein, ur: 30 mg/dL — AB
Specific Gravity, Urine: 1.028 (ref 1.005–1.030)
Urobilinogen, UA: 2 mg/dL — ABNORMAL HIGH (ref 0.0–1.0)
pH: 5 (ref 5.0–8.0)

## 2013-07-15 LAB — URINE MICROSCOPIC-ADD ON

## 2013-07-15 LAB — COMPREHENSIVE METABOLIC PANEL
ALT: 47 U/L (ref 0–53)
AST: 20 U/L (ref 0–37)
Albumin: 3.3 g/dL — ABNORMAL LOW (ref 3.5–5.2)
Alkaline Phosphatase: 68 U/L (ref 39–117)
BUN: 24 mg/dL — ABNORMAL HIGH (ref 6–23)
CO2: 28 mEq/L (ref 19–32)
Calcium: 9 mg/dL (ref 8.4–10.5)
Chloride: 99 mEq/L (ref 96–112)
Creatinine, Ser: 1.44 mg/dL — ABNORMAL HIGH (ref 0.50–1.35)
GFR calc Af Amer: 54 mL/min — ABNORMAL LOW (ref >90)
GFR calc non Af Amer: 47 mL/min — ABNORMAL LOW (ref >90)
Glucose, Bld: 118 mg/dL — ABNORMAL HIGH (ref 70–99)
Potassium: 3.9 mEq/L (ref 3.7–5.3)
Sodium: 141 mEq/L (ref 137–147)
Total Bilirubin: 2.4 mg/dL — ABNORMAL HIGH (ref 0.3–1.2)
Total Protein: 6.9 g/dL (ref 6.0–8.3)

## 2013-07-15 LAB — LIPASE, BLOOD: Lipase: 14 U/L (ref 11–59)

## 2013-07-15 MED ORDER — PIPERACILLIN-TAZOBACTAM 3.375 G IVPB
3.3750 g | Freq: Three times a day (TID) | INTRAVENOUS | Status: DC
  Administered 2013-07-16 – 2013-07-19 (×10): 3.375 g via INTRAVENOUS
  Filled 2013-07-15 (×12): qty 50

## 2013-07-15 MED ORDER — SODIUM CHLORIDE 0.9 % IV BOLUS (SEPSIS)
1000.0000 mL | Freq: Once | INTRAVENOUS | Status: AC
  Administered 2013-07-15: 1000 mL via INTRAVENOUS

## 2013-07-15 MED ORDER — HEPARIN SODIUM (PORCINE) 5000 UNIT/ML IJ SOLN
5000.0000 [IU] | Freq: Three times a day (TID) | INTRAMUSCULAR | Status: DC
  Administered 2013-07-16 – 2013-07-19 (×8): 5000 [IU] via SUBCUTANEOUS
  Filled 2013-07-15 (×15): qty 1

## 2013-07-15 MED ORDER — ACETAMINOPHEN 650 MG RE SUPP: 650.0000 mg | Freq: Four times a day (QID) | RECTAL | Status: DC | PRN

## 2013-07-15 MED ORDER — PIPERACILLIN-TAZOBACTAM 3.375 G IVPB 30 MIN
3.3750 g | Freq: Once | INTRAVENOUS | Status: AC
  Administered 2013-07-15: 3.375 g via INTRAVENOUS
  Filled 2013-07-15: qty 50

## 2013-07-15 MED ORDER — MORPHINE SULFATE 4 MG/ML IJ SOLN: 4.0000 mg | INTRAMUSCULAR | Status: DC | PRN

## 2013-07-15 MED ORDER — ONDANSETRON HCL 4 MG PO TABS: 4.0000 mg | ORAL_TABLET | Freq: Four times a day (QID) | ORAL | Status: DC | PRN

## 2013-07-15 MED ORDER — ACETAMINOPHEN 325 MG PO TABS: 650.0000 mg | ORAL_TABLET | Freq: Four times a day (QID) | ORAL | Status: DC | PRN

## 2013-07-15 MED ORDER — DILTIAZEM HCL ER 120 MG PO CP24
120.0000 mg | ORAL_CAPSULE | Freq: Two times a day (BID) | ORAL | Status: DC
  Administered 2013-07-16 – 2013-07-19 (×7): 120 mg via ORAL
  Filled 2013-07-15 (×11): qty 1

## 2013-07-15 MED ORDER — DEXTROSE-NACL 5-0.45 % IV SOLN
INTRAVENOUS | Status: DC
  Administered 2013-07-15: via INTRAVENOUS

## 2013-07-15 MED ORDER — ONDANSETRON HCL 4 MG/2ML IJ SOLN: 4.0000 mg | INTRAMUSCULAR | Status: DC | PRN

## 2013-07-15 MED ORDER — TAMSULOSIN HCL 0.4 MG PO CAPS
0.4000 mg | ORAL_CAPSULE | Freq: Every day | ORAL | Status: DC
  Administered 2013-07-16 – 2013-07-18 (×3): 0.4 mg via ORAL
  Filled 2013-07-15 (×4): qty 1

## 2013-07-15 MED ORDER — FINASTERIDE 5 MG PO TABS
5.0000 mg | ORAL_TABLET | Freq: Every evening | ORAL | Status: DC
  Administered 2013-07-16 – 2013-07-18 (×3): 5 mg via ORAL
  Filled 2013-07-15 (×4): qty 1

## 2013-07-15 MED ORDER — PANTOPRAZOLE SODIUM 40 MG IV SOLR
40.0000 mg | Freq: Every day | INTRAVENOUS | Status: DC
  Administered 2013-07-16 – 2013-07-18 (×3): 40 mg via INTRAVENOUS
  Filled 2013-07-15 (×5): qty 40

## 2013-07-15 MED ORDER — OXYCODONE HCL 5 MG PO TABS: 5.0000 mg | ORAL_TABLET | ORAL | Status: DC | PRN

## 2013-07-15 MED ORDER — ONDANSETRON HCL 4 MG/2ML IJ SOLN: 4.0000 mg | Freq: Four times a day (QID) | INTRAMUSCULAR | Status: DC | PRN

## 2013-07-15 MED ORDER — ZOLPIDEM TARTRATE 5 MG PO TABS: 5.0000 mg | ORAL_TABLET | Freq: Every evening | ORAL | Status: DC | PRN

## 2013-07-15 NOTE — H&P (Signed)
[facility]                                                                                    Patient Demographics  [name], is a 73 y.o. male  CSN: [id number]  MRN: [id number]  DOB - February 28, 1941  Admit Date - 07/15/2013  Outpatient Primary MD for the patient is [name], MD   With History of -  Past Medical History  Diagnosis Date  . Hyperlipidemia   . Hypertension   . OAD (obstructive airway disease)   . GERD (gastroesophageal reflux disease)   . Diverticulosis   . DDD (degenerative disc disease)     cervical, followed by [facility] 2012  . ED (erectile dysfunction)     no relief with cialis, etc  . Back pain     per [facility]  . Thyroid nodule   . Anemia   . Irregular heartbeat   . Peripheral vascular disease   . Bronchitis   . AAA (abdominal aortic aneurysm)       Past Surgical History  Procedure Laterality Date  . Small intestine surgery    . Skin graft    . Tonsillectomy    . Polypectomy    . Colonoscopy    . Aortogram  Aug. 1, 2013    in for   Chief Complaint  Patient presents with  . Abdominal Pain  . Diarrhea     HPI  [name]  is a 73 y.o. male, with past medical history significant for hypertension , hyperlipidemia, and peripheral vascular disease status post stent placement on Plavix , presenting with a severe history of the right upper quadrant pain with nausea vomiting and diarrhea, no reports of fever but no chills. There were reports of shortness of breath, no chest pains, no cough. The ultrasound in the emergency room showed cholecystitis and surgery was consulted and they will follow the patient with [place].    Review of Systems    In addition to the HPI above,   No Headache, No changes with Vision or hearing, No problems swallowing food or Liquids, No Chest pain, Cough or Shortness of Breath, Right upper quadrant Abdominal pain, with Nausea or Vommitting, and diarrhea No Blood in stool or Urine, No dysuria, No  new skin rashes or bruises, No new joints pains-aches,  No new weakness, tingling, numbness in any extremity, No recent weight gain or loss, No polyuria, polydypsia or polyphagia, No significant Mental Stressors.  A full 10 point Review of Systems was done, except as stated above, all other Review of Systems were negative.   Social History History  Substance Use Topics  . Smoking status: Former Smoker    Types: Cigarettes    Quit date: 02/28/1982  . Smokeless tobacco: Never Used     Comment: Quit in 40's   . Alcohol Use: 7.2 oz/week    12 Cans of beer per week     Comment: two 6 pack a week of beer     Family History Family History  Problem Relation Age of Onset  . Heart attack Mother   . Heart disease Mother   . Hyperlipidemia Mother   .  Stroke Father   . Heart attack Father   . Heart disease Father   . Hyperlipidemia Father   . Hypertension Father   . Other Father     varicose veins  . Heart attack Brother   . Prostate cancer Brother   . Cancer Brother   . Other Brother     varicose veins  . Heart attack Brother   . Heart disease Brother   . Hyperlipidemia Brother   . Other Brother     varicose veins  . Breast cancer Sister   . Diabetes Sister   . Cancer Sister   . Heart disease Brother   . Hyperlipidemia Brother   . Hypertension Brother   . Heart attack Brother      Prior to Admission medications   Medication Sig Start Date End Date Taking? Authorizing Provider  albuterol (PROVENTIL HFA;VENTOLIN HFA) 108 (90 BASE) MCG/ACT inhaler Inhale 1-2 puffs into the lungs every 6 (six) hours as needed for wheezing. 09/24/12  Yes Tonia Ghent, MD  clopidogrel (PLAVIX) 75 MG tablet Take 75 mg by mouth every evening.   Yes Historical Provider, MD  diltiazem (CARDIZEM) 120 MG tablet Take 120 mg by mouth 2 (two) times daily.   Yes Historical Provider, MD  finasteride (PROSCAR) 5 MG tablet Take 5 mg by mouth every evening.  05/21/12  Yes Historical Provider, MD   omeprazole (PRILOSEC) 20 MG capsule Take 20 mg by mouth every morning.   Yes Historical Provider, MD  pravastatin (PRAVACHOL) 40 MG tablet Take 20 mg by mouth every evening. Takes half tab daily   Yes Historical Provider, MD  tamsulosin (FLOMAX) 0.4 MG CAPS capsule Take 0.4 mg by mouth daily after supper.   Yes Historical Provider, MD    Allergies  Allergen Reactions  . Hydrochlorothiazide Other (See Comments)    REACTION: joint pain  . Benazepril Hcl Other (See Comments)    unknown    Physical Exam  Vitals  Blood pressure 130/82, pulse 104, temperature 101.7 F (38.7 C), temperature source Rectal, resp. rate 32, SpO2 92.00%.   1. General elderly white male very pleasant in moderate pain  2. Normal affect and insight, Not Suicidal or Homicidal, Awake Alert, Oriented X 3.  3. No F.N deficits, ALL C.Nerves Intact, Strength 5/5 all 4 extremities, Sensation intact all 4 extremities, Plantars down going.  4. Ears and Eyes appear Normal, Conjunctivae clear, PERRLA. Moist Oral Mucosa.  5. Supple Neck, No JVD, No cervical lymphadenopathy appriciated, No Carotid Bruits.  6. Symmetrical Chest wall movement, Good air movement bilaterally, CTAB.  7. RRR, No Gallops, Rubs or Murmurs, No Parasternal Heave.  8. Positive Bowel Sounds, Abdomen Soft, positive Murphy's sign with generalized abdominal tenderness   9.  No Cyanosis, Normal Skin Turgor, No Skin Rash or Bruise.  10. Good muscle tone,  joints appear normal , no effusions, Normal ROM.  11. No Palpable Lymph Nodes in Neck or Axillae    Data Review  CBC  Recent Labs Lab 07/15/13 1719  WBC 18.6*  HGB 14.6  HCT 43.0  PLT 140*  MCV 89.2  MCH 30.3  MCHC 34.0  RDW 14.4  LYMPHSABS 0.8  MONOABS 2.1*  EOSABS 0.0  BASOSABS 0.0   ------------------------------------------------------------------------------------------------------------------  Chemistries   Recent Labs Lab 07/15/13 1719  NA 141  K 3.9  CL 99   CO2 28  GLUCOSE 118*  BUN 24*  CREATININE 1.44*  CALCIUM 9.0  AST 20  ALT 47  ALKPHOS 68  BILITOT 2.4*     ---------------------------------------------------------------------------------------------------------------  Urinalysis    Component Value Date/Time   COLORURINE AMBER* 07/15/2013 2026   APPEARANCEUR CLEAR 07/15/2013 2026   LABSPEC 1.028 07/15/2013 2026   PHURINE 5.0 07/15/2013 2026   GLUCOSEU NEGATIVE 07/15/2013 2026   HGBUR NEGATIVE 07/15/2013 2026   BILIRUBINUR MODERATE* 07/15/2013 2026   KETONESUR 15* 07/15/2013 2026   PROTEINUR 30* 07/15/2013 2026   UROBILINOGEN 2.0* 07/15/2013 2026   NITRITE NEGATIVE 07/15/2013 2026   LEUKOCYTESUR SMALL* 07/15/2013 2026    ----------------------------------------------------------------------------------------------------------------    Imaging results:   US Abdomen Limited  07/15/2013   CLINICAL DATA:  73 year old with a right upper quadrant abdominal pain.  EXAM: US ABDOMEN LIMITED - RIGHT UPPER QUADRANT  COMPARISON:  CT abdomen and pelvis 02/05/2012, 03/08/2010, 01/09/2010.  FINDINGS: Gallbladder  Echogenic sludge and numerous small shadowing gallstones. Mild gallbladder wall thickening up to 5 mm. No visible pericholecystic fluid. Negative sonographic Murphy sign according to the ultrasound technologist.  Common bile duct  Diameter: 3-6 mm.  No visible bile duct stones.  Liver:  Diffusely increased and coarsened echotexture without focal hepatic parenchymal abnormality. Patent portal vein with hepatopetal flow.  IMPRESSION: 1. Cholelithiasis and gallbladder sludge. Mild gallbladder wall thickening is consistent with cholecystitis which may be acute or chronic. 2. No biliary ductal dilation. 3. Diffuse hepatic steatosis and/or hepatocellular disease. No focal hepatic parenchymal abnormality.   Electronically Signed   By: Evangeline Dakin M.D.   On: 07/15/2013 18:43    My personal review of EKG: Sinus tachycardia with incomplete right  bundle branch block   Assessment & Plan  1. Cholecystitis 2. Peripheral vascular disease status post common femoral stent placement 3. Hypertension, hyperlipidemia  Plan  IV antibiotics/Zosyn Consult surgery Hold Plavix (patient has been on Plavix x2 days) Prophylactic heparin Pain control   DVT Prophylaxis Heparin   AM Labs Ordered, also please review Full Orders  Family Communication: Admission, patients condition and plan of care including tests being ordered have been discussed with the patient and wife who indicate understanding and agree with the plan and Code Status.  Code Status full  Disposition Plan: Home  Time spent in minutes : 34 minutes  Condition GUARDED

## 2013-07-15 NOTE — Progress Notes (Signed)
ANTIBIOTIC CONSULT NOTE - INITIAL  Pharmacy Consult for zosyn Indication: cholecystitis  Allergies  Allergen Reactions  . Hydrochlorothiazide Other (See Comments)    REACTION: joint pain  . Benazepril Hcl Other (See Comments)    unknown    Patient Measurements:   Adjusted Body Weight:   Vital Signs: Temp: 101.7 F (38.7 C) (01/12 1728) Temp src: Rectal (01/12 1728) BP: 130/82 mmHg (01/12 2100) Pulse Rate: 104 (01/12 2100) Intake/Output from previous day:   Intake/Output from this shift:    Labs:  Recent Labs  07/15/13 1719  WBC 18.6*  HGB 14.6  PLT 140*  CREATININE 1.44*   The CrCl is unknown because both a height and weight (above a minimum accepted value) are required for this calculation. No results found for this basename: VANCOTROUGH, VANCOPEAK, VANCORANDOM, GENTTROUGH, GENTPEAK, GENTRANDOM, TOBRATROUGH, TOBRAPEAK, TOBRARND, AMIKACINPEAK, AMIKACINTROU, AMIKACIN,  in the last 72 hours   Microbiology: No results found for this or any previous visit (from the past 720 hour(s)).  Medical History: Past Medical History  Diagnosis Date  . Hyperlipidemia   . Hypertension   . OAD (obstructive airway disease)   . GERD (gastroesophageal reflux disease)   . Diverticulosis   . DDD (degenerative disc disease)     cervical, followed by [facility] 2012  . ED (erectile dysfunction)     no relief with cialis, etc  . Back pain     per [facility]  . Thyroid nodule   . Anemia   . Irregular heartbeat   . Peripheral vascular disease   . Bronchitis   . AAA (abdominal aortic aneurysm)     Medications:  Anti-infectives   Start     Dose/Rate Route Frequency Ordered Stop   07/16/13 0100  piperacillin-tazobactam (ZOSYN) IVPB 3.375 g     3.375 g 12.5 mL/hr over 240 Minutes Intravenous Every 8 hours 07/15/13 2118     07/15/13 1900  piperacillin-tazobactam (ZOSYN) IVPB 3.375 g     3.375 g 100 mL/hr over 30 Minutes Intravenous  Once 07/15/13 1846 07/15/13 1932     Assessment: 30  yom presented to the ED with abdominal pain. To start empiric zosyn. Tmax is 101.7, WBC is 18.6 and Scr is elevated at 1.44. He has received 1 dose so far in the ED.   Goal of Therapy:  Eradication of infection  Plan:  1. Zosyn 3.375gm IV Q8H (4 hr inf) 2. F/u renal fxn, C&S, clinical status  Veneda Kirksey, Rande Lawman 07/15/2013,9:18 PM

## 2013-07-15 NOTE — ED Notes (Signed)
MD at bedside. 

## 2013-07-15 NOTE — ED Notes (Signed)
Per EMS- pt has had abdominal pain and diarrhea for a week pt was seen at his PCP today and was sent from there. Last meal was Saturday. Pt has confusion that is new as well. Pt has new RUQ mass.

## 2013-07-15 NOTE — H&P (Signed)
[name] is an 72 y.o. male.   Chief Complaint: abdominal pain HPI: 72 yom with multiple medical problems who presents with 6 days of upper abdominal pain. This has been daily.  It felt better Friday so he began eating again then recurred.  He had low grade fever to just below 100.  He has been having diarrhea.  Had a couple episodes emesis.  No prior history.  Underwent u/s here that shows sludge and has mildly elevated tbili. He is breathing a little more labored than normal with some wheezing on my evaluation.  He also cannot walk very far baseline due to his leg pain and shortness of breath. He last took his plavix two days ago.   Past Medical History  Diagnosis Date  . Hyperlipidemia   . Hypertension   . OAD (obstructive airway disease)   . GERD (gastroesophageal reflux disease)   . Diverticulosis   . DDD (degenerative disc disease)     cervical, followed by [facility] 2012  . ED (erectile dysfunction)     no relief with cialis, etc  . Back pain     per [facility]  . Thyroid nodule   . Anemia   . Irregular heartbeat   . Peripheral vascular disease   . Bronchitis   . AAA (abdominal aortic aneurysm)     Past Surgical History  Procedure Laterality Date  . Small intestine surgery    . Skin graft    . Tonsillectomy    . Polypectomy    . Colonoscopy    . Aortogram  Aug. 1, 2013  iliac stents for pvd by his description  Family History  Problem Relation Age of Onset  . Heart attack Mother   . Heart disease Mother   . Hyperlipidemia Mother   . Stroke Father   . Heart attack Father   . Heart disease Father   . Hyperlipidemia Father   . Hypertension Father   . Other Father     varicose veins  . Heart attack Brother   . Prostate cancer Brother   . Cancer Brother   . Other Brother     varicose veins  . Heart attack Brother   . Heart disease Brother   . Hyperlipidemia Brother   . Other Brother     varicose veins  . Breast cancer Sister   . Diabetes Sister   . Cancer  Sister   . Heart disease Brother   . Hyperlipidemia Brother   . Hypertension Brother   . Heart attack Brother    Social History:  reports that he quit smoking about 31 years ago. His smoking use included Cigarettes. He smoked 0.00 packs per day. He has never used smokeless tobacco. He reports that he drinks about 7.2 ounces of alcohol per week. He reports that he does not use illicit drugs.  Allergies:  Allergies  Allergen Reactions  . Hydrochlorothiazide Other (See Comments)    REACTION: joint pain  . Benazepril Hcl Other (See Comments)    unknown    Meds reviewed   Results for orders placed during the hospital encounter of 07/15/13 (from the past 48 hour(s))  COMPREHENSIVE METABOLIC PANEL     Status: Abnormal   Collection Time    07/15/13  5:19 PM      Result Value Range   Sodium 141  137 - 147 mEq/L   Potassium 3.9  3.7 - 5.3 mEq/L   Chloride 99  96 - 112 mEq/L   CO2   28  19 - 32 mEq/L   Glucose, Bld 118 (*) 70 - 99 mg/dL   BUN 24 (*) 6 - 23 mg/dL   Creatinine, Ser 1.44 (*) 0.50 - 1.35 mg/dL   Calcium 9.0  8.4 - 10.5 mg/dL   Total Protein 6.9  6.0 - 8.3 g/dL   Albumin 3.3 (*) 3.5 - 5.2 g/dL   AST 20  0 - 37 U/L   ALT 47  0 - 53 U/L   Alkaline Phosphatase 68  39 - 117 U/L   Total Bilirubin 2.4 (*) 0.3 - 1.2 mg/dL   GFR calc non Af Amer 47 (*) >90 mL/min   GFR calc Af Amer 54 (*) >90 mL/min   Comment: (NOTE)     The eGFR has been calculated using the CKD EPI equation.     This calculation has not been validated in all clinical situations.     eGFR's persistently <90 mL/min signify possible Chronic Kidney     Disease.  LIPASE, BLOOD     Status: None   Collection Time    07/15/13  5:19 PM      Result Value Range   Lipase 14  11 - 59 U/L  CBC WITH DIFFERENTIAL     Status: Abnormal   Collection Time    07/15/13  5:19 PM      Result Value Range   WBC 18.6 (*) 4.0 - 10.5 K/uL   RBC 4.82  4.22 - 5.81 MIL/uL   Hemoglobin 14.6  13.0 - 17.0 g/dL   HCT 43.0  39.0 - 52.0  %   MCV 89.2  78.0 - 100.0 fL   MCH 30.3  26.0 - 34.0 pg   MCHC 34.0  30.0 - 36.0 g/dL   RDW 14.4  11.5 - 15.5 %   Platelets 140 (*) 150 - 400 K/uL   Neutrophils Relative % 84 (*) 43 - 77 %   Neutro Abs 15.7 (*) 1.7 - 7.7 K/uL   Lymphocytes Relative 4 (*) 12 - 46 %   Lymphs Abs 0.8  0.7 - 4.0 K/uL   Monocytes Relative 12  3 - 12 %   Monocytes Absolute 2.1 (*) 0.1 - 1.0 K/uL   Eosinophils Relative 0  0 - 5 %   Eosinophils Absolute 0.0  0.0 - 0.7 K/uL   Basophils Relative 0  0 - 1 %   Basophils Absolute 0.0  0.0 - 0.1 K/uL   US Abdomen Limited  07/15/2013   CLINICAL DATA:  73 year old with a right upper quadrant abdominal pain.  EXAM: US ABDOMEN LIMITED - RIGHT UPPER QUADRANT  COMPARISON:  CT abdomen and pelvis 02/05/2012, 03/08/2010, 01/09/2010.  FINDINGS: Gallbladder  Echogenic sludge and numerous small shadowing gallstones. Mild gallbladder wall thickening up to 5 mm. No visible pericholecystic fluid. Negative sonographic Murphy sign according to the ultrasound technologist.  Common bile duct  Diameter: 3-6 mm.  No visible bile duct stones.  Liver:  Diffusely increased and coarsened echotexture without focal hepatic parenchymal abnormality. Patent portal vein with hepatopetal flow.  IMPRESSION: 1. Cholelithiasis and gallbladder sludge. Mild gallbladder wall thickening is consistent with cholecystitis which may be acute or chronic. 2. No biliary ductal dilation. 3. Diffuse hepatic steatosis and/or hepatocellular disease. No focal hepatic parenchymal abnormality.   Electronically Signed   By: Evangeline Dakin M.D.   On: 07/15/2013 18:43    Review of Systems  Constitutional: Positive for fever and malaise/fatigue. Negative for chills.  Respiratory: Positive for shortness of  breath and wheezing. Negative for cough and sputum production.   Cardiovascular: Positive for claudication. Negative for chest pain and leg swelling.  Gastrointestinal: Positive for nausea, vomiting, abdominal pain and  diarrhea.    Blood pressure 135/62, pulse 103, temperature 101.7 F (38.7 C), temperature source Rectal, resp. rate 29, SpO2 92.00%. Physical Exam  Vitals reviewed. Constitutional: He appears well-developed and well-nourished.  Eyes: No scleral icterus.  Neck: Neck supple.  Cardiovascular: Regular rhythm.  Tachycardia present.   Pulses:      Femoral pulses are 1+ on the right side, and 1+ on the left side. Respiratory: No respiratory distress. He has wheezes.  GI: Bowel sounds are normal. He exhibits no distension. There is tenderness in the right upper quadrant. There is positive Murphy's sign. No hernia.  Lymphadenopathy:    He has no cervical adenopathy.     Assessment/Plan Acute cholecystitis  Clinically appears to have cholecystitis.  He took plavix yesterday.  He has long course of symptoms almost a week now.  Due to overall condition might be better to place drain as opposed to cholecystectomy unless improves quickly with antibiotics.  Will ask medical service to consult.  He needs cxr and ekg also which I don't see are done in the er.  Discussed admission, iv abx, rechecking labs in am as tbili is elevated now.  Discussed surgery vs possible percutaneous drain with he and his family.   Bonner Larue 07/15/2013, 8:21 PM

## 2013-07-15 NOTE — ED Notes (Signed)
Family states that pt is more 'drowsy" than normal

## 2013-07-15 NOTE — Assessment & Plan Note (Signed)
With RUQ abd pain on exam with guarding in h/o diverticulitis and h/o bowel obstruction, will need to r/o these as well as cholecystitis. He is also more confused than baseline - A&O x2.  Will need to r/o sepsis/uremia. Will need emergent eval at ER with imaging and blood work, as well as close monitoring with vital signs. I don't feel comfortable sending patient via private vehicle so have called 911 for ambulance transportation. Discussed with pt and wife - ambulance called.

## 2013-07-15 NOTE — Progress Notes (Signed)
Subjective:    Patient ID: [name], male    DOB: [birth year], 73 y.o.   MRN: [id number]  HPI CC: "I feel terrible"  Presents with wife today.   1 wk h/o feeling ill - malaise.   Tuesday night started vomiting with watery diarrhea.  NBNB emesis.  This went on for 2 days then vomiting stopped but diarrhea has persisted.  On and off diarrhea.  Also endorsing abd pain diffusely.  Last BM was Friday - diarrhea.  Only full meal was Saturday breakfast, vomited afterwards.  Urine very dark.  Wife feels he's more confused over last 24 hours - not making sense.  eg trying to comb his hair with razor today.  Unsteady on his feet. No fevers/chills.  Temperature today 99.8 - and it has run like this all week. No sick contacts at home.  Not drinking enough - not even a whole bottle a day No recent med changes. Denies chest pain, dyspnea, HA.  Lab Results  Component Value Date   CREATININE 1.0 09/24/2012   BP Readings from Last 3 Encounters:  07/15/13 102/64  06/11/13 170/90  03/22/13 144/86  bp low today for him.  H/o diverticulitis in past. "this feels like that" H/o SBO 02/2012 resolved with conservative treatment.  Medications and allergies reviewed and updated in chart.  Past histories reviewed and updated if relevant as below. Patient Active Problem List   Diagnosis Date Noted  . Hard of hearing 06/12/2013  . Abdominal aneurysm without mention of rupture 03/22/2013  . Rash and nonspecific skin eruption 09/24/2012  . Cough 06/10/2012  . Peripheral vascular disease, unspecified 06/08/2012  . Atherosclerotic PVD with intermittent claudication 03/02/2012  . Atherosclerosis of native arteries of the extremities with intermittent claudication 12/23/2011  . PVD (peripheral vascular disease) 11/16/2011  . Decreased pulses in feet 11/09/2011  . Vomiting and diarrhea 07/14/2011  . Back pain 06/22/2011  . Thyroid nodule 06/21/2011  . Fatigue 06/21/2011  . DIVERTICULOSIS, COLON  01/28/2010  . VARICOSE VEIN 10/01/2008  . UNSPECIFIED ESSENTIAL HYPERTENSION 04/03/2008  . GERD 04/03/2008  . COLONIC POLYPS, HX OF 04/03/2008  . HYPERLIPIDEMIA 12/14/2006   Past Medical History  Diagnosis Date  . Hyperlipidemia   . Hypertension   . OAD (obstructive airway disease)   . GERD (gastroesophageal reflux disease)   . Diverticulosis   . DDD (degenerative disc disease)     cervical, followed by [facility] 2012  . ED (erectile dysfunction)     no relief with cialis, etc  . Back pain     per [facility]  . Thyroid nodule   . Anemia   . Irregular heartbeat   . Peripheral vascular disease   . Bronchitis   . AAA (abdominal aortic aneurysm)    Past Surgical History  Procedure Laterality Date  . Small intestine surgery    . Skin graft    . Tonsillectomy    . Polypectomy    . Colonoscopy    . Aortogram  Aug. 1, 2013   History  Substance Use Topics  . Smoking status: Former Smoker    Types: Cigarettes    Quit date: 02/28/1982  . Smokeless tobacco: Never Used     Comment: Quit in 40's   . Alcohol Use: 7.2 oz/week    12 Cans of beer per week     Comment: two 6 pack a week of beer   Family History  Problem Relation Age of Onset  . Heart attack Mother   .  Heart disease Mother   . Hyperlipidemia Mother   . Stroke Father   . Heart attack Father   . Heart disease Father   . Hyperlipidemia Father   . Hypertension Father   . Other Father     varicose veins  . Heart attack Brother   . Prostate cancer Brother   . Cancer Brother   . Other Brother     varicose veins  . Heart attack Brother   . Heart disease Brother   . Hyperlipidemia Brother   . Other Brother     varicose veins  . Breast cancer Sister   . Diabetes Sister   . Cancer Sister   . Heart disease Brother   . Hyperlipidemia Brother   . Hypertension Brother   . Heart attack Brother    Allergies  Allergen Reactions  . Benazepril Hcl Other (See Comments)    unknown  . Hydrochlorothiazide     REACTION:  joint pain   Current Outpatient Prescriptions on File Prior to Visit  Medication Sig Dispense Refill  . albuterol (PROVENTIL HFA;VENTOLIN HFA) 108 (90 BASE) MCG/ACT inhaler Inhale 1-2 puffs into the lungs every 6 (six) hours as needed for wheezing.  1 Inhaler  5  . clopidogrel (PLAVIX) 75 MG tablet Take 1 tablet (75 mg total) by mouth daily.  30 tablet  11  . diltiazem (CARDIZEM) 120 MG tablet TAKE ONE TABLET BY MOUTH TWICE DAILY  180 tablet  0  . finasteride (PROSCAR) 5 MG tablet Take 5 mg by mouth daily.       Marland Kitchen omeprazole (PRILOSEC) 20 MG capsule TAKE ONE CAPSULE BY MOUTH ONCE DAILY  90 capsule  0  . pravastatin (PRAVACHOL) 40 MG tablet TAKE ONE-HALF TABLET BY MOUTH ONCE DAILY  45 tablet  0  . Tamsulosin HCl (FLOMAX) 0.4 MG CAPS Take 0.4 mg by mouth daily.        No current facility-administered medications on file prior to visit.     Review of Systems Per HPI    Objective:   Physical Exam  Nursing note and vitals reviewed. Constitutional: He appears well-developed and well-nourished. No distress.  HENT:  Dry mm  Eyes:  No icterus noted  Cardiovascular: Normal rate, regular rhythm, normal heart sounds and intact distal pulses.   No murmur heard. Pulmonary/Chest: Effort normal and breath sounds normal. No respiratory distress. He has no wheezes. He has no rales.  Abdominal: Soft. Normal appearance and bowel sounds are normal. He exhibits distension. He exhibits no mass. There is hepatomegaly. There is no splenomegaly. There is tenderness in the right upper quadrant. There is guarding and positive Murphy's sign. There is no rigidity and no rebound.  Musculoskeletal: He exhibits no edema.  Neurological:  A&O x2 - person, place. Time - January 2010, Friday.  Skin: Skin is warm and dry. No rash noted.  No jaundice noted       Assessment & Plan:

## 2013-07-15 NOTE — Progress Notes (Signed)
Pre-visit discussion using our clinic review tool. No additional management support is needed unless otherwise documented below in the visit note.  

## 2013-07-15 NOTE — ED Notes (Signed)
MD informed that pt cannot urinate at this time. MD states that we can wait and do not have to in and out cath at this time.

## 2013-07-16 ENCOUNTER — Inpatient Hospital Stay (HOSPITAL_COMMUNITY): Payer: Medicare Other

## 2013-07-16 ENCOUNTER — Encounter (HOSPITAL_COMMUNITY): Payer: Self-pay | Admitting: General Practice

## 2013-07-16 DIAGNOSIS — K8000 Calculus of gallbladder with acute cholecystitis without obstruction: Principal | ICD-10-CM

## 2013-07-16 LAB — CBC
HCT: 40.2 % (ref 39.0–52.0)
Hemoglobin: 13.7 g/dL (ref 13.0–17.0)
MCH: 29.7 pg (ref 26.0–34.0)
MCHC: 34.1 g/dL (ref 30.0–36.0)
MCV: 87.2 fL (ref 78.0–100.0)
Platelets: 124 10*3/uL — ABNORMAL LOW (ref 150–400)
RBC: 4.61 MIL/uL (ref 4.22–5.81)
RDW: 14.4 % (ref 11.5–15.5)
WBC: 15.6 10*3/uL — AB (ref 4.0–10.5)

## 2013-07-16 LAB — COMPREHENSIVE METABOLIC PANEL
ALT: 36 U/L (ref 0–53)
AST: 15 U/L (ref 0–37)
Albumin: 3 g/dL — ABNORMAL LOW (ref 3.5–5.2)
Alkaline Phosphatase: 75 U/L (ref 39–117)
BUN: 24 mg/dL — ABNORMAL HIGH (ref 6–23)
CO2: 26 meq/L (ref 19–32)
Calcium: 9 mg/dL (ref 8.4–10.5)
Chloride: 102 mEq/L (ref 96–112)
Creatinine, Ser: 1.21 mg/dL (ref 0.50–1.35)
GFR calc Af Amer: 67 mL/min — ABNORMAL LOW (ref >90)
GFR, EST NON AFRICAN AMERICAN: 58 mL/min — AB (ref >90)
Glucose, Bld: 113 mg/dL — ABNORMAL HIGH (ref 70–99)
Potassium: 3.6 mEq/L — ABNORMAL LOW (ref 3.7–5.3)
SODIUM: 142 meq/L (ref 137–147)
Total Bilirubin: 1.9 mg/dL — ABNORMAL HIGH (ref 0.3–1.2)
Total Protein: 6.7 g/dL (ref 6.0–8.3)

## 2013-07-16 LAB — LIPASE, BLOOD: LIPASE: 18 U/L (ref 11–59)

## 2013-07-16 LAB — PROTIME-INR
INR: 1.28 (ref 0.00–1.49)
Prothrombin Time: 15.7 seconds — ABNORMAL HIGH (ref 11.6–15.2)

## 2013-07-16 LAB — AMYLASE: Amylase: 18 U/L (ref 0–105)

## 2013-07-16 MED ORDER — BIOTENE DRY MOUTH MT LIQD: 15.0000 mL | Freq: Two times a day (BID) | OROMUCOSAL | Status: DC

## 2013-07-16 MED ORDER — KCL IN DEXTROSE-NACL 20-5-0.2 MEQ/L-%-% IV SOLN
INTRAVENOUS | Status: AC
  Administered 2013-07-16 – 2013-07-17 (×2): via INTRAVENOUS
  Filled 2013-07-16 (×3): qty 1000

## 2013-07-16 MED ORDER — TECHNETIUM TC 99M MEBROFENIN IV KIT
5.0000 | PACK | Freq: Once | INTRAVENOUS | Status: AC | PRN
  Administered 2013-07-16: 5 via INTRAVENOUS

## 2013-07-16 MED ORDER — MORPHINE SULFATE 4 MG/ML IJ SOLN
3.5000 mg | Freq: Once | INTRAMUSCULAR | Status: AC
  Administered 2013-07-16: 3.5 mg via INTRAVENOUS

## 2013-07-16 MED ORDER — ASPIRIN EC 325 MG PO TBEC
325.0000 mg | DELAYED_RELEASE_TABLET | Freq: Every day | ORAL | Status: DC
  Administered 2013-07-16 – 2013-07-19 (×4): 325 mg via ORAL
  Filled 2013-07-16 (×4): qty 1

## 2013-07-16 MED ORDER — CHLORHEXIDINE GLUCONATE 0.12 % MT SOLN
15.0000 mL | Freq: Two times a day (BID) | OROMUCOSAL | Status: DC
  Administered 2013-07-16 – 2013-07-17 (×2): 15 mL via OROMUCOSAL
  Filled 2013-07-16 (×3): qty 15

## 2013-07-16 MED ORDER — MORPHINE SULFATE 4 MG/ML IJ SOLN
INTRAMUSCULAR | Status: AC
  Administered 2013-07-16: 3.5 mg via INTRAVENOUS
  Filled 2013-07-16: qty 1

## 2013-07-16 NOTE — Progress Notes (Signed)
IR PA aware of request for image guided percutaneous cholecystostomy tube placement for suspected acute cholecystitis. [place] images reviewed by Dr. Vernard Gambles who recommends that the patient have a HIDA scan given the increased risk of procedure with recently being on plavix. Patient is on plavix for PVD stent, last dose 07/14/13.   Tsosie Billing PA-C Interventional Radiology  07/16/13  8:27 AM

## 2013-07-16 NOTE — Progress Notes (Signed)
Patient ID: [name], male   DOB: 05/28/1941, 73 y.o.   MRN: [id number]   HIDA scan does confirm cholecystitis  We will move forward with chole drain 1/14  Pt is stable per RN Continue to hold Plavix Hold Hep injs 1/14  Pt aware of procedure benefits and risks and agreeable to  Proceed Consent signed andin chart

## 2013-07-16 NOTE — Progress Notes (Signed)
Patient ID: [name], male   DOB: 1941-06-12, 73 y.o.   MRN: [id number]    Subjective: Pt says he feels ok today.  No more nausea  Objective: Vital signs in last 24 hours: Temp:  [97.4 F (36.3 C)-101.7 F (38.7 C)] 98.2 F (36.8 C) (01/13 0500) Pulse Rate:  [84-110] 100 (01/13 0500) Resp:  [20-36] 22 (01/13 0500) BP: (102-142)/(43-91) 142/72 mmHg (01/13 0500) SpO2:  [91 %-98 %] 91 % (01/13 0500) Weight:  [190 lb 14.4 oz (86.592 kg)-192 lb (87.091 kg)] 190 lb 14.4 oz (86.592 kg) (01/12 2309) Last BM Date: 07/13/13  Intake/Output from previous day: 01/12 0701 - 01/13 0700 In: -  Out: 350 [Urine:350] Intake/Output this shift: Total I/O In: -  Out: 125 [Urine:125]  PE: Abd: soft, quite tender in RUQ with voluntary guarding, few BS, some distention  Lab Results:   Recent Labs  07/15/13 1719 07/16/13 0525  WBC 18.6* 15.6*  HGB 14.6 13.7  HCT 43.0 40.2  PLT 140* 124*   BMET  Recent Labs  07/15/13 1719 07/16/13 0525  NA 141 142  K 3.9 3.6*  CL 99 102  CO2 28 26  GLUCOSE 118* 113*  BUN 24* 24*  CREATININE 1.44* 1.21  CALCIUM 9.0 9.0   PT/INR No results found for this basename: LABPROT, INR,  in the last 72 hours CMP     Component Value Date/Time   NA 142 07/16/2013 0525   K 3.6* 07/16/2013 0525   CL 102 07/16/2013 0525   CO2 26 07/16/2013 0525   GLUCOSE 113* 07/16/2013 0525   BUN 24* 07/16/2013 0525   CREATININE 1.21 07/16/2013 0525   CALCIUM 9.0 07/16/2013 0525   PROT 6.7 07/16/2013 0525   ALBUMIN 3.0* 07/16/2013 0525   AST 15 07/16/2013 0525   ALT 36 07/16/2013 0525   ALKPHOS 75 07/16/2013 0525   BILITOT 1.9* 07/16/2013 0525   GFRNONAA 58* 07/16/2013 0525   GFRAA 67* 07/16/2013 0525   Lipase     Component Value Date/Time   LIPASE 18 07/16/2013 0525       Studies/Results: Dg Chest 2 View  07/15/2013   CLINICAL DATA:  Fever, vomiting, abdominal pain.  EXAM: CHEST  2 VIEW  COMPARISON:  06/21/2012  FINDINGS: There is marked dilatation of the right  hemidiaphragm which is a new finding. There is associated atelectasis at the right base. Left lung is grossly clear. Normal heart size. No pneumothorax.  IMPRESSION: New elevation of the right hemidiaphragm. Differential diagnosis includes subpulmonic pleural effusion and splinting of the right hemidiaphragm secondary to an abdominal inflammatory process. There is associated atelectasis at the right lung base   Electronically Signed   By: Maryclare Bean M.D.   On: 07/15/2013 21:37   US Abdomen Limited  07/15/2013   CLINICAL DATA:  73 year old with a right upper quadrant abdominal pain.  EXAM: US ABDOMEN LIMITED - RIGHT UPPER QUADRANT  COMPARISON:  CT abdomen and pelvis 02/05/2012, 03/08/2010, 01/09/2010.  FINDINGS: Gallbladder  Echogenic sludge and numerous small shadowing gallstones. Mild gallbladder wall thickening up to 5 mm. No visible pericholecystic fluid. Negative sonographic Murphy sign according to the ultrasound technologist.  Common bile duct  Diameter: 3-6 mm.  No visible bile duct stones.  Liver:  Diffusely increased and coarsened echotexture without focal hepatic parenchymal abnormality. Patent portal vein with hepatopetal flow.  IMPRESSION: 1. Cholelithiasis and gallbladder sludge. Mild gallbladder wall thickening is consistent with cholecystitis which may be acute or chronic. 2. No biliary ductal  dilation. 3. Diffuse hepatic steatosis and/or hepatocellular disease. No focal hepatic parenchymal abnormality.   Electronically Signed   By: Evangeline Dakin M.D.   On: 07/15/2013 18:43    Anti-infectives: Anti-infectives   Start     Dose/Rate Route Frequency Ordered Stop   07/16/13 0100  piperacillin-tazobactam (ZOSYN) IVPB 3.375 g     3.375 g 12.5 mL/hr over 240 Minutes Intravenous Every 8 hours 07/15/13 2118     07/15/13 1900  piperacillin-tazobactam (ZOSYN) IVPB 3.375 g     3.375 g 100 mL/hr over 30 Minutes Intravenous  Once 07/15/13 1846 07/15/13 2000       Assessment/Plan  1. Acute  choelcystitis Patient Active Problem List   Diagnosis Date Noted  . Cholecystitis 07/15/2013  . Hard of hearing 06/12/2013  . Abdominal aneurysm without mention of rupture 03/22/2013  . Rash and nonspecific skin eruption 09/24/2012  . Cough 06/10/2012  . Peripheral vascular disease, unspecified 06/08/2012  . Atherosclerotic PVD with intermittent claudication 03/02/2012  . Atherosclerosis of native arteries of the extremities with intermittent claudication 12/23/2011  . PVD (peripheral vascular disease) 11/16/2011  . Decreased pulses in feet 11/09/2011  . Vomiting and diarrhea 07/14/2011  . Back pain 06/22/2011  . Thyroid nodule 06/21/2011  . Fatigue 06/21/2011  . DIVERTICULOSIS, COLON 01/28/2010  . VARICOSE VEIN 10/01/2008  . UNSPECIFIED ESSENTIAL HYPERTENSION 04/03/2008  . GERD 04/03/2008  . COLONIC POLYPS, HX OF 04/03/2008  . HYPERLIPIDEMIA 12/14/2006   Plan: 1. Given the patient's multiple co-morbidities and duration of pain, he would benefit from percutaneous cholecytostomy drain right now instead of immediate surgical intervention.  This drain should remain in place for 6-8 weeks, then consider elective resection. 2. Cont IV abx therapy for now.  3. Cont NPO 4. Will follow.   LOS: 1 day    Solina Heron E 07/16/2013, 9:50 AM Pager: [phone]

## 2013-07-16 NOTE — Progress Notes (Signed)
Acute cholecystitis on exam but poor operative candidate and has had symptoms more than 6 days.  Not medically fit for OR.  Needs perc drain for now.

## 2013-07-16 NOTE — H&P (Signed)
[name] is an 73 y.o. male.   Chief Complaint: pt comes to ER with 6-7 days of intermittent abd pain  mostly RUQ; +N/+V/+D US shows cholelithiasis and GB sludge consistent with acute cholecystitis Dr Brantley Stage feels pt is poor candidate for OR- On Plavix for PVD/stent- last dose 1/11 Request made for percutaneous cholecystostomy tube drain Pt now afeb; off Plavix 2 days; wbc 15.7 RUQ pain persists Last dose Hep inj this am at 645 am Will discuss with Radiologist----needs HIDA scan per Dr Vernard Gambles  HPI: HLD; HTN; GERD; diverticulosis; PVD- stent; AAA; COPD; DDD  Past Medical History  Diagnosis Date  . Hyperlipidemia   . Hypertension   . OAD (obstructive airway disease)   . GERD (gastroesophageal reflux disease)   . Diverticulosis   . ED (erectile dysfunction)     no relief with cialis, etc  . Back pain     per [facility]  . Thyroid nodule   . Anemia   . Irregular heartbeat   . Peripheral vascular disease   . Bronchitis   . AAA (abdominal aortic aneurysm)   . Emphysema     "has a little bit" (07/16/2013)  . Pneumonia     "twice since 1959; once when he was a child" (07/16/2013)  . Exertional shortness of breath   . DDD (degenerative disc disease)     cervical, followed by [facility] 2012    Past Surgical History  Procedure Laterality Date  . Small intestine surgery      "blockage" (07/16/2013)  . Skin graft Left     "hand"  . Tonsillectomy    . Colonoscopy w/ polypectomy    . Aortogram  Aug. 1, 2013  . Colectomy  1961  . Iliac artery stent Right 02/2012    "Dr. Bridgett Larsson"    Family History  Problem Relation Age of Onset  . Heart attack Mother   . Heart disease Mother   . Hyperlipidemia Mother   . Stroke Father   . Heart attack Father   . Heart disease Father   . Hyperlipidemia Father   . Hypertension Father   . Other Father     varicose veins  . Heart attack Brother   . Prostate cancer Brother   . Cancer Brother   . Other Brother     varicose veins  . Heart attack  Brother   . Heart disease Brother   . Hyperlipidemia Brother   . Other Brother     varicose veins  . Breast cancer Sister   . Diabetes Sister   . Cancer Sister   . Heart disease Brother   . Hyperlipidemia Brother   . Hypertension Brother   . Heart attack Brother    Social History:  reports that he quit smoking about 31 years ago. His smoking use included Cigarettes. He has a 90 pack-year smoking history. He has never used smokeless tobacco. He reports that he drinks alcohol. He reports that he does not use illicit drugs.  Allergies:  Allergies  Allergen Reactions  . Hydrochlorothiazide Other (See Comments)    REACTION: joint pain  . Benazepril Hcl Other (See Comments)    unknown    Medications Prior to Admission  Medication Sig Dispense Refill  . albuterol (PROVENTIL HFA;VENTOLIN HFA) 108 (90 BASE) MCG/ACT inhaler Inhale 1-2 puffs into the lungs every 6 (six) hours as needed for wheezing.  1 Inhaler  5  . clopidogrel (PLAVIX) 75 MG tablet Take 75 mg by mouth every evening.      Marland Kitchen  diltiazem (CARDIZEM) 120 MG tablet Take 120 mg by mouth 2 (two) times daily.      . finasteride (PROSCAR) 5 MG tablet Take 5 mg by mouth every evening.       Marland Kitchen omeprazole (PRILOSEC) 20 MG capsule Take 20 mg by mouth every morning.      . pravastatin (PRAVACHOL) 40 MG tablet Take 20 mg by mouth every evening. Takes half tab daily      . tamsulosin (FLOMAX) 0.4 MG CAPS capsule Take 0.4 mg by mouth daily after supper.        Results for orders placed during the hospital encounter of 07/15/13 (from the past 48 hour(s))  COMPREHENSIVE METABOLIC PANEL     Status: Abnormal   Collection Time    07/15/13  5:19 PM      Result Value Range   Sodium 141  137 - 147 mEq/L   Potassium 3.9  3.7 - 5.3 mEq/L   Chloride 99  96 - 112 mEq/L   CO2 28  19 - 32 mEq/L   Glucose, Bld 118 (*) 70 - 99 mg/dL   BUN 24 (*) 6 - 23 mg/dL   Creatinine, Ser 1.44 (*) 0.50 - 1.35 mg/dL   Calcium 9.0  8.4 - 10.5 mg/dL   Total  Protein 6.9  6.0 - 8.3 g/dL   Albumin 3.3 (*) 3.5 - 5.2 g/dL   AST 20  0 - 37 U/L   ALT 47  0 - 53 U/L   Alkaline Phosphatase 68  39 - 117 U/L   Total Bilirubin 2.4 (*) 0.3 - 1.2 mg/dL   GFR calc non Af Amer 47 (*) >90 mL/min   GFR calc Af Amer 54 (*) >90 mL/min   Comment: (NOTE)     The eGFR has been calculated using the CKD EPI equation.     This calculation has not been validated in all clinical situations.     eGFR's persistently <90 mL/min signify possible Chronic Kidney     Disease.  LIPASE, BLOOD     Status: None   Collection Time    07/15/13  5:19 PM      Result Value Range   Lipase 14  11 - 59 U/L  CBC WITH DIFFERENTIAL     Status: Abnormal   Collection Time    07/15/13  5:19 PM      Result Value Range   WBC 18.6 (*) 4.0 - 10.5 K/uL   RBC 4.82  4.22 - 5.81 MIL/uL   Hemoglobin 14.6  13.0 - 17.0 g/dL   HCT 43.0  39.0 - 52.0 %   MCV 89.2  78.0 - 100.0 fL   MCH 30.3  26.0 - 34.0 pg   MCHC 34.0  30.0 - 36.0 g/dL   RDW 14.4  11.5 - 15.5 %   Platelets 140 (*) 150 - 400 K/uL   Neutrophils Relative % 84 (*) 43 - 77 %   Neutro Abs 15.7 (*) 1.7 - 7.7 K/uL   Lymphocytes Relative 4 (*) 12 - 46 %   Lymphs Abs 0.8  0.7 - 4.0 K/uL   Monocytes Relative 12  3 - 12 %   Monocytes Absolute 2.1 (*) 0.1 - 1.0 K/uL   Eosinophils Relative 0  0 - 5 %   Eosinophils Absolute 0.0  0.0 - 0.7 K/uL   Basophils Relative 0  0 - 1 %   Basophils Absolute 0.0  0.0 - 0.1 K/uL  URINALYSIS, ROUTINE W REFLEX MICROSCOPIC  Status: Abnormal   Collection Time    07/15/13  8:26 PM      Result Value Range   Color, Urine AMBER (*) YELLOW   Comment: BIOCHEMICALS MAY BE AFFECTED BY COLOR   APPearance CLEAR  CLEAR   Specific Gravity, Urine 1.028  1.005 - 1.030   pH 5.0  5.0 - 8.0   Glucose, UA NEGATIVE  NEGATIVE mg/dL   Hgb urine dipstick NEGATIVE  NEGATIVE   Bilirubin Urine MODERATE (*) NEGATIVE   Ketones, ur 15 (*) NEGATIVE mg/dL   Protein, ur 30 (*) NEGATIVE mg/dL   Urobilinogen, UA 2.0 (*) 0.0 -  1.0 mg/dL   Nitrite NEGATIVE  NEGATIVE   Leukocytes, UA SMALL (*) NEGATIVE  URINE MICROSCOPIC-ADD ON     Status: Abnormal   Collection Time    07/15/13  8:26 PM      Result Value Range   Squamous Epithelial / LPF RARE  RARE   WBC, UA 0-2  <3 WBC/hpf   RBC / HPF 0-2  <3 RBC/hpf   Bacteria, UA FEW (*) RARE  CBC     Status: Abnormal   Collection Time    07/16/13  5:25 AM      Result Value Range   WBC 15.6 (*) 4.0 - 10.5 K/uL   RBC 4.61  4.22 - 5.81 MIL/uL   Hemoglobin 13.7  13.0 - 17.0 g/dL   HCT 40.2  39.0 - 52.0 %   MCV 87.2  78.0 - 100.0 fL   MCH 29.7  26.0 - 34.0 pg   MCHC 34.1  30.0 - 36.0 g/dL   RDW 14.4  11.5 - 15.5 %   Platelets 124 (*) 150 - 400 K/uL  COMPREHENSIVE METABOLIC PANEL     Status: Abnormal   Collection Time    07/16/13  5:25 AM      Result Value Range   Sodium 142  137 - 147 mEq/L   Potassium 3.6 (*) 3.7 - 5.3 mEq/L   Chloride 102  96 - 112 mEq/L   CO2 26  19 - 32 mEq/L   Glucose, Bld 113 (*) 70 - 99 mg/dL   BUN 24 (*) 6 - 23 mg/dL   Creatinine, Ser 1.21  0.50 - 1.35 mg/dL   Calcium 9.0  8.4 - 10.5 mg/dL   Total Protein 6.7  6.0 - 8.3 g/dL   Albumin 3.0 (*) 3.5 - 5.2 g/dL   AST 15  0 - 37 U/L   ALT 36  0 - 53 U/L   Alkaline Phosphatase 75  39 - 117 U/L   Total Bilirubin 1.9 (*) 0.3 - 1.2 mg/dL   GFR calc non Af Amer 58 (*) >90 mL/min   GFR calc Af Amer 67 (*) >90 mL/min   Comment: (NOTE)     The eGFR has been calculated using the CKD EPI equation.     This calculation has not been validated in all clinical situations.     eGFR's persistently <90 mL/min signify possible Chronic Kidney     Disease.  AMYLASE     Status: None   Collection Time    07/16/13  5:25 AM      Result Value Range   Amylase 18  0 - 105 U/L  LIPASE, BLOOD     Status: None   Collection Time    07/16/13  5:25 AM      Result Value Range   Lipase 18  11 - 59 U/L  PROTIME-INR  Status: Abnormal   Collection Time    07/16/13  9:10 AM      Result Value Range   Prothrombin  Time 15.7 (*) 11.6 - 15.2 seconds   INR 1.28  0.00 - 1.49   Dg Chest 2 View  07/15/2013   CLINICAL DATA:  Fever, vomiting, abdominal pain.  EXAM: CHEST  2 VIEW  COMPARISON:  06/21/2012  FINDINGS: There is marked dilatation of the right hemidiaphragm which is a new finding. There is associated atelectasis at the right base. Left lung is grossly clear. Normal heart size. No pneumothorax.  IMPRESSION: New elevation of the right hemidiaphragm. Differential diagnosis includes subpulmonic pleural effusion and splinting of the right hemidiaphragm secondary to an abdominal inflammatory process. There is associated atelectasis at the right lung base   Electronically Signed   By: Maryclare Bean M.D.   On: 07/15/2013 21:37   US Abdomen Limited  07/15/2013   CLINICAL DATA:  73 year old with a right upper quadrant abdominal pain.  EXAM: US ABDOMEN LIMITED - RIGHT UPPER QUADRANT  COMPARISON:  CT abdomen and pelvis 02/05/2012, 03/08/2010, 01/09/2010.  FINDINGS: Gallbladder  Echogenic sludge and numerous small shadowing gallstones. Mild gallbladder wall thickening up to 5 mm. No visible pericholecystic fluid. Negative sonographic Murphy sign according to the ultrasound technologist.  Common bile duct  Diameter: 3-6 mm.  No visible bile duct stones.  Liver:  Diffusely increased and coarsened echotexture without focal hepatic parenchymal abnormality. Patent portal vein with hepatopetal flow.  IMPRESSION: 1. Cholelithiasis and gallbladder sludge. Mild gallbladder wall thickening is consistent with cholecystitis which may be acute or chronic. 2. No biliary ductal dilation. 3. Diffuse hepatic steatosis and/or hepatocellular disease. No focal hepatic parenchymal abnormality.   Electronically Signed   By: Evangeline Dakin M.D.   On: 07/15/2013 18:43    Review of Systems  Constitutional: Positive for malaise/fatigue. Negative for fever and chills.  Respiratory: Negative for cough.   Cardiovascular: Negative for chest pain.   Gastrointestinal: Positive for nausea, abdominal pain and diarrhea. Negative for vomiting.  Musculoskeletal: Negative for back pain.  Neurological: Positive for weakness. Negative for headaches.    Blood pressure 141/63, pulse 88, temperature 97.6 F (36.4 C), temperature source Oral, resp. rate 19, height 5' 10"  (1.778 m), weight 190 lb 14.4 oz (86.592 kg), SpO2 95.00%. Physical Exam  Constitutional: He is oriented to person, place, and time. He appears well-developed.  Cardiovascular: Normal rate, regular rhythm and normal heart sounds.   No murmur heard. Respiratory: Effort normal and breath sounds normal.  GI: Soft. Bowel sounds are normal. There is tenderness. There is guarding.  Does have RUQ pain to palpation  Musculoskeletal: Normal range of motion.  Neurological: He is alert and oriented to person, place, and time.  Skin: Skin is warm and dry.  Psychiatric: He has a normal mood and affect. His behavior is normal. Judgment and thought content normal.     Assessment/Plan RUQ pain x 1 week came to ER with N/V/D- now resolved US reveals acute cholecystitis Request for Per chole drain per [facility] Scheduled now for HIDA per Dr Vernard Gambles Plan for percutaneous cholecystectomy drain if needed after results noted of HIDA Pt and wife aware of procedure benefits and risks and agreeable to proceed Consent signed and in chart  Connelly Spruell A 07/16/2013, 11:55 AM

## 2013-07-16 NOTE — Progress Notes (Signed)
[facility]                                                                                Patient Demographics  [name], is a 73 y.o. male, DOB - July 27, 1940, POE:[id number]  Admit date - 07/15/2013   Admitting Physician Merton Border, MD  Outpatient Primary MD for the patient is [name], MD  LOS - 1   Chief Complaint  Patient presents with  . Abdominal Pain  . Diarrhea        Assessment & Plan    1. Acute Cholecystitis - being seen by general surgery, on empiric IV antibiotics, per general surgery will benefit from gallbladder drain placement through IR other than surgical resection, due for drain placement today. Clinically somewhat better. Surprisingly pain-free. Continue gentle IV fluids, will be guided by the recommendation of general surgery for this issue.   2. History of PAD. Status post stent placement by Dr. Bridgett Larsson few years ago, stable, currently off of Plavix due to #1 above, will place on aspirin for now. Once GI issues have resolved resume Plavix. Continue statin for secondary prevention    3. His lipidemia continue home dose statin.    4. GERD on IV PPI for now.    5. BPH. Continue home dose alpha blockers.    Code Status: full  Family Communication: Wife  Disposition Plan: To be decided   Procedures CT abdomen pelvis, due for cholecystectomy drain placement on 07/16/2013   Consults  Surgery, IR   Medications  Scheduled Meds: . aspirin EC  325 mg Oral Daily  . diltiazem  120 mg Oral BID  . finasteride  5 mg Oral QPM  . heparin  5,000 Units Subcutaneous Q8H  . pantoprazole (PROTONIX) IV  40 mg Intravenous Daily  . piperacillin-tazobactam (ZOSYN)  IV  3.375 g Intravenous Q8H  . tamsulosin  0.4 mg Oral QPC supper   Continuous Infusions: . dextrose 5 % and 0.2 % NaCl with KCl 20 mEq     PRN Meds:.acetaminophen, acetaminophen, morphine injection, ondansetron (ZOFRAN) IV, ondansetron (ZOFRAN) IV, ondansetron, oxyCODONE,  zolpidem  DVT Prophylaxis  Heparin    Lab Results  Component Value Date   PLT 124* 07/16/2013    Antibiotics     Anti-infectives   Start     Dose/Rate Route Frequency Ordered Stop   07/16/13 0100  piperacillin-tazobactam (ZOSYN) IVPB 3.375 g     3.375 g 12.5 mL/hr over 240 Minutes Intravenous Every 8 hours 07/15/13 2118     07/15/13 1900  piperacillin-tazobactam (ZOSYN) IVPB 3.375 g     3.375 g 100 mL/hr over 30 Minutes Intravenous  Once 07/15/13 1846 07/15/13 2000          Subjective:   [name] today has, No headache, No chest pain, No abdominal pain - No Nausea, No new weakness tingling or numbness, No Cough - SOB.    Objective:   Filed Vitals:   07/15/13 2230 07/15/13 2309 07/16/13 0133 07/16/13 0500  BP: 124/49 134/63 127/62 142/72  Pulse: 100 102 98 100  Temp:  97.4 F (36.3 C) 97.8 F (36.6 C) 98.2 F (36.8 C)  TempSrc:  Oral Oral Oral  Resp: 22 22 22 22   Height:  5\' 10"  (1.778 m)    Weight:  86.592 kg (190 lb 14.4 oz)    SpO2: 93% 93% 91% 91%    Wt Readings from Last 3 Encounters:  07/15/13 86.592 kg (190 lb 14.4 oz)  07/15/13 87.091 kg (192 lb)  06/11/13 92.194 kg (203 lb 4 oz)     Intake/Output Summary (Last 24 hours) at 07/16/13 0917 Last data filed at 07/16/13 0915  Gross per 24 hour  Intake      0 ml  Output    475 ml  Net   -475 ml    Exam Awake Alert, Oriented X 3, No new F.N deficits, Normal affect [place].AT,PERRAL Supple Neck,No JVD, No cervical lymphadenopathy appriciated.  Symmetrical Chest wall movement, Good air movement bilaterally, CTAB RRR,No Gallops,Rubs or new Murmurs, No Parasternal Heave +ve B.Sounds, Abd Soft, RUQ is tender, No organomegaly appriciated, No rebound - guarding or rigidity. No Cyanosis, Clubbing or edema, No new Rash or bruise     Data Review   Micro Results No results found for this or any previous visit (from the past 240 hour(s)).  Radiology Reports Dg Chest 2 View  07/15/2013   CLINICAL DATA:   Fever, vomiting, abdominal pain.  EXAM: CHEST  2 VIEW  COMPARISON:  06/21/2012  FINDINGS: There is marked dilatation of the right hemidiaphragm which is a new finding. There is associated atelectasis at the right base. Left lung is grossly clear. Normal heart size. No pneumothorax.  IMPRESSION: New elevation of the right hemidiaphragm. Differential diagnosis includes subpulmonic pleural effusion and splinting of the right hemidiaphragm secondary to an abdominal inflammatory process. There is associated atelectasis at the right lung base   Electronically Signed   By: Maryclare Bean M.D.   On: 07/15/2013 21:37   US Abdomen Limited  07/15/2013   CLINICAL DATA:  74 year old with a right upper quadrant abdominal pain.  EXAM: US ABDOMEN LIMITED - RIGHT UPPER QUADRANT  COMPARISON:  CT abdomen and pelvis 02/05/2012, 03/08/2010, 01/09/2010.  FINDINGS: Gallbladder  Echogenic sludge and numerous small shadowing gallstones. Mild gallbladder wall thickening up to 5 mm. No visible pericholecystic fluid. Negative sonographic Murphy sign according to the ultrasound technologist.  Common bile duct  Diameter: 3-6 mm.  No visible bile duct stones.  Liver:  Diffusely increased and coarsened echotexture without focal hepatic parenchymal abnormality. Patent portal vein with hepatopetal flow.  IMPRESSION: 1. Cholelithiasis and gallbladder sludge. Mild gallbladder wall thickening is consistent with cholecystitis which may be acute or chronic. 2. No biliary ductal dilation. 3. Diffuse hepatic steatosis and/or hepatocellular disease. No focal hepatic parenchymal abnormality.   Electronically Signed   By: Evangeline Dakin M.D.   On: 07/15/2013 18:43    CBC  Recent Labs Lab 07/15/13 1719 07/16/13 0525  WBC 18.6* 15.6*  HGB 14.6 13.7  HCT 43.0 40.2  PLT 140* 124*  MCV 89.2 87.2  MCH 30.3 29.7  MCHC 34.0 34.1  RDW 14.4 14.4  LYMPHSABS 0.8  --   MONOABS 2.1*  --   EOSABS 0.0  --   BASOSABS 0.0  --     Chemistries   Recent  Labs Lab 07/15/13 1719 07/16/13 0525  NA 141 142  K 3.9 3.6*  CL 99 102  CO2 28 26  GLUCOSE 118* 113*  BUN 24* 24*  CREATININE 1.44* 1.21  CALCIUM 9.0 9.0  AST 20 15  ALT 47 36  ALKPHOS 68 75  BILITOT 2.4* 1.9*   ------------------------------------------------------------------------------------------------------------------  estimated creatinine clearance is 56.1 ml/min (by C-G formula based on Cr of 1.21). ------------------------------------------------------------------------------------------------------------------ No results found for this basename: HGBA1C,  in the last 72 hours ------------------------------------------------------------------------------------------------------------------ No results found for this basename: CHOL, HDL, LDLCALC, TRIG, CHOLHDL, LDLDIRECT,  in the last 72 hours ------------------------------------------------------------------------------------------------------------------ No results found for this basename: TSH, T4TOTAL, FREET3, T3FREE, THYROIDAB,  in the last 72 hours ------------------------------------------------------------------------------------------------------------------ No results found for this basename: VITAMINB12, FOLATE, FERRITIN, TIBC, IRON, RETICCTPCT,  in the last 72 hours  Coagulation profile No results found for this basename: INR, PROTIME,  in the last 168 hours  No results found for this basename: DDIMER,  in the last 72 hours  Cardiac Enzymes No results found for this basename: CK, CKMB, TROPONINI, MYOGLOBIN,  in the last 168 hours ------------------------------------------------------------------------------------------------------------------ No components found with this basename: POCBNP,      Time Spent in minutes  35   SINGH,PRASHANT K M.D on 07/16/2013 at 9:17 AM  Between 7am to 7pm - Pager - [phone]  After 7pm go to [email or web address] - password [email or web address]  And look for the night coverage person  covering for me after hours  [facility] Office  [phone]

## 2013-07-17 ENCOUNTER — Inpatient Hospital Stay (HOSPITAL_COMMUNITY): Payer: Medicare Other

## 2013-07-17 DIAGNOSIS — I739 Peripheral vascular disease, unspecified: Secondary | ICD-10-CM

## 2013-07-17 DIAGNOSIS — I10 Essential (primary) hypertension: Secondary | ICD-10-CM

## 2013-07-17 DIAGNOSIS — K219 Gastro-esophageal reflux disease without esophagitis: Secondary | ICD-10-CM

## 2013-07-17 DIAGNOSIS — E785 Hyperlipidemia, unspecified: Secondary | ICD-10-CM

## 2013-07-17 LAB — COMPREHENSIVE METABOLIC PANEL
ALT: 26 U/L (ref 0–53)
AST: 13 U/L (ref 0–37)
Albumin: 2.8 g/dL — ABNORMAL LOW (ref 3.5–5.2)
Alkaline Phosphatase: 71 U/L (ref 39–117)
BUN: 25 mg/dL — ABNORMAL HIGH (ref 6–23)
CO2: 28 meq/L (ref 19–32)
Calcium: 9 mg/dL (ref 8.4–10.5)
Chloride: 103 mEq/L (ref 96–112)
Creatinine, Ser: 1.31 mg/dL (ref 0.50–1.35)
GFR calc Af Amer: 61 mL/min — ABNORMAL LOW (ref >90)
GFR calc non Af Amer: 52 mL/min — ABNORMAL LOW (ref >90)
Glucose, Bld: 113 mg/dL — ABNORMAL HIGH (ref 70–99)
Potassium: 3.8 mEq/L (ref 3.7–5.3)
SODIUM: 143 meq/L (ref 137–147)
TOTAL PROTEIN: 6.5 g/dL (ref 6.0–8.3)
Total Bilirubin: 1.2 mg/dL (ref 0.3–1.2)

## 2013-07-17 LAB — CBC
HCT: 38.8 % — ABNORMAL LOW (ref 39.0–52.0)
Hemoglobin: 12.9 g/dL — ABNORMAL LOW (ref 13.0–17.0)
MCH: 29.9 pg (ref 26.0–34.0)
MCHC: 33.2 g/dL (ref 30.0–36.0)
MCV: 89.8 fL (ref 78.0–100.0)
PLATELETS: 140 10*3/uL — AB (ref 150–400)
RBC: 4.32 MIL/uL (ref 4.22–5.81)
RDW: 14.4 % (ref 11.5–15.5)
WBC: 12.8 10*3/uL — ABNORMAL HIGH (ref 4.0–10.5)

## 2013-07-17 MED ORDER — FENTANYL CITRATE 0.05 MG/ML IJ SOLN
INTRAMUSCULAR | Status: AC
  Filled 2013-07-17: qty 4

## 2013-07-17 MED ORDER — IPRATROPIUM-ALBUTEROL 0.5-2.5 (3) MG/3ML IN SOLN
3.0000 mL | RESPIRATORY_TRACT | Status: DC
  Administered 2013-07-17: 3 mL via RESPIRATORY_TRACT
  Filled 2013-07-17: qty 3

## 2013-07-17 MED ORDER — FENTANYL CITRATE 0.05 MG/ML IJ SOLN
INTRAMUSCULAR | Status: AC | PRN
  Administered 2013-07-17 (×2): 50 ug via INTRAVENOUS

## 2013-07-17 MED ORDER — SIMVASTATIN 10 MG PO TABS
10.0000 mg | ORAL_TABLET | Freq: Every day | ORAL | Status: DC
Start: 2013-07-17 — End: 2013-07-19
  Administered 2013-07-17 – 2013-07-18 (×2): 10 mg via ORAL
  Filled 2013-07-17 (×3): qty 1

## 2013-07-17 MED ORDER — MEPERIDINE HCL 50 MG/ML IJ SOLN
INTRAMUSCULAR | Status: AC
  Filled 2013-07-17: qty 1

## 2013-07-17 MED ORDER — MEPERIDINE HCL 25 MG/ML IJ SOLN
INTRAMUSCULAR | Status: AC | PRN
  Administered 2013-07-17: 25 mg via INTRAVENOUS

## 2013-07-17 MED ORDER — MIDAZOLAM HCL 2 MG/2ML IJ SOLN
INTRAMUSCULAR | Status: AC | PRN
  Administered 2013-07-17 (×2): 1 mg via INTRAVENOUS

## 2013-07-17 MED ORDER — IOHEXOL 300 MG/ML  SOLN
50.0000 mL | Freq: Once | INTRAMUSCULAR | Status: AC | PRN
  Administered 2013-07-17: 15 mL

## 2013-07-17 MED ORDER — MIDAZOLAM HCL 2 MG/2ML IJ SOLN
INTRAMUSCULAR | Status: AC
  Filled 2013-07-17: qty 4

## 2013-07-17 NOTE — Progress Notes (Signed)
This should help stabilize him and consider surgery in 6 - 8 weeks if he can be medically  Cleared.

## 2013-07-17 NOTE — ED Notes (Signed)
Patient denies pain and is resting comfortably.  

## 2013-07-17 NOTE — Progress Notes (Signed)
Subjective: Pt okay, but got a bit hypotensive and tachycardic after the IR per chole tube procedure.  No pain N/V.  Wants water/ice.    Objective: Vital signs in last 24 hours: Temp:  [97.6 F (36.4 C)-99.1 F (37.3 C)] 98.5 F (36.9 C) (01/14 0601) Pulse Rate:  [82-107] 92 (01/14 0920) Resp:  [14-22] 14 (01/14 0920) BP: (125-201)/(57-113) 125/71 mmHg (01/14 0920) SpO2:  [89 %-100 %] 95 % (01/14 0920) Last BM Date: 07/16/13  Intake/Output from previous day: 01/13 0701 - 01/14 0700 In: 1393.3 [P.O.:200; I.V.:1093.3; IV Piggyback:100] Out: 127 [Urine:127] Intake/Output this shift:    PE: Gen:  Alert, NAD, pleasant Abd: Soft, NT, mild distension, +BS, no HSM, per chole tube noted with a good amount of brownish drainage   Lab Results:   Recent Labs  07/16/13 0525 07/17/13 0415  WBC 15.6* 12.8*  HGB 13.7 12.9*  HCT 40.2 38.8*  PLT 124* 140*   BMET  Recent Labs  07/16/13 0525 07/17/13 0415  NA 142 143  K 3.6* 3.8  CL 102 103  CO2 26 28  GLUCOSE 113* 113*  BUN 24* 25*  CREATININE 1.21 1.31  CALCIUM 9.0 9.0   PT/INR  Recent Labs  07/16/13 0910  LABPROT 15.7*  INR 1.28   CMP     Component Value Date/Time   NA 143 07/17/2013 0415   K 3.8 07/17/2013 0415   CL 103 07/17/2013 0415   CO2 28 07/17/2013 0415   GLUCOSE 113* 07/17/2013 0415   BUN 25* 07/17/2013 0415   CREATININE 1.31 07/17/2013 0415   CALCIUM 9.0 07/17/2013 0415   PROT 6.5 07/17/2013 0415   ALBUMIN 2.8* 07/17/2013 0415   AST 13 07/17/2013 0415   ALT 26 07/17/2013 0415   ALKPHOS 71 07/17/2013 0415   BILITOT 1.2 07/17/2013 0415   GFRNONAA 52* 07/17/2013 0415   GFRAA 61* 07/17/2013 0415   Lipase     Component Value Date/Time   LIPASE 18 07/16/2013 0525       Studies/Results: Dg Chest 2 View  07/15/2013   CLINICAL DATA:  Fever, vomiting, abdominal pain.  EXAM: CHEST  2 VIEW  COMPARISON:  06/21/2012  FINDINGS: There is marked dilatation of the right hemidiaphragm which is a new finding. There  is associated atelectasis at the right base. Left lung is grossly clear. Normal heart size. No pneumothorax.  IMPRESSION: New elevation of the right hemidiaphragm. Differential diagnosis includes subpulmonic pleural effusion and splinting of the right hemidiaphragm secondary to an abdominal inflammatory process. There is associated atelectasis at the right lung base   Electronically Signed   By: Maryclare Bean M.D.   On: 07/15/2013 21:37   Nm Hepatobiliary Liver Func  07/16/2013   CLINICAL DATA:  Acute cholecystitis suspected. Right upper quadrant abdominal pain and nausea.  EXAM: NUCLEAR MEDICINE HEPATOBILIARY IMAGING  TECHNIQUE: Sequential images of the abdomen were obtained out to 60 minutes following intravenous administration of radiopharmaceutical.  COMPARISON:  Abdominal ultrasound, 07/15/2013.  RADIOPHARMACEUTICALS:  75mCi Tc-77m Choletec  FINDINGS: There is prompt and homogeneous accumulation of radiotracer by the liver with prompt excretion into the intra and extrahepatic biliary tree. Small bowel activity was seen early during the examination. This indicates patency of the common bile duct.  3.5 mg of morphine was injected to promote gallbladder filling. Despite morphine, no gallbladder filling was noted during the examination, consistent with an obstructed cystic duct.  IMPRESSION: 1. Nonvisualization of the gallbladder consistent with an obstructed cystic duct an acute cholecystitis. 2.  Normal liver.  Patent common bile duct.   Electronically Signed   By: Lajean Manes M.D.   On: 07/16/2013 16:19   US Abdomen Limited  07/15/2013   CLINICAL DATA:  73 year old with a right upper quadrant abdominal pain.  EXAM: US ABDOMEN LIMITED - RIGHT UPPER QUADRANT  COMPARISON:  CT abdomen and pelvis 02/05/2012, 03/08/2010, 01/09/2010.  FINDINGS: Gallbladder  Echogenic sludge and numerous small shadowing gallstones. Mild gallbladder wall thickening up to 5 mm. No visible pericholecystic fluid. Negative sonographic  Murphy sign according to the ultrasound technologist.  Common bile duct  Diameter: 3-6 mm.  No visible bile duct stones.  Liver:  Diffusely increased and coarsened echotexture without focal hepatic parenchymal abnormality. Patent portal vein with hepatopetal flow.  IMPRESSION: 1. Cholelithiasis and gallbladder sludge. Mild gallbladder wall thickening is consistent with cholecystitis which may be acute or chronic. 2. No biliary ductal dilation. 3. Diffuse hepatic steatosis and/or hepatocellular disease. No focal hepatic parenchymal abnormality.   Electronically Signed   By: Evangeline Dakin M.D.   On: 07/15/2013 18:43   Ir Perc Cholecystostomy  07/17/2013   CLINICAL DATA:  Acute cholecystitis  EXAM: ULTRASOUND GUIDANCE FOR ACCESS  PERCUTANEOUS TRANSHEPATIC CHOLECYSTOSTOMY  Date:  1/14/20151/14/2015 9:01 AM  Radiologist:  Jerilynn Mages. Daryll Brod, MD  Guidance:  Ultrasound and fluoroscopic  MEDICATIONS AND MEDICAL HISTORY: Patient is already receiving daily antibiotics, 2 mg Versed, 100 mcg fentanyl, 25 mg Demerol  ANESTHESIA/SEDATION: 12 min  CONTRAST:  30mL OMNIPAQUE IOHEXOL 300 MG/ML  SOLN  FLUOROSCOPY TIME:  36 seconds  PROCEDURE: Informed consent was obtained from the patient following explanation of the procedure, risks, benefits and alternatives. The patient understands, agrees and consents for the procedure. All questions were addressed. A time out was performed.  Maximal barrier sterile technique utilized including caps, mask, sterile gowns, sterile gloves, large sterile drape, hand hygiene, and Betadine.  Previous imaging reviewed. Preliminary ultrasound performed in the right upper quadrant. Abnormal gallbladder with wall thickening and small stones demonstrated. There is a percutaneous transhepatic window noted beneath the right subcostal margin. Under sterile conditions and local anesthesia, a 21 gauge access needle was advanced percutaneously into the abnormal gallbladder. Needle position confirmed with  ultrasound. Bile was aspirated. Sample sent for Gram stain and culture. Guidewire inserted followed by Accustick dilator set. Guidewire exchanged for an Amplatz guidewire. Tract dilatation performed to inserted 10 [place] drain. Retention loop formed in the gallbladder. Position confirmed with fluoroscopy and a contrast injection. Images obtained for documentation. Catheter secured with the Prolene suture and connected to external drainage. Sterile dressing applied.  COMPLICATIONS: No immediate  IMPRESSION: Successful ultrasound and fluoroscopic percutaneous transhepatic cholecystostomy.   Electronically Signed   By: Daryll Brod M.D.   On: 07/17/2013 09:27    Anti-infectives: Anti-infectives   Start     Dose/Rate Route Frequency Ordered Stop   07/16/13 0100  piperacillin-tazobactam (ZOSYN) IVPB 3.375 g     3.375 g 12.5 mL/hr over 240 Minutes Intravenous Every 8 hours 07/15/13 2118     07/15/13 1900  piperacillin-tazobactam (ZOSYN) IVPB 3.375 g     3.375 g 100 mL/hr over 30 Minutes Intravenous  Once 07/15/13 1846 07/15/13 2000       Assessment/Plan Acute cholecystitis POD #0 s/p perc chole tube Leukocytosis H/o PAD Hyperlipidemia GERD BPH  Plan: 1.  Just had IR perc chole tube - draining effectively 2.  Was a bit hypotensive, tachycardic right after the procedure, more stable now 3.  Can start on clear liquids as  tolerated 4.  Plavix can likely be resumed tomorrow if Hgb stable since no plans to take to OR 5.  May need interval lap chole in 6-8 weeks once he is improved from this and he can be medically cleared for surgery off plavix/aspirin    LOS: 2 days    [place], [facility] 07/17/2013, 9:57 AM Pager: [phone]

## 2013-07-17 NOTE — ED Notes (Signed)
Pt c/o of severe pain, and became diaphoretic. ST in 110's BP 200's o2 sat 86%. Dr. Annamaria Boots ordered demerol 25mg  IV

## 2013-07-17 NOTE — Progress Notes (Signed)
[facility]                                                                              Patient Demographics  [name], is a 73 y.o. male, DOB - [birth year], VZC:[id number]  Admit date - 07/15/2013   Admitting Physician Merton Border, MD  Outpatient Primary MD for the patient is [name], MD  LOS - 2   Chief Complaint  Patient presents with  . Abdominal Pain  . Diarrhea        Assessment & Plan   Acute cholecystitis -General surgery has been consulted -Patient had drain placement today -Leukocytosis trending downward -Will continue empiric IV antibiotics, IV fluids -Per surgery, will need to arthroscopic cholecystectomy in 6-8 weeks once he is improved, may restart Plavix 07/18/2013 a hemoglobin stable  History PAD -Stable, status post stent placement by Dr. Bridgett Larsson 2 years ago -Plavix held secondary to drain placement today, we'll likely restart tomorrow -Will continue aspirin, and start statin  Hyperlipidemia -Will start statin  GERD -Continue Protonix 40 mg IV daily  BPH -Continue Proscar and Flomax  Hypertension -Currently controlled  Code Status: Full  Family Communication: Wife at bedside  Disposition Plan: Admitted  Time Spent in minutes   35 minutes  Procedures  Percutaneous cholecystotomy  Consults   Surgery Radiology  DVT Prophylaxis  heparin  Lab Results  Component Value Date   PLT 140* 07/17/2013    Medications  Scheduled Meds: . antiseptic oral rinse  15 mL Mouth Rinse q12n4p  . aspirin EC  325 mg Oral Daily  . chlorhexidine  15 mL Mouth Rinse BID  . diltiazem  120 mg Oral BID  . fentaNYL      . finasteride  5 mg Oral QPM  . heparin  5,000 Units Subcutaneous Q8H  . meperidine      . midazolam      . pantoprazole (PROTONIX) IV  40 mg Intravenous Daily  . piperacillin-tazobactam (ZOSYN)  IV  3.375 g Intravenous Q8H  . tamsulosin  0.4 mg Oral QPC supper   Continuous Infusions: . dextrose 5 % and 0.2 % NaCl with  KCl 20 mEq 50 mL/hr at 07/17/13 1208   PRN Meds:.acetaminophen, acetaminophen, morphine injection, ondansetron (ZOFRAN) IV, ondansetron (ZOFRAN) IV, ondansetron, oxyCODONE, zolpidem  Antibiotics   Anti-infectives   Start     Dose/Rate Route Frequency Ordered Stop   07/16/13 0100  piperacillin-tazobactam (ZOSYN) IVPB 3.375 g     3.375 g 12.5 mL/hr over 240 Minutes Intravenous Every 8 hours 07/15/13 2118     07/15/13 1900  piperacillin-tazobactam (ZOSYN) IVPB 3.375 g     3.375 g 100 mL/hr over 30 Minutes Intravenous  Once 07/15/13 1846 07/15/13 2000        Subjective:   [name] seen and examined today.  Currently has no complaints. Is very thankful he is able to have liquids. Denies any chest pain, shortness of breath, dizziness.  Objective:   Filed Vitals:   07/17/13 0850 07/17/13 0913 07/17/13 0920 07/17/13 1000  BP: 172/77 147/68 125/71 134/63  Pulse: 107 89 92 88  Temp:    98.1 F (36.7 C)  TempSrc:    Oral  Resp: 21 20 14 17   Height:      Weight:      SpO2: 94% 100% 95% 93%    Wt Readings from Last 3 Encounters:  07/15/13 86.592 kg (190 lb 14.4 oz)  07/15/13 87.091 kg (192 lb)  06/11/13 92.194 kg (203 lb 4 oz)     Intake/Output Summary (Last 24 hours) at 07/17/13 1238 Last data filed at 07/17/13 0600  Gross per 24 hour  Intake 1243.33 ml  Output      1 ml  Net 1242.33 ml    Exam  General: Well developed, well nourished, NAD, appears stated age  HEENT: NCAT, PERRLA, EOMI, Anicteic Sclera, mucous membranes moist. No pharyngeal erythema or exudates  Neck: Supple, no JVD, no masses  Cardiovascular: S1 S2 auscultated, no rubs, murmurs or gallops. Regular rate and rhythm.  Respiratory: Clear to auscultation bilaterally with equal chest rise, upper airway wheezing  Abdomen: Soft, RUQ tenderness, nondistended, + bowel sounds, drain placed.  Extremities: warm dry without cyanosis clubbing or edema  Neuro: AAOx3, cranial nerves grossly intact.  Strength 5/5 in patient's upper and lower extremities bilaterally  Skin: Without rashes exudates or nodules  Psych: Normal affect and demeanor with intact judgement and insight   Data Review   Micro Results No results found for this or any previous visit (from the past 240 hour(s)).  Radiology Reports Dg Chest 2 View  07/15/2013   CLINICAL DATA:  Fever, vomiting, abdominal pain.  EXAM: CHEST  2 VIEW  COMPARISON:  06/21/2012  FINDINGS: There is marked dilatation of the right hemidiaphragm which is a new finding. There is associated atelectasis at the right base. Left lung is grossly clear. Normal heart size. No pneumothorax.  IMPRESSION: New elevation of the right hemidiaphragm. Differential diagnosis includes subpulmonic pleural effusion and splinting of the right hemidiaphragm secondary to an abdominal inflammatory process. There is associated atelectasis at the right lung base   Electronically Signed   By: Maryclare Bean M.D.   On: 07/15/2013 21:37   Nm Hepatobiliary Liver Func  07/16/2013   CLINICAL DATA:  Acute cholecystitis suspected. Right upper quadrant abdominal pain and nausea.  EXAM: NUCLEAR MEDICINE HEPATOBILIARY IMAGING  TECHNIQUE: Sequential images of the abdomen were obtained out to 60 minutes following intravenous administration of radiopharmaceutical.  COMPARISON:  Abdominal ultrasound, 07/15/2013.  RADIOPHARMACEUTICALS:  41mCi Tc-76m Choletec  FINDINGS: There is prompt and homogeneous accumulation of radiotracer by the liver with prompt excretion into the intra and extrahepatic biliary tree. Small bowel activity was seen early during the examination. This indicates patency of the common bile duct.  3.5 mg of morphine was injected to promote gallbladder filling. Despite morphine, no gallbladder filling was noted during the examination, consistent with an obstructed cystic duct.  IMPRESSION: 1. Nonvisualization of the gallbladder consistent with an obstructed cystic duct an acute  cholecystitis. 2. Normal liver.  Patent common bile duct.   Electronically Signed   By: Lajean Manes M.D.   On: 07/16/2013 16:19   US Abdomen Limited  07/15/2013   CLINICAL DATA:  73 year old with a right upper quadrant abdominal pain.  EXAM: US ABDOMEN LIMITED - RIGHT UPPER QUADRANT  COMPARISON:  CT abdomen and pelvis 02/05/2012, 03/08/2010, 01/09/2010.  FINDINGS: Gallbladder  Echogenic sludge and numerous small shadowing gallstones. Mild gallbladder wall thickening up to 5 mm. No visible pericholecystic fluid. Negative sonographic Murphy sign according to the ultrasound technologist.  Common bile duct  Diameter: 3-6 mm.  No visible bile duct stones.  Liver:  Diffusely increased and coarsened echotexture without focal hepatic parenchymal abnormality. Patent portal vein with hepatopetal flow.  IMPRESSION: 1. Cholelithiasis and gallbladder sludge. Mild gallbladder wall thickening is consistent with cholecystitis which may be acute or chronic. 2. No biliary ductal dilation. 3. Diffuse hepatic steatosis and/or hepatocellular disease. No focal hepatic parenchymal abnormality.   Electronically Signed   By: Evangeline Dakin M.D.   On: 07/15/2013 18:43   Ir Perc Cholecystostomy  07/17/2013   CLINICAL DATA:  Acute cholecystitis  EXAM: ULTRASOUND GUIDANCE FOR ACCESS  PERCUTANEOUS TRANSHEPATIC CHOLECYSTOSTOMY  Date:  1/14/20151/14/2015 9:01 AM  Radiologist:  Jerilynn Mages. Daryll Brod, MD  Guidance:  Ultrasound and fluoroscopic  MEDICATIONS AND MEDICAL HISTORY: Patient is already receiving daily antibiotics, 2 mg Versed, 100 mcg fentanyl, 25 mg Demerol  ANESTHESIA/SEDATION: 12 min  CONTRAST:  16mL OMNIPAQUE IOHEXOL 300 MG/ML  SOLN  FLUOROSCOPY TIME:  36 seconds  PROCEDURE: Informed consent was obtained from the patient following explanation of the procedure, risks, benefits and alternatives. The patient understands, agrees and consents for the procedure. All questions were addressed. A time out was performed.  Maximal barrier  sterile technique utilized including caps, mask, sterile gowns, sterile gloves, large sterile drape, hand hygiene, and Betadine.  Previous imaging reviewed. Preliminary ultrasound performed in the right upper quadrant. Abnormal gallbladder with wall thickening and small stones demonstrated. There is a percutaneous transhepatic window noted beneath the right subcostal margin. Under sterile conditions and local anesthesia, a 21 gauge access needle was advanced percutaneously into the abnormal gallbladder. Needle position confirmed with ultrasound. Bile was aspirated. Sample sent for Gram stain and culture. Guidewire inserted followed by Accustick dilator set. Guidewire exchanged for an Amplatz guidewire. Tract dilatation performed to inserted 10 [place] drain. Retention loop formed in the gallbladder. Position confirmed with fluoroscopy and a contrast injection. Images obtained for documentation. Catheter secured with the Prolene suture and connected to external drainage. Sterile dressing applied.  COMPLICATIONS: No immediate  IMPRESSION: Successful ultrasound and fluoroscopic percutaneous transhepatic cholecystostomy.   Electronically Signed   By: Daryll Brod M.D.   On: 07/17/2013 09:27    CBC  Recent Labs Lab 07/15/13 1719 07/16/13 0525 07/17/13 0415  WBC 18.6* 15.6* 12.8*  HGB 14.6 13.7 12.9*  HCT 43.0 40.2 38.8*  PLT 140* 124* 140*  MCV 89.2 87.2 89.8  MCH 30.3 29.7 29.9  MCHC 34.0 34.1 33.2  RDW 14.4 14.4 14.4  LYMPHSABS 0.8  --   --   MONOABS 2.1*  --   --   EOSABS 0.0  --   --   BASOSABS 0.0  --   --     Chemistries   Recent Labs Lab 07/15/13 1719 07/16/13 0525 07/17/13 0415  NA 141 142 143  K 3.9 3.6* 3.8  CL 99 102 103  CO2 28 26 28   GLUCOSE 118* 113* 113*  BUN 24* 24* 25*  CREATININE 1.44* 1.21 1.31  CALCIUM 9.0 9.0 9.0  AST 20 15 13   ALT 47 36 26  ALKPHOS 68 75 71  BILITOT 2.4* 1.9* 1.2    ------------------------------------------------------------------------------------------------------------------ estimated creatinine clearance is 51.9 ml/min (by C-G formula based on Cr of 1.31). ------------------------------------------------------------------------------------------------------------------ No results found for this basename: HGBA1C,  in the last 72 hours ------------------------------------------------------------------------------------------------------------------ No results found for this basename: CHOL, HDL, LDLCALC, TRIG, CHOLHDL, LDLDIRECT,  in the last 72 hours ------------------------------------------------------------------------------------------------------------------ No results found for this basename: TSH, T4TOTAL, FREET3, T3FREE, THYROIDAB,  in the last 72 hours ------------------------------------------------------------------------------------------------------------------ No results found for this basename: VITAMINB12,  FOLATE, FERRITIN, TIBC, IRON, RETICCTPCT,  in the last 72 hours  Coagulation profile  Recent Labs Lab 07/16/13 0910  INR 1.28    No results found for this basename: DDIMER,  in the last 72 hours  Cardiac Enzymes No results found for this basename: CK, CKMB, TROPONINI, MYOGLOBIN,  in the last 168 hours ------------------------------------------------------------------------------------------------------------------ No components found with this basename: POCBNP,     Yale Golla D.O. on 07/17/2013 at 12:38 PM  Between 7am to 7pm - Pager - [phone]  After 7pm go to [email or web address] - password [email or web address]  And look for the night coverage person covering for me after hours  [facility] Office  [phone]

## 2013-07-17 NOTE — ED Notes (Addendum)
Pt taken to radiology nursing holding area; VS returned to baseline; however pt is lethargic. Will continue to monitor closely for 30 minutes per Dr. Annamaria Boots.

## 2013-07-17 NOTE — Procedures (Signed)
Successful 10 fr perc cholecystostomy No comp Stable Keep to gravity bag Gs/cx sent

## 2013-07-18 LAB — CBC
HEMATOCRIT: 37.2 % — AB (ref 39.0–52.0)
Hemoglobin: 12.3 g/dL — ABNORMAL LOW (ref 13.0–17.0)
MCH: 29.7 pg (ref 26.0–34.0)
MCHC: 33.1 g/dL (ref 30.0–36.0)
MCV: 89.9 fL (ref 78.0–100.0)
Platelets: 136 10*3/uL — ABNORMAL LOW (ref 150–400)
RBC: 4.14 MIL/uL — ABNORMAL LOW (ref 4.22–5.81)
RDW: 14.3 % (ref 11.5–15.5)
WBC: 6.4 10*3/uL (ref 4.0–10.5)

## 2013-07-18 LAB — COMPREHENSIVE METABOLIC PANEL
ALK PHOS: 60 U/L (ref 39–117)
ALT: 23 U/L (ref 0–53)
AST: 13 U/L (ref 0–37)
Albumin: 2.5 g/dL — ABNORMAL LOW (ref 3.5–5.2)
BILIRUBIN TOTAL: 0.9 mg/dL (ref 0.3–1.2)
BUN: 15 mg/dL (ref 6–23)
CHLORIDE: 101 meq/L (ref 96–112)
CO2: 25 mEq/L (ref 19–32)
Calcium: 8.5 mg/dL (ref 8.4–10.5)
Creatinine, Ser: 0.94 mg/dL (ref 0.50–1.35)
GFR calc non Af Amer: 81 mL/min — ABNORMAL LOW (ref >90)
Glucose, Bld: 125 mg/dL — ABNORMAL HIGH (ref 70–99)
POTASSIUM: 3.5 meq/L — AB (ref 3.7–5.3)
Sodium: 139 mEq/L (ref 137–147)
Total Protein: 6.1 g/dL (ref 6.0–8.3)

## 2013-07-18 MED ORDER — POTASSIUM CHLORIDE CRYS ER 20 MEQ PO TBCR
40.0000 meq | EXTENDED_RELEASE_TABLET | Freq: Once | ORAL | Status: AC
  Administered 2013-07-18: 40 meq via ORAL
  Filled 2013-07-18: qty 2

## 2013-07-18 MED ORDER — IPRATROPIUM-ALBUTEROL 0.5-2.5 (3) MG/3ML IN SOLN
3.0000 mL | Freq: Two times a day (BID) | RESPIRATORY_TRACT | Status: DC
  Administered 2013-07-18 – 2013-07-19 (×3): 3 mL via RESPIRATORY_TRACT
  Filled 2013-07-18 (×3): qty 3

## 2013-07-18 MED ORDER — CLOPIDOGREL BISULFATE 75 MG PO TABS
75.0000 mg | ORAL_TABLET | Freq: Every day | ORAL | Status: DC
  Administered 2013-07-19: 75 mg via ORAL
  Filled 2013-07-18 (×2): qty 1

## 2013-07-18 NOTE — Progress Notes (Signed)
[facility]                                                                              Patient Demographics  [name], is a 73 y.o. male, DOB - 1940-10-24, CHE:[id number]  Admit date - 07/15/2013   Admitting Physician Merton Border, MD  Outpatient Primary MD for the patient is Chad Stain, MD  LOS - 3   Chief Complaint  Patient presents with  . Abdominal Pain  . Diarrhea        Assessment & Plan   Acute cholecystitis -General surgery and IR consulted -Patient had drain placement by IR, which will need to remain for 4-6 weeks -Leukocytosis trending downward -Will continue empiric IV antibiotics, IV fluids -Per surgery, will need to arthroscopic cholecystectomy in 8 weeks once he is improved, may restart Plavix 07/18/2013 a hemoglobin stable -Will advance diet as tolerated  History PAD -Stable, status post stent placement by Dr. Bridgett Larsson 2 years ago -Will restart Plavix today -Will continue aspirin, and start statin  Hyperlipidemia -Will start statin  GERD -Continue Protonix 40 mg IV daily  BPH -Continue Proscar and Flomax  Hypertension -Currently controlled  Code Status: Full  Family Communication: Wife at bedside  Disposition Plan: Admitted  Time Spent in minutes   35 minutes  Procedures  Percutaneous cholecystotomy  Consults   Surgery Radiology  DVT Prophylaxis  heparin  Lab Results  Component Value Date   PLT 136* 07/18/2013    Medications  Scheduled Meds: . aspirin EC  325 mg Oral Daily  . diltiazem  120 mg Oral BID  . finasteride  5 mg Oral QPM  . heparin  5,000 Units Subcutaneous Q8H  . ipratropium-albuterol  3 mL Nebulization BID  . pantoprazole (PROTONIX) IV  40 mg Intravenous Daily  . piperacillin-tazobactam (ZOSYN)  IV  3.375 g Intravenous Q8H  . simvastatin  10 mg Oral q1800  . tamsulosin  0.4 mg Oral QPC supper   Continuous Infusions:   PRN Meds:.acetaminophen, acetaminophen, morphine injection, ondansetron  (ZOFRAN) IV, ondansetron (ZOFRAN) IV, ondansetron, oxyCODONE, zolpidem  Antibiotics   Anti-infectives   Start     Dose/Rate Route Frequency Ordered Stop   07/16/13 0100  piperacillin-tazobactam (ZOSYN) IVPB 3.375 g     3.375 g 12.5 mL/hr over 240 Minutes Intravenous Every 8 hours 07/15/13 2118     07/15/13 1900  piperacillin-tazobactam (ZOSYN) IVPB 3.375 g     3.375 g 100 mL/hr over 30 Minutes Intravenous  Once 07/15/13 1846 07/15/13 2000        Subjective:   [name] seen and examined today.  Would like to eat.  Patient denies any SOB or chest pain.  Does complain of pain where the drain is placed.  Objective:   Filed Vitals:   07/17/13 1354 07/17/13 2113 07/17/13 2224 07/18/13 0520  BP: 126/61  136/57 127/59  Pulse: 95 118 73 70  Temp: 97.5 F (36.4 C)  98.1 F (36.7 C) 98.1 F (36.7 C)  TempSrc: Oral  Oral Oral  Resp: 16 16 16 16   Height:      Weight:      SpO2: 95% 97% 95% 96%    Wt  Readings from Last 3 Encounters:  07/15/13 86.592 kg (190 lb 14.4 oz)  07/15/13 87.091 kg (192 lb)  06/11/13 92.194 kg (203 lb 4 oz)     Intake/Output Summary (Last 24 hours) at 07/18/13 1206 Last data filed at 07/18/13 0545  Gross per 24 hour  Intake   1265 ml  Output    365 ml  Net    900 ml    Exam  General: Well developed, well nourished, NAD, appears stated age  HEENT: NCAT, PERRLA, EOMI, Anicteic Sclera, mucous membranes moist. Neck: Supple, no JVD, no masses  Cardiovascular: S1 S2 auscultated, no rubs, murmurs or gallops. Regular rate and rhythm.  Respiratory: Clear to auscultation bilaterally with equal chest rise  Abdomen: Soft, RUQ tenderness, nondistended, + bowel sounds, drain placed.  Extremities: warm dry without cyanosis clubbing or edema  Neuro: AAOx3, cranial nerves grossly intact.   Skin: Without rashes exudates or nodules  Psych: Normal affect and demeanor with intact judgement and insight   Data Review   Micro Results Recent Results  (from the past 240 hour(s))  CULTURE, ROUTINE-ABSCESS     Status: None   Collection Time    07/17/13  9:35 AM      Result Value Range Status   Specimen Description ABSCESS GALL BLADDER   Final   Special Requests NONE   Final   Gram [name]     Final   Value: NO WBC SEEN     NO SQUAMOUS EPITHELIAL CELLS SEEN     NO ORGANISMS SEEN     Performed at [facility]   Culture     Final   Value: NO GROWTH 1 DAY     Performed at [facility]   Report Status PENDING   Incomplete  ANAEROBIC CULTURE     Status: None   Collection Time    07/17/13  9:35 AM      Result Value Range Status   Specimen Description ABSCESS GALL BLADDER   Final   Special Requests NONE   Final   Gram [name]     Final   Value: NO WBC SEEN     NO SQUAMOUS EPITHELIAL CELLS SEEN     NO ORGANISMS SEEN     Performed at [facility]   Culture PENDING   Incomplete   Report Status PENDING   Incomplete    Radiology Reports Dg Chest 2 View  07/15/2013   CLINICAL DATA:  Fever, vomiting, abdominal pain.  EXAM: CHEST  2 VIEW  COMPARISON:  06/21/2012  FINDINGS: There is marked dilatation of the right hemidiaphragm which is a new finding. There is associated atelectasis at the right base. Left lung is grossly clear. Normal heart size. No pneumothorax.  IMPRESSION: New elevation of the right hemidiaphragm. Differential diagnosis includes subpulmonic pleural effusion and splinting of the right hemidiaphragm secondary to an abdominal inflammatory process. There is associated atelectasis at the right lung base   Electronically Signed   By: Maryclare Bean M.D.   On: 07/15/2013 21:37   Nm Hepatobiliary Liver Func  07/16/2013   CLINICAL DATA:  Acute cholecystitis suspected. Right upper quadrant abdominal pain and nausea.  EXAM: NUCLEAR MEDICINE HEPATOBILIARY IMAGING  TECHNIQUE: Sequential images of the abdomen were obtained out to 60 minutes following intravenous administration of radiopharmaceutical.  COMPARISON:  Abdominal  ultrasound, 07/15/2013.  RADIOPHARMACEUTICALS:  36mCi Tc-46m Choletec  FINDINGS: There is prompt and homogeneous accumulation of radiotracer by the liver with prompt excretion into the intra  and extrahepatic biliary tree. Small bowel activity was seen early during the examination. This indicates patency of the common bile duct.  3.5 mg of morphine was injected to promote gallbladder filling. Despite morphine, no gallbladder filling was noted during the examination, consistent with an obstructed cystic duct.  IMPRESSION: 1. Nonvisualization of the gallbladder consistent with an obstructed cystic duct an acute cholecystitis. 2. Normal liver.  Patent common bile duct.   Electronically Signed   By: Lajean Manes M.D.   On: 07/16/2013 16:19   US Abdomen Limited  07/15/2013   CLINICAL DATA:  73 year old with a right upper quadrant abdominal pain.  EXAM: US ABDOMEN LIMITED - RIGHT UPPER QUADRANT  COMPARISON:  CT abdomen and pelvis 02/05/2012, 03/08/2010, 01/09/2010.  FINDINGS: Gallbladder  Echogenic sludge and numerous small shadowing gallstones. Mild gallbladder wall thickening up to 5 mm. No visible pericholecystic fluid. Negative sonographic Murphy sign according to the ultrasound technologist.  Common bile duct  Diameter: 3-6 mm.  No visible bile duct stones.  Liver:  Diffusely increased and coarsened echotexture without focal hepatic parenchymal abnormality. Patent portal vein with hepatopetal flow.  IMPRESSION: 1. Cholelithiasis and gallbladder sludge. Mild gallbladder wall thickening is consistent with cholecystitis which may be acute or chronic. 2. No biliary ductal dilation. 3. Diffuse hepatic steatosis and/or hepatocellular disease. No focal hepatic parenchymal abnormality.   Electronically Signed   By: Evangeline Dakin M.D.   On: 07/15/2013 18:43   Ir Perc Cholecystostomy  07/17/2013   CLINICAL DATA:  Acute cholecystitis  EXAM: ULTRASOUND GUIDANCE FOR ACCESS  PERCUTANEOUS TRANSHEPATIC CHOLECYSTOSTOMY  Date:   1/14/20151/14/2015 9:01 AM  Radiologist:  Jerilynn Mages. Daryll Brod, MD  Guidance:  Ultrasound and fluoroscopic  MEDICATIONS AND MEDICAL HISTORY: Patient is already receiving daily antibiotics, 2 mg Versed, 100 mcg fentanyl, 25 mg Demerol  ANESTHESIA/SEDATION: 12 min  CONTRAST:  58mL OMNIPAQUE IOHEXOL 300 MG/ML  SOLN  FLUOROSCOPY TIME:  36 seconds  PROCEDURE: Informed consent was obtained from the patient following explanation of the procedure, risks, benefits and alternatives. The patient understands, agrees and consents for the procedure. All questions were addressed. A time out was performed.  Maximal barrier sterile technique utilized including caps, mask, sterile gowns, sterile gloves, large sterile drape, hand hygiene, and Betadine.  Previous imaging reviewed. Preliminary ultrasound performed in the right upper quadrant. Abnormal gallbladder with wall thickening and small stones demonstrated. There is a percutaneous transhepatic window noted beneath the right subcostal margin. Under sterile conditions and local anesthesia, a 21 gauge access needle was advanced percutaneously into the abnormal gallbladder. Needle position confirmed with ultrasound. Bile was aspirated. Sample sent for Gram [name] and culture. Guidewire inserted followed by Accustick dilator set. Guidewire exchanged for an Amplatz guidewire. Tract dilatation performed to inserted 10 [place] drain. Retention loop formed in the gallbladder. Position confirmed with fluoroscopy and a contrast injection. Images obtained for documentation. Catheter secured with the Prolene suture and connected to external drainage. Sterile dressing applied.  COMPLICATIONS: No immediate  IMPRESSION: Successful ultrasound and fluoroscopic percutaneous transhepatic cholecystostomy.   Electronically Signed   By: Daryll Brod M.D.   On: 07/17/2013 09:27    CBC  Recent Labs Lab 07/15/13 1719 07/16/13 0525 07/17/13 0415 07/18/13 0415  WBC 18.6* 15.6* 12.8* 6.4  HGB 14.6  13.7 12.9* 12.3*  HCT 43.0 40.2 38.8* 37.2*  PLT 140* 124* 140* 136*  MCV 89.2 87.2 89.8 89.9  MCH 30.3 29.7 29.9 29.7  MCHC 34.0 34.1 33.2 33.1  RDW 14.4 14.4 14.4 14.3  LYMPHSABS 0.8  --   --   --   MONOABS 2.1*  --   --   --   EOSABS 0.0  --   --   --   BASOSABS 0.0  --   --   --     Chemistries   Recent Labs Lab 07/15/13 1719 07/16/13 0525 07/17/13 0415 07/18/13 0415  NA 141 142 143 139  K 3.9 3.6* 3.8 3.5*  CL 99 102 103 101  CO2 28 26 28 25   GLUCOSE 118* 113* 113* 125*  BUN 24* 24* 25* 15  CREATININE 1.44* 1.21 1.31 0.94  CALCIUM 9.0 9.0 9.0 8.5  AST 20 15 13 13   ALT 47 36 26 23  ALKPHOS 68 75 71 60  BILITOT 2.4* 1.9* 1.2 0.9   ------------------------------------------------------------------------------------------------------------------ estimated creatinine clearance is 72.3 ml/min (by C-G formula based on Cr of 0.94). ------------------------------------------------------------------------------------------------------------------ No results found for this basename: HGBA1C,  in the last 72 hours ------------------------------------------------------------------------------------------------------------------ No results found for this basename: CHOL, HDL, LDLCALC, TRIG, CHOLHDL, LDLDIRECT,  in the last 72 hours ------------------------------------------------------------------------------------------------------------------ No results found for this basename: TSH, T4TOTAL, FREET3, T3FREE, THYROIDAB,  in the last 72 hours ------------------------------------------------------------------------------------------------------------------ No results found for this basename: VITAMINB12, FOLATE, FERRITIN, TIBC, IRON, RETICCTPCT,  in the last 72 hours  Coagulation profile  Recent Labs Lab 07/16/13 0910  INR 1.28    No results found for this basename: DDIMER,  in the last 72 hours  Cardiac Enzymes No results found for this basename: CK, CKMB, TROPONINI,  MYOGLOBIN,  in the last 168 hours ------------------------------------------------------------------------------------------------------------------ No components found with this basename: POCBNP,     Karrina Lye D.O. on 07/18/2013 at 12:06 PM  Between 7am to 7pm - Pager - [phone]  After 7pm go to [email or web address] - password [email or web address]  And look for the night coverage person covering for me after hours  [facility] Office  [phone]

## 2013-07-18 NOTE — Progress Notes (Signed)
Subjective: Pt feeling a little better this am; has some soreness at GB drain site; denies N/V  Objective: Vital signs in last 24 hours: Temp:  [97.5 F (36.4 C)-98.1 F (36.7 C)] 98.1 F (36.7 C) (01/15 0520) Pulse Rate:  [70-118] 70 (01/15 0520) Resp:  [16-17] 16 (01/15 0520) BP: (126-136)/(57-63) 127/59 mmHg (01/15 0520) SpO2:  [93 %-97 %] 96 % (01/15 0520) Last BM Date: 07/17/13  Intake/Output from previous day: 01/14 0701 - 01/15 0700 In: 1315 [I.V.:1150; IV Piggyback:150] Out: 365 [Urine:300; Drains:65] Intake/Output this shift:    GB drain intact, insertion site ok, mildly tender, output 65 cc's dark bile this am; cx's pending  Lab Results:   Recent Labs  07/17/13 0415 07/18/13 0415  WBC 12.8* 6.4  HGB 12.9* 12.3*  HCT 38.8* 37.2*  PLT 140* 136*   BMET  Recent Labs  07/17/13 0415 07/18/13 0415  NA 143 139  K 3.8 3.5*  CL 103 101  CO2 28 25  GLUCOSE 113* 125*  BUN 25* 15  CREATININE 1.31 0.94  CALCIUM 9.0 8.5   PT/INR  Recent Labs  07/16/13 0910  LABPROT 15.7*  INR 1.28   ABG No results found for this basename: PHART, PCO2, PO2, HCO3,  in the last 72 hours  Studies/Results: Dg Chest 1 View  07/17/2013   CLINICAL DATA:  Shortness of breath, wheezing.  EXAM: CHEST - 1 VIEW  COMPARISON:  07/15/2013  FINDINGS: Lungs clear. Heart size upper limits normal. Mild elevation of right diaphragmatic leaflet as before. . No effusion. Visualized skeletal structures are unremarkable.  IMPRESSION: No acute cardiopulmonary disease.   Electronically Signed   By: Arne Cleveland M.D.   On: 07/17/2013 20:04   Nm Hepatobiliary Liver Func  07/16/2013   CLINICAL DATA:  Acute cholecystitis suspected. Right upper quadrant abdominal pain and nausea.  EXAM: NUCLEAR MEDICINE HEPATOBILIARY IMAGING  TECHNIQUE: Sequential images of the abdomen were obtained out to 60 minutes following intravenous administration of radiopharmaceutical.  COMPARISON:  Abdominal ultrasound,  07/15/2013.  RADIOPHARMACEUTICALS:  19mCi Tc-86m Choletec  FINDINGS: There is prompt and homogeneous accumulation of radiotracer by the liver with prompt excretion into the intra and extrahepatic biliary tree. Small bowel activity was seen early during the examination. This indicates patency of the common bile duct.  3.5 mg of morphine was injected to promote gallbladder filling. Despite morphine, no gallbladder filling was noted during the examination, consistent with an obstructed cystic duct.  IMPRESSION: 1. Nonvisualization of the gallbladder consistent with an obstructed cystic duct an acute cholecystitis. 2. Normal liver.  Patent common bile duct.   Electronically Signed   By: Lajean Manes M.D.   On: 07/16/2013 16:19   Ir Perc Cholecystostomy  07/17/2013   CLINICAL DATA:  Acute cholecystitis  EXAM: ULTRASOUND GUIDANCE FOR ACCESS  PERCUTANEOUS TRANSHEPATIC CHOLECYSTOSTOMY  Date:  1/14/20151/14/2015 9:01 AM  Radiologist:  Jerilynn Mages. Daryll Brod, MD  Guidance:  Ultrasound and fluoroscopic  MEDICATIONS AND MEDICAL HISTORY: Patient is already receiving daily antibiotics, 2 mg Versed, 100 mcg fentanyl, 25 mg Demerol  ANESTHESIA/SEDATION: 12 min  CONTRAST:  56mL OMNIPAQUE IOHEXOL 300 MG/ML  SOLN  FLUOROSCOPY TIME:  36 seconds  PROCEDURE: Informed consent was obtained from the patient following explanation of the procedure, risks, benefits and alternatives. The patient understands, agrees and consents for the procedure. All questions were addressed. A time out was performed.  Maximal barrier sterile technique utilized including caps, mask, sterile gowns, sterile gloves, large sterile drape, hand hygiene, and Betadine.  Previous imaging reviewed. Preliminary ultrasound performed in the right upper quadrant. Abnormal gallbladder with wall thickening and small stones demonstrated. There is a percutaneous transhepatic window noted beneath the right subcostal margin. Under sterile conditions and local anesthesia, a 21 gauge  access needle was advanced percutaneously into the abnormal gallbladder. Needle position confirmed with ultrasound. Bile was aspirated. Sample sent for Gram stain and culture. Guidewire inserted followed by Accustick dilator set. Guidewire exchanged for an Amplatz guidewire. Tract dilatation performed to inserted 10 [place] drain. Retention loop formed in the gallbladder. Position confirmed with fluoroscopy and a contrast injection. Images obtained for documentation. Catheter secured with the Prolene suture and connected to external drainage. Sterile dressing applied.  COMPLICATIONS: No immediate  IMPRESSION: Successful ultrasound and fluoroscopic percutaneous transhepatic cholecystostomy.   Electronically Signed   By: Daryll Brod M.D.   On: 07/17/2013 09:27    Anti-infectives: Anti-infectives   Start     Dose/Rate Route Frequency Ordered Stop   07/16/13 0100  piperacillin-tazobactam (ZOSYN) IVPB 3.375 g     3.375 g 12.5 mL/hr over 240 Minutes Intravenous Every 8 hours 07/15/13 2118     07/15/13 1900  piperacillin-tazobactam (ZOSYN) IVPB 3.375 g     3.375 g 100 mL/hr over 30 Minutes Intravenous  Once 07/15/13 1846 07/15/13 2000      Assessment/Plan: S/p perc cholecystostomy 1/14; monitor labs, check final bile cx's; cont drain irrigation, other plans as per CCS/IM; drain will need to remain in place at least 4-6 weeks to allow for tract maturation unless surgery done in interim.  LOS: 3 days    Trany Chernick,D [facility] 07/18/2013

## 2013-07-18 NOTE — Progress Notes (Signed)
[facility]/[name] Progress Note    Subjective: Patient feels and looks a lot better.  Much less pain.  Objective: Vital signs in last 24 hours: Temp:  [97.5 F (36.4 C)-98.1 F (36.7 C)] 98.1 F (36.7 C) (01/15 0520) Pulse Rate:  [70-118] 70 (01/15 0520) Resp:  [16] 16 (01/15 0520) BP: (126-136)/(57-61) 127/59 mmHg (01/15 0520) SpO2:  [95 %-97 %] 96 % (01/15 0520) Last BM Date: 07/17/13  Intake/Output from previous day: 01/14 0701 - 01/15 0700 In: 1315 [I.V.:1150; IV Piggyback:150] Out: 365 [Urine:300; Drains:65] Intake/Output this shift:    General: No acute distress  Lungs: Clear to auscultation  Abd: Soft, excellent bowel sounds.  Not much brownish drainage from  Percutaneous biliary drain (65cc)  Extremities: No clinical signs or symptoms of DVT  Neuro: Intact  Lab Results:  @LABLAST2 (wbc:2,hgb:2,hct:2,plt:2) BMET  Recent Labs  07/17/13 0415 07/18/13 0415  NA 143 139  K 3.8 3.5*  CL 103 101  CO2 28 25  GLUCOSE 113* 125*  BUN 25* 15  CREATININE 1.31 0.94  CALCIUM 9.0 8.5   PT/INR  Recent Labs  07/16/13 0910  LABPROT 15.7*  INR 1.28   ABG No results found for this basename: PHART, PCO2, PO2, HCO3,  in the last 72 hours  Studies/Results: Dg Chest 1 View  07/17/2013   CLINICAL DATA:  Shortness of breath, wheezing.  EXAM: CHEST - 1 VIEW  COMPARISON:  07/15/2013  FINDINGS: Lungs clear. Heart size upper limits normal. Mild elevation of right diaphragmatic leaflet as before. . No effusion. Visualized skeletal structures are unremarkable.  IMPRESSION: No acute cardiopulmonary disease.   Electronically Signed   By: Arne Cleveland M.D.   On: 07/17/2013 20:04   Nm Hepatobiliary Liver Func  07/16/2013   CLINICAL DATA:  Acute cholecystitis suspected. Right upper quadrant abdominal pain and nausea.  EXAM: NUCLEAR MEDICINE HEPATOBILIARY IMAGING  TECHNIQUE: Sequential images of the abdomen were obtained out to 60 minutes following intravenous administration of  radiopharmaceutical.  COMPARISON:  Abdominal ultrasound, 07/15/2013.  RADIOPHARMACEUTICALS:  37mCi Tc-75m Choletec  FINDINGS: There is prompt and homogeneous accumulation of radiotracer by the liver with prompt excretion into the intra and extrahepatic biliary tree. Small bowel activity was seen early during the examination. This indicates patency of the common bile duct.  3.5 mg of morphine was injected to promote gallbladder filling. Despite morphine, no gallbladder filling was noted during the examination, consistent with an obstructed cystic duct.  IMPRESSION: 1. Nonvisualization of the gallbladder consistent with an obstructed cystic duct an acute cholecystitis. 2. Normal liver.  Patent common bile duct.   Electronically Signed   By: Lajean Manes M.D.   On: 07/16/2013 16:19   Ir Perc Cholecystostomy  07/17/2013   CLINICAL DATA:  Acute cholecystitis  EXAM: ULTRASOUND GUIDANCE FOR ACCESS  PERCUTANEOUS TRANSHEPATIC CHOLECYSTOSTOMY  Date:  1/14/20151/14/2015 9:01 AM  Radiologist:  Jerilynn Mages. Daryll Brod, MD  Guidance:  Ultrasound and fluoroscopic  MEDICATIONS AND MEDICAL HISTORY: Patient is already receiving daily antibiotics, 2 mg Versed, 100 mcg fentanyl, 25 mg Demerol  ANESTHESIA/SEDATION: 12 min  CONTRAST:  78mL OMNIPAQUE IOHEXOL 300 MG/ML  SOLN  FLUOROSCOPY TIME:  36 seconds  PROCEDURE: Informed consent was obtained from the patient following explanation of the procedure, risks, benefits and alternatives. The patient understands, agrees and consents for the procedure. All questions were addressed. A time out was performed.  Maximal barrier sterile technique utilized including caps, mask, sterile gowns, sterile gloves, large sterile drape, hand hygiene, and Betadine.  Previous imaging reviewed.  Preliminary ultrasound performed in the right upper quadrant. Abnormal gallbladder with wall thickening and small stones demonstrated. There is a percutaneous transhepatic window noted beneath the right subcostal margin.  Under sterile conditions and local anesthesia, a 21 gauge access needle was advanced percutaneously into the abnormal gallbladder. Needle position confirmed with ultrasound. Bile was aspirated. Sample sent for Gram stain and culture. Guidewire inserted followed by Accustick dilator set. Guidewire exchanged for an Amplatz guidewire. Tract dilatation performed to inserted 10 [place] drain. Retention loop formed in the gallbladder. Position confirmed with fluoroscopy and a contrast injection. Images obtained for documentation. Catheter secured with the Prolene suture and connected to external drainage. Sterile dressing applied.  COMPLICATIONS: No immediate  IMPRESSION: Successful ultrasound and fluoroscopic percutaneous transhepatic cholecystostomy.   Electronically Signed   By: Daryll Brod M.D.   On: 07/17/2013 09:27    Anti-infectives: Anti-infectives   Start     Dose/Rate Route Frequency Ordered Stop   07/16/13 0100  piperacillin-tazobactam (ZOSYN) IVPB 3.375 g     3.375 g 12.5 mL/hr over 240 Minutes Intravenous Every 8 hours 07/15/13 2118     07/15/13 1900  piperacillin-tazobactam (ZOSYN) IVPB 3.375 g     3.375 g 100 mL/hr over 30 Minutes Intravenous  Once 07/15/13 1846 07/15/13 2000      Assessment/Plan: s/p  Advance diet Continue ABX therapy due to Post-op infection/infection of gallbladder  LOS: 3 days   Kathryne Eriksson. Dahlia Bailiff, MD, FACS [phone] [facility] Surgery 07/18/2013

## 2013-07-19 LAB — COMPREHENSIVE METABOLIC PANEL
ALBUMIN: 2.6 g/dL — AB (ref 3.5–5.2)
ALK PHOS: 59 U/L (ref 39–117)
ALT: 20 U/L (ref 0–53)
AST: 13 U/L (ref 0–37)
BILIRUBIN TOTAL: 0.5 mg/dL (ref 0.3–1.2)
BUN: 11 mg/dL (ref 6–23)
CHLORIDE: 102 meq/L (ref 96–112)
CO2: 25 meq/L (ref 19–32)
Calcium: 8.5 mg/dL (ref 8.4–10.5)
Creatinine, Ser: 0.92 mg/dL (ref 0.50–1.35)
GFR calc Af Amer: >90 mL/min (ref >90)
GFR calc non Af Amer: 82 mL/min — ABNORMAL LOW (ref >90)
Glucose, Bld: 122 mg/dL — ABNORMAL HIGH (ref 70–99)
POTASSIUM: 3.6 meq/L — AB (ref 3.7–5.3)
SODIUM: 140 meq/L (ref 137–147)
Total Protein: 6.1 g/dL (ref 6.0–8.3)

## 2013-07-19 LAB — CBC
HCT: 38 % — ABNORMAL LOW (ref 39.0–52.0)
Hemoglobin: 12.5 g/dL — ABNORMAL LOW (ref 13.0–17.0)
MCH: 29.5 pg (ref 26.0–34.0)
MCHC: 32.9 g/dL (ref 30.0–36.0)
MCV: 89.6 fL (ref 78.0–100.0)
PLATELETS: 147 10*3/uL — AB (ref 150–400)
RBC: 4.24 MIL/uL (ref 4.22–5.81)
RDW: 14 % (ref 11.5–15.5)
WBC: 6.7 10*3/uL (ref 4.0–10.5)

## 2013-07-19 MED ORDER — PANTOPRAZOLE SODIUM 40 MG PO TBEC: 40.0000 mg | DELAYED_RELEASE_TABLET | Freq: Every day | ORAL | Status: DC

## 2013-07-19 MED ORDER — POTASSIUM CHLORIDE CRYS ER 20 MEQ PO TBCR
40.0000 meq | EXTENDED_RELEASE_TABLET | Freq: Once | ORAL | Status: AC
  Administered 2013-07-19: 40 meq via ORAL
  Filled 2013-07-19: qty 2

## 2013-07-19 MED ORDER — AMOXICILLIN-POT CLAVULANATE 875-125 MG PO TABS: 1.0000 | ORAL_TABLET | Freq: Two times a day (BID) | ORAL | Status: DC

## 2013-07-19 MED ORDER — ASPIRIN 325 MG PO TBEC: 325.0000 mg | DELAYED_RELEASE_TABLET | Freq: Every day | ORAL | Status: DC

## 2013-07-19 NOTE — Progress Notes (Signed)
  Subjective: Abdominal pain much better, tolerating PO  Objective: Vital signs in last 24 hours: Temp:  [98.3 F (36.8 C)-98.4 F (36.9 C)] 98.3 F (36.8 C) (01/16 0509) Pulse Rate:  [65-81] 65 (01/16 0509) Resp:  [16-20] 20 (01/16 0509) BP: (128-138)/(53-61) 131/61 mmHg (01/16 0509) SpO2:  [93 %-97 %] 94 % (01/16 0509) Last BM Date: 07/18/13  Intake/Output from previous day: 01/15 0701 - 01/16 0700 In: 165 [IV Piggyback:150] Out: 425 [Urine:300; Drains:125] Intake/Output this shift:    General appearance: alert and cooperative Resp: clear to auscultation bilaterally GI: soft, NT, perc chole drain in place and draining blood tinged bile Neurologic: Mental status: Alert, oriented, thought content appropriate  Lab Results:   Recent Labs  07/18/13 0415 07/19/13 0250  WBC 6.4 6.7  HGB 12.3* 12.5*  HCT 37.2* 38.0*  PLT 136* 147*   BMET  Recent Labs  07/18/13 0415 07/19/13 0250  NA 139 140  K 3.5* 3.6*  CL 101 102  CO2 25 25  GLUCOSE 125* 122*  BUN 15 11  CREATININE 0.94 0.92  CALCIUM 8.5 8.5   PT/INR No results found for this basename: LABPROT, INR,  in the last 72 hours ABG No results found for this basename: PHART, PCO2, PO2, HCO3,  in the last 72 hours  Studies/Results: Dg Chest 1 View  07/17/2013   CLINICAL DATA:  Shortness of breath, wheezing.  EXAM: CHEST - 1 VIEW  COMPARISON:  07/15/2013  FINDINGS: Lungs clear. Heart size upper limits normal. Mild elevation of right diaphragmatic leaflet as before. . No effusion. Visualized skeletal structures are unremarkable.  IMPRESSION: No acute cardiopulmonary disease.   Electronically Signed   By: Arne Cleveland M.D.   On: 07/17/2013 20:04    Anti-infectives: Anti-infectives   Start     Dose/Rate Route Frequency Ordered Stop   07/16/13 0100  piperacillin-tazobactam (ZOSYN) IVPB 3.375 g     3.375 g 12.5 mL/hr over 240 Minutes Intravenous Every 8 hours 07/15/13 2118     07/15/13 1900   piperacillin-tazobactam (ZOSYN) IVPB 3.375 g     3.375 g 100 mL/hr over 30 Minutes Intravenous  Once 07/15/13 1846 07/15/13 2000      Assessment/Plan: Cholecystitis - S/P perc drain. F/U with Dr. Brantley Stage at [place] in 3 weeks. OK to D/C from our standpoint. Recommend 7d oral ABX (augmentin or Cipro)  LOS: 4 days    Adreanna Fickel E 07/19/2013

## 2013-07-19 NOTE — Progress Notes (Signed)
Subjective: Perc Chole drain placed 1/14 in IR Pt has done well Home today  Objective: Vital signs in last 24 hours: Temp:  [98.3 F (36.8 C)-98.4 F (36.9 C)] 98.3 F (36.8 C) (01/16 0509) Pulse Rate:  [65-81] 65 (01/16 0509) Resp:  [16-20] 20 (01/16 0509) BP: (128-138)/(53-61) 131/61 mmHg (01/16 0509) SpO2:  [93 %-97 %] 94 % (01/16 0509) Last BM Date: 07/18/13  Intake/Output from previous day: 01/15 0701 - 01/16 0700 In: 165 [IV Piggyback:150] Out: 425 [Urine:300; Drains:125] Intake/Output this shift:    PE:  Afeb; vss Site of drain clean and dry Intact No bleeding Output 125 cc yesterday; 30 cc in bag now Output bloody bile Wbc wnl Cx: no growth   Lab Results:   Recent Labs  07/18/13 0415 07/19/13 0250  WBC 6.4 6.7  HGB 12.3* 12.5*  HCT 37.2* 38.0*  PLT 136* 147*   BMET  Recent Labs  07/18/13 0415 07/19/13 0250  NA 139 140  K 3.5* 3.6*  CL 101 102  CO2 25 25  GLUCOSE 125* 122*  BUN 15 11  CREATININE 0.94 0.92  CALCIUM 8.5 8.5   PT/INR  Recent Labs  07/16/13 0910  LABPROT 15.7*  INR 1.28   ABG No results found for this basename: PHART, PCO2, PO2, HCO3,  in the last 72 hours  Studies/Results: Dg Chest 1 View  07/17/2013   CLINICAL DATA:  Shortness of breath, wheezing.  EXAM: CHEST - 1 VIEW  COMPARISON:  07/15/2013  FINDINGS: Lungs clear. Heart size upper limits normal. Mild elevation of right diaphragmatic leaflet as before. . No effusion. Visualized skeletal structures are unremarkable.  IMPRESSION: No acute cardiopulmonary disease.   Electronically Signed   By: Arne Cleveland M.D.   On: 07/17/2013 20:04   Ir Perc Cholecystostomy  07/17/2013   CLINICAL DATA:  Acute cholecystitis  EXAM: ULTRASOUND GUIDANCE FOR ACCESS  PERCUTANEOUS TRANSHEPATIC CHOLECYSTOSTOMY  Date:  1/14/20151/14/2015 9:01 AM  Radiologist:  Jerilynn Mages. Daryll Brod, MD  Guidance:  Ultrasound and fluoroscopic  MEDICATIONS AND MEDICAL HISTORY: Patient is already receiving daily  antibiotics, 2 mg Versed, 100 mcg fentanyl, 25 mg Demerol  ANESTHESIA/SEDATION: 12 min  CONTRAST:  66mL OMNIPAQUE IOHEXOL 300 MG/ML  SOLN  FLUOROSCOPY TIME:  36 seconds  PROCEDURE: Informed consent was obtained from the patient following explanation of the procedure, risks, benefits and alternatives. The patient understands, agrees and consents for the procedure. All questions were addressed. A time out was performed.  Maximal barrier sterile technique utilized including caps, mask, sterile gowns, sterile gloves, large sterile drape, hand hygiene, and Betadine.  Previous imaging reviewed. Preliminary ultrasound performed in the right upper quadrant. Abnormal gallbladder with wall thickening and small stones demonstrated. There is a percutaneous transhepatic window noted beneath the right subcostal margin. Under sterile conditions and local anesthesia, a 21 gauge access needle was advanced percutaneously into the abnormal gallbladder. Needle position confirmed with ultrasound. Bile was aspirated. Sample sent for Gram stain and culture. Guidewire inserted followed by Accustick dilator set. Guidewire exchanged for an Amplatz guidewire. Tract dilatation performed to inserted 10 [place] drain. Retention loop formed in the gallbladder. Position confirmed with fluoroscopy and a contrast injection. Images obtained for documentation. Catheter secured with the Prolene suture and connected to external drainage. Sterile dressing applied.  COMPLICATIONS: No immediate  IMPRESSION: Successful ultrasound and fluoroscopic percutaneous transhepatic cholecystostomy.   Electronically Signed   By: Daryll Brod M.D.   On: 07/17/2013 09:27    Anti-infectives: Anti-infectives   Start  Dose/Rate Route Frequency Ordered Stop   07/16/13 0100  piperacillin-tazobactam (ZOSYN) IVPB 3.375 g     3.375 g 12.5 mL/hr over 240 Minutes Intravenous Every 8 hours 07/15/13 2118     07/15/13 1900  piperacillin-tazobactam (ZOSYN) IVPB 3.375 g      3.375 g 100 mL/hr over 30 Minutes Intravenous  Once 07/15/13 1846 07/15/13 2000      Assessment/Plan: s/p * No surgery found *  Perc chole intact Will stay in for 6-8 weeks or until [place] per [facility]   LOS: 4 days    [name] A 07/19/2013

## 2013-07-19 NOTE — Discharge Summary (Signed)
Physician Discharge Summary  [name] NLG:[id number] DOB: 1941-01-13 DOA: 07/15/2013  PCP: [name], MD  Admit date: 07/15/2013 Discharge date: 07/19/2013  Time spent: 35 minutes  Recommendations for Outpatient Follow-up:  Patient will be discharged to home. He will need followup with his primary care physician within one week of discharge. Dr. Brantley Stage office will be contacting the patient for followup. Patient will be discharged with his drain this will need to stay in place until his surgery. He should continue his medications as prescribed. Patient should follow a fat restricted diet.   Discharge Diagnoses:  Acute cholecystitis, status post drain placement History of PAD, stable Hyperlipidemia, stable GERD, stable  BPH, stable  Hypertension, stable  Discharge Condition: Stable  Diet recommendation: Low fat diet  Filed Weights   07/15/13 2309  Weight: 86.592 kg (190 lb 14.4 oz)    History of present illness:  [name] is a 73 y.o. male, with past medical history significant for hypertension , hyperlipidemia, and peripheral vascular disease status post stent placement on Plavix , presenting with a severe history of the right upper quadrant pain with nausea vomiting and diarrhea, no reports of fever but no chills. There were reports of shortness of breath, no chest pains, no cough. The ultrasound in the emergency room showed cholecystitis and surgery was consulted.  Hospital Course:  Acute cholecystitis  -General surgery and IR consulted  -Patient had drain placement by IR, which will need to remain for 4-6 weeks  -Leukocytosis trending downward  -Was initially placed on empiric IV antibiotics and IV fluids  -Patient was able to tolerate a diet and was advanced as tolerated, will need to continue a low-fat diet. -Per surgery, will need to arthroscopic cholecystectomy in 8 weeks once he is improved.   -Patient will need to follow Dr. Brantley Stage. -Patient will be  discharged with Augmentin for 7 days.  History PAD  -Stable, status post stent placement by Dr. Bridgett Larsson 2 years ago  -Continue Plavix, aspirin, statin  Hyperlipidemia  -Continue statin  GERD  -Continue omeprazole   BPH  -Continue Proscar and Flomax   Hypertension  -Currently controlled  Procedures: Percutaneous cholecystotomy  Consultations: Surgery Radiology  Discharge Exam: Filed Vitals:   07/19/13 0509  BP: 131/61  Pulse: 65  Temp: 98.3 F (36.8 C)  Resp: 20   Exam  General: Well developed, well nourished, NAD, appears stated age  HEENT: NCAT, PERRLA, EOMI, Anicteic Sclera, mucous membranes moist.  Neck: Supple, no JVD, no masses  Cardiovascular: S1 S2 auscultated, no rubs, murmurs or gallops. Regular rate and rhythm.  Respiratory: Clear to auscultation bilaterally with equal chest rise  Abdomen: Soft, RUQ tenderness, nondistended, + bowel sounds, drain placed.  Extremities: warm dry without cyanosis clubbing or edema  Neuro: AAOx3, cranial nerves grossly intact.  Skin: Without rashes exudates or nodules  Psych: Normal affect and demeanor with intact judgement and insight  Discharge Instructions      Discharge Orders   Future Appointments Provider Department Dept Phone   09/19/2013 9:00 AM [facility] [phone]   Eat a light meal the night before the exam. Nothing to eat or drink for at least 8 hours before exam. No gum chewing, or smoking the morning of the exam. Please take your morning medications with small sips of water, especially blood pressure medication *Very Important* Please wear 2 piece clothing   09/19/2013 10:00 AM [facility] [phone]  09/19/2013 10:40 AM Sharmon Leyden Nickel, NP [facility] [phone]   Future Orders Complete By Expires   Discharge instructions  As directed    Comments:     Patient will be discharged to home. He  will need followup with his primary care physician within one week of discharge. Dr. Brantley Stage office will be contacting the patient for followup. Patient will be discharged with his drain this will need to stay in place until his surgery. He should continue his medications as prescribed. Patient should follow a fat restricted diet.   Increase activity slowly  As directed        Medication List         albuterol 108 (90 BASE) MCG/ACT inhaler  Commonly known as:  PROVENTIL HFA;VENTOLIN HFA  Inhale 1-2 puffs into the lungs every 6 (six) hours as needed for wheezing.     amoxicillin-clavulanate 875-125 MG per tablet  Commonly known as:  AUGMENTIN  Take 1 tablet by mouth 2 (two) times daily.     aspirin 325 MG EC tablet  Take 1 tablet (325 mg total) by mouth daily.     clopidogrel 75 MG tablet  Commonly known as:  PLAVIX  Take 75 mg by mouth every evening.     diltiazem 120 MG tablet  Commonly known as:  CARDIZEM  Take 120 mg by mouth 2 (two) times daily.     finasteride 5 MG tablet  Commonly known as:  PROSCAR  Take 5 mg by mouth every evening.     omeprazole 20 MG capsule  Commonly known as:  PRILOSEC  Take 20 mg by mouth every morning.     pravastatin 40 MG tablet  Commonly known as:  PRAVACHOL  Take 20 mg by mouth every evening. Takes half tab daily     tamsulosin 0.4 MG Caps capsule  Commonly known as:  FLOMAX  Take 0.4 mg by mouth daily after supper.       Allergies  Allergen Reactions  . Hydrochlorothiazide Other (See Comments)    REACTION: joint pain  . Benazepril Hcl Other (See Comments)    unknown   Follow-up Information   Follow up with CORNETT,THOMAS A., MD. Schedule an appointment as soon as possible for a visit in 3 weeks. Sharyn Lull, Dr. Josetta Huddle nurse will call you with your appointment date and time)    Specialty:  General Surgery   Contact information:   [place] [address] [phone]       Follow up with [name], MD. Schedule an appointment as soon as possible for a visit in 1 week.   Specialty:  Family Medicine   Contact information:   [place] [address] [phone]        The results of significant diagnostics from this hospitalization (including imaging, microbiology, ancillary and laboratory) are listed below for reference.    Significant Diagnostic Studies: Dg Chest 1 View  07/17/2013   CLINICAL DATA:  Shortness of breath, wheezing.  EXAM: CHEST - 1 VIEW  COMPARISON:  07/15/2013  FINDINGS: Lungs clear. Heart size upper limits normal. Mild elevation of right diaphragmatic leaflet as before. . No effusion. Visualized skeletal structures are unremarkable.  IMPRESSION: No acute cardiopulmonary disease.   Electronically Signed   By: Arne Cleveland M.D.   On: 07/17/2013 20:04   Dg Chest 2 View  07/15/2013   CLINICAL DATA:  Fever, vomiting, abdominal pain.  EXAM: CHEST  2 VIEW  COMPARISON:  06/21/2012  FINDINGS: There is marked dilatation of the right hemidiaphragm which is a new finding. There is associated atelectasis at the right base. Left lung is grossly clear. Normal heart size. No pneumothorax.  IMPRESSION: New elevation of the right hemidiaphragm. Differential diagnosis includes subpulmonic pleural effusion and splinting of the right hemidiaphragm secondary to an abdominal inflammatory process. There is associated atelectasis at the right lung base   Electronically Signed   By: Maryclare Bean M.D.   On: 07/15/2013 21:37   Nm Hepatobiliary Liver Func  07/16/2013   CLINICAL DATA:  Acute cholecystitis suspected. Right upper quadrant abdominal pain and nausea.  EXAM: NUCLEAR MEDICINE HEPATOBILIARY IMAGING  TECHNIQUE: Sequential images of the abdomen were obtained out to 60 minutes following intravenous administration of radiopharmaceutical.  COMPARISON:  Abdominal ultrasound, 07/15/2013.  RADIOPHARMACEUTICALS:  44mCi Tc-39m Choletec  FINDINGS: There is prompt and  homogeneous accumulation of radiotracer by the liver with prompt excretion into the intra and extrahepatic biliary tree. Small bowel activity was seen early during the examination. This indicates patency of the common bile duct.  3.5 mg of morphine was injected to promote gallbladder filling. Despite morphine, no gallbladder filling was noted during the examination, consistent with an obstructed cystic duct.  IMPRESSION: 1. Nonvisualization of the gallbladder consistent with an obstructed cystic duct an acute cholecystitis. 2. Normal liver.  Patent common bile duct.   Electronically Signed   By: Lajean Manes M.D.   On: 07/16/2013 16:19   US Abdomen Limited  07/15/2013   CLINICAL DATA:  73 year old with a right upper quadrant abdominal pain.  EXAM: US ABDOMEN LIMITED - RIGHT UPPER QUADRANT  COMPARISON:  CT abdomen and pelvis 02/05/2012, 03/08/2010, 01/09/2010.  FINDINGS: Gallbladder  Echogenic sludge and numerous small shadowing gallstones. Mild gallbladder wall thickening up to 5 mm. No visible pericholecystic fluid. Negative sonographic Murphy sign according to the ultrasound technologist.  Common bile duct  Diameter: 3-6 mm.  No visible bile duct stones.  Liver:  Diffusely increased and coarsened echotexture without focal hepatic parenchymal abnormality. Patent portal vein with hepatopetal flow.  IMPRESSION: 1. Cholelithiasis and gallbladder sludge. Mild gallbladder wall thickening is consistent with cholecystitis which may be acute or chronic. 2. No biliary ductal dilation. 3. Diffuse hepatic steatosis and/or hepatocellular disease. No focal hepatic parenchymal abnormality.   Electronically Signed   By: Evangeline Dakin M.D.   On: 07/15/2013 18:43   Ir Perc Cholecystostomy  07/17/2013   CLINICAL DATA:  Acute cholecystitis  EXAM: ULTRASOUND GUIDANCE FOR ACCESS  PERCUTANEOUS TRANSHEPATIC CHOLECYSTOSTOMY  Date:  1/14/20151/14/2015 9:01 AM  Radiologist:  Jerilynn Mages. Daryll Brod, MD  Guidance:  Ultrasound and  fluoroscopic  MEDICATIONS AND MEDICAL HISTORY: Patient is already receiving daily antibiotics, 2 mg Versed, 100 mcg fentanyl, 25 mg Demerol  ANESTHESIA/SEDATION: 12 min  CONTRAST:  57mL OMNIPAQUE IOHEXOL 300 MG/ML  SOLN  FLUOROSCOPY TIME:  36 seconds  PROCEDURE: Informed consent was obtained from the patient following explanation of the procedure, risks, benefits and alternatives. The patient understands, agrees and consents for the procedure. All questions were addressed. A time out was performed.  Maximal barrier sterile technique utilized including caps, mask, sterile gowns, sterile gloves, large sterile drape, hand hygiene, and Betadine.  Previous imaging reviewed. Preliminary ultrasound performed in the right upper quadrant. Abnormal gallbladder with wall thickening and small stones demonstrated. There is a percutaneous transhepatic window noted beneath the right subcostal margin. Under sterile conditions and local anesthesia, a 21 gauge access needle was  advanced percutaneously into the abnormal gallbladder. Needle position confirmed with ultrasound. Bile was aspirated. Sample sent for Gram stain and culture. Guidewire inserted followed by Accustick dilator set. Guidewire exchanged for an Amplatz guidewire. Tract dilatation performed to inserted 10 [place] drain. Retention loop formed in the gallbladder. Position confirmed with fluoroscopy and a contrast injection. Images obtained for documentation. Catheter secured with the Prolene suture and connected to external drainage. Sterile dressing applied.  COMPLICATIONS: No immediate  IMPRESSION: Successful ultrasound and fluoroscopic percutaneous transhepatic cholecystostomy.   Electronically Signed   By: Daryll Brod M.D.   On: 07/17/2013 09:27    Microbiology: Recent Results (from the past 240 hour(s))  CULTURE, ROUTINE-ABSCESS     Status: None   Collection Time    07/17/13  9:35 AM      Result Value Range Status   Specimen Description ABSCESS GALL  BLADDER   Final   Special Requests NONE   Final   Gram Stain     Final   Value: NO WBC SEEN     NO SQUAMOUS EPITHELIAL CELLS SEEN     NO ORGANISMS SEEN     Performed at [facility]   Culture     Final   Value: NO GROWTH 2 DAYS     Performed at [facility]   Report Status PENDING   Incomplete  ANAEROBIC CULTURE     Status: None   Collection Time    07/17/13  9:35 AM      Result Value Range Status   Specimen Description ABSCESS GALL BLADDER   Final   Special Requests NONE   Final   Gram Stain     Final   Value: NO WBC SEEN     NO SQUAMOUS EPITHELIAL CELLS SEEN     NO ORGANISMS SEEN     Performed at [facility]   Culture     Final   Value: NO ANAEROBES ISOLATED; CULTURE IN PROGRESS FOR 5 DAYS     Performed at [facility]   Report Status PENDING   Incomplete     Labs: Basic Metabolic Panel:  Recent Labs Lab 07/15/13 1719 07/16/13 0525 07/17/13 0415 07/18/13 0415 07/19/13 0250  NA 141 142 143 139 140  K 3.9 3.6* 3.8 3.5* 3.6*  CL 99 102 103 101 102  CO2 28 26 28 25 25   GLUCOSE 118* 113* 113* 125* 122*  BUN 24* 24* 25* 15 11  CREATININE 1.44* 1.21 1.31 0.94 0.92  CALCIUM 9.0 9.0 9.0 8.5 8.5   Liver Function Tests:  Recent Labs Lab 07/15/13 1719 07/16/13 0525 07/17/13 0415 07/18/13 0415 07/19/13 0250  AST 20 15 13 13 13   ALT 47 36 26 23 20   ALKPHOS 68 75 71 60 59  BILITOT 2.4* 1.9* 1.2 0.9 0.5  PROT 6.9 6.7 6.5 6.1 6.1  ALBUMIN 3.3* 3.0* 2.8* 2.5* 2.6*    Recent Labs Lab 07/15/13 1719 07/16/13 0525  LIPASE 14 18  AMYLASE  --  18   No results found for this basename: AMMONIA,  in the last 168 hours CBC:  Recent Labs Lab 07/15/13 1719 07/16/13 0525 07/17/13 0415 07/18/13 0415 07/19/13 0250  WBC 18.6* 15.6* 12.8* 6.4 6.7  NEUTROABS 15.7*  --   --   --   --   HGB 14.6 13.7 12.9* 12.3* 12.5*  HCT 43.0 40.2 38.8* 37.2* 38.0*  MCV 89.2 87.2 89.8 89.9 89.6  PLT 140* 124* 140* 136* 147*   Cardiac  Enzymes: No  results found for this basename: CKTOTAL, CKMB, CKMBINDEX, TROPONINI,  in the last 168 hours BNP: BNP (last 3 results) No results found for this basename: PROBNP,  in the last 8760 hours CBG: No results found for this basename: GLUCAP,  in the last 168 hours     Signed:  Cristal Ford  [facility] 07/19/2013, 11:04 AM

## 2013-07-19 NOTE — Discharge Instructions (Signed)
Cholecystitis Cholecystitis is an inflammation of your gallbladder. It is usually caused by a buildup of gallstones or sludge (cholelithiasis) in your gallbladder. The gallbladder stores a fluid that helps digest fats (bile). Cholecystitis is serious and needs treatment right away.  CAUSES   Gallstones. Gallstones can block the tube that leads to your gallbladder, causing bile to build up. As bile builds up, the gallbladder becomes inflamed.  Bile duct problems, such as blockage from scarring or kinking.  Tumors. Tumors can stop bile from leaving your gallbladder correctly, causing bile to build up. As bile builds up, the gallbladder becomes inflamed. SYMPTOMS   Nausea.  Vomiting.  Abdominal pain, especially in the upper right area of your abdomen.  Abdominal tenderness or bloating.  Sweating.  Chills.  Fever.  Yellowing of the skin and the whites of the eyes (jaundice). DIAGNOSIS  Your caregiver may order blood tests to look for infection or gallbladder problems. Your caregiver may also order imaging tests, such as an ultrasound or computed tomography (CT) scan. Further tests may include a hepatobiliary iminodiacetic acid (HIDA) scan. This scan allows your caregiver to see your bile move from the liver to the gallbladder and to the small intestine. TREATMENT  A hospital stay is usually necessary to lessen the inflammation of your gallbladder. You may be required to not eat or drink (fast) for a certain amount of time. You may be given medicine to treat pain or an antibiotic medicine to treat an infection. Surgery may be needed to remove your gallbladder (cholecystectomy) once the inflammation has gone down. Surgery may be needed right away if you develop complications such as death of gallbladder tissue (gangrene) or a tear (perforation) of the gallbladder.  HOME CARE INSTRUCTIONS  Home care will depend on your treatment. In general:  If you were given antibiotics, take them as  directed. Finish them even if you start to feel better.  Only take over-the-counter or prescription medicines for pain, discomfort, or fever as directed by your caregiver.  Follow a low-fat diet until you see your caregiver again.  Keep all follow-up visits as directed by your caregiver. SEEK IMMEDIATE MEDICAL CARE IF:   Your pain is increasing and not controlled by medicines.  Your pain moves to another part of your abdomen or to your back.  You have a fever.  You have nausea and vomiting. MAKE SURE YOU:  Understand these instructions.  Will watch your condition.  Will get help right away if you are not doing well or get worse. Document Released: 06/20/2005 Document Revised: 09/12/2011 Document Reviewed: 05/06/2011 [employer] Patient Information 2014 [employer].  

## 2013-07-19 NOTE — Progress Notes (Signed)
DC HOME WITH WIFE, VERBALLY UNDERSTOOD DC INSTRUCTIONS, NO QUESTIONS ASKED, INSTRUCTED ON CARE OF BILIARY DRAIN.

## 2013-07-20 LAB — CULTURE, ROUTINE-ABSCESS
CULTURE: NO GROWTH
Gram Stain: NONE SEEN

## 2013-07-20 NOTE — ED Provider Notes (Signed)
CSN: [id number]     Arrival date & time 07/15/13  1651 History   First MD Initiated Contact with Patient 07/15/13 1659     Chief Complaint  Patient presents with  . Abdominal Pain  . Diarrhea   (Consider location/radiation/quality/duration/timing/severity/associated sxs/prior Treatment) HPI  73 year old male with abdominal pain. Onset about a week ago and progressively worsening. Associated with nausea and diarrhea. Anorexia. Patient was seen by his primary care provider and was told he is concerned that this may have a gallbladder issue. Subjective fever. No chills. No urinary complaints. Feels tired. No sick contacts.  Past Medical History  Diagnosis Date  . Hyperlipidemia   . Hypertension   . OAD (obstructive airway disease)   . GERD (gastroesophageal reflux disease)   . Diverticulosis   . ED (erectile dysfunction)     no relief with cialis, etc  . Back pain     per [facility]  . Thyroid nodule   . Anemia   . Irregular heartbeat   . Peripheral vascular disease   . Bronchitis   . AAA (abdominal aortic aneurysm)   . Emphysema     "has a little bit" (07/16/2013)  . Pneumonia     "twice since 1959; once when he was a child" (07/16/2013)  . Exertional shortness of breath   . DDD (degenerative disc disease)     cervical, followed by [facility] 2012   Past Surgical History  Procedure Laterality Date  . Small intestine surgery      "blockage" (07/16/2013)  . Skin graft Left     "hand"  . Tonsillectomy    . Colonoscopy w/ polypectomy    . Aortogram  Aug. 1, 2013  . Colectomy  1961  . Iliac artery stent Right 02/2012    "Dr. Bridgett Larsson"   Family History  Problem Relation Age of Onset  . Heart attack Mother   . Heart disease Mother   . Hyperlipidemia Mother   . Stroke Father   . Heart attack Father   . Heart disease Father   . Hyperlipidemia Father   . Hypertension Father   . Other Father     varicose veins  . Heart attack Brother   . Prostate cancer Brother   . Cancer Brother   .  Other Brother     varicose veins  . Heart attack Brother   . Heart disease Brother   . Hyperlipidemia Brother   . Other Brother     varicose veins  . Breast cancer Sister   . Diabetes Sister   . Cancer Sister   . Heart disease Brother   . Hyperlipidemia Brother   . Hypertension Brother   . Heart attack Brother    History  Substance Use Topics  . Smoking status: Former Smoker -- 3.00 packs/day for 30 years    Types: Cigarettes    Quit date: 02/28/1982  . Smokeless tobacco: Never Used  . Alcohol Use: 0.0 oz/week     Comment: 07/16/2013 "used to drink a couple beers/night; stopped  02/2012"    Review of Systems  All systems reviewed and negative, other than as noted in HPI.   Allergies  Hydrochlorothiazide and Benazepril hcl  Home Medications   Current Outpatient Rx  Name  Route  Sig  Dispense  Refill  . albuterol (PROVENTIL HFA;VENTOLIN HFA) 108 (90 BASE) MCG/ACT inhaler   Inhalation   Inhale 1-2 puffs into the lungs every 6 (six) hours as needed for wheezing.   1 Inhaler  5   . clopidogrel (PLAVIX) 75 MG tablet   Oral   Take 75 mg by mouth every evening.         . diltiazem (CARDIZEM) 120 MG tablet   Oral   Take 120 mg by mouth 2 (two) times daily.         . finasteride (PROSCAR) 5 MG tablet   Oral   Take 5 mg by mouth every evening.          Marland Kitchen omeprazole (PRILOSEC) 20 MG capsule   Oral   Take 20 mg by mouth every morning.         . pravastatin (PRAVACHOL) 40 MG tablet   Oral   Take 20 mg by mouth every evening. Takes half tab daily         . tamsulosin (FLOMAX) 0.4 MG CAPS capsule   Oral   Take 0.4 mg by mouth daily after supper.         Marland Kitchen amoxicillin-clavulanate (AUGMENTIN) 875-125 MG per tablet   Oral   Take 1 tablet by mouth 2 (two) times daily.   14 tablet   0   . aspirin EC 325 MG EC tablet   Oral   Take 1 tablet (325 mg total) by mouth daily.   30 tablet   0    BP 131/61  Pulse 65  Temp(Src) 98.3 F (36.8 C) (Oral)   Resp 20  Ht 5\' 10"  (1.778 m)  Wt 190 lb 14.4 oz (86.592 kg)  BMI 27.39 kg/m2  SpO2 94% Physical Exam  Nursing note and vitals reviewed. Constitutional: He appears well-developed and well-nourished. No distress.  HENT:  Head: Normocephalic and atraumatic.  Eyes: Conjunctivae are normal. Right eye exhibits no discharge. Left eye exhibits no discharge.  Neck: Neck supple.  Cardiovascular: Regular rhythm and normal heart sounds.  Exam reveals no gallop and no friction rub.   No murmur heard. Tachycardic  Pulmonary/Chest: Effort normal and breath sounds normal. No respiratory distress.  Abdominal: Soft. He exhibits no distension. There is tenderness.  Epigastric and right upper quadrant tenderness with voluntary guarding. No rebound. No distention.  Musculoskeletal: He exhibits no edema and no tenderness.  Neurological: He is alert.  Drowsy, but awakens to conversational voice. Report of hearing and seems to lose his train of thought easily. Otherwise neuro exam nonfocal.   Skin: Skin is warm and dry.  Psychiatric: He has a normal mood and affect. His behavior is normal. Thought content normal.    ED Course  Procedures (including critical care time) Labs Review Labs Reviewed  COMPREHENSIVE METABOLIC PANEL - Abnormal; Notable for the following:    Glucose, Bld 118 (*)    BUN 24 (*)    Creatinine, Ser 1.44 (*)    Albumin 3.3 (*)    Total Bilirubin 2.4 (*)    GFR calc non Af Amer 47 (*)    GFR calc Af Amer 54 (*)    All other components within normal limits  CBC WITH DIFFERENTIAL - Abnormal; Notable for the following:    WBC 18.6 (*)    Platelets 140 (*)    Neutrophils Relative % 84 (*)    Neutro Abs 15.7 (*)    Lymphocytes Relative 4 (*)    Monocytes Absolute 2.1 (*)    All other components within normal limits  URINALYSIS, ROUTINE W REFLEX MICROSCOPIC - Abnormal; Notable for the following:    Color, Urine AMBER (*)    Bilirubin Urine MODERATE (*)  Ketones, ur 15 (*)     Protein, ur 30 (*)    Urobilinogen, UA 2.0 (*)    Leukocytes, UA SMALL (*)    All other components within normal limits  URINE MICROSCOPIC-ADD ON - Abnormal; Notable for the following:    Bacteria, UA FEW (*)    All other components within normal limits  CBC - Abnormal; Notable for the following:    WBC 15.6 (*)    Platelets 124 (*)    All other components within normal limits  COMPREHENSIVE METABOLIC PANEL - Abnormal; Notable for the following:    Potassium 3.6 (*)    Glucose, Bld 113 (*)    BUN 24 (*)    Albumin 3.0 (*)    Total Bilirubin 1.9 (*)    GFR calc non Af Amer 58 (*)    GFR calc Af Amer 67 (*)    All other components within normal limits  PROTIME-INR - Abnormal; Notable for the following:    Prothrombin Time 15.7 (*)    All other components within normal limits  CBC - Abnormal; Notable for the following:    WBC 12.8 (*)    Hemoglobin 12.9 (*)    HCT 38.8 (*)    Platelets 140 (*)    All other components within normal limits  COMPREHENSIVE METABOLIC PANEL - Abnormal; Notable for the following:    Glucose, Bld 113 (*)    BUN 25 (*)    Albumin 2.8 (*)    GFR calc non Af Amer 52 (*)    GFR calc Af Amer 61 (*)    All other components within normal limits  CBC - Abnormal; Notable for the following:    RBC 4.14 (*)    Hemoglobin 12.3 (*)    HCT 37.2 (*)    Platelets 136 (*)    All other components within normal limits  COMPREHENSIVE METABOLIC PANEL - Abnormal; Notable for the following:    Potassium 3.5 (*)    Glucose, Bld 125 (*)    Albumin 2.5 (*)    GFR calc non Af Amer 81 (*)    All other components within normal limits  CBC - Abnormal; Notable for the following:    Hemoglobin 12.5 (*)    HCT 38.0 (*)    Platelets 147 (*)    All other components within normal limits  COMPREHENSIVE METABOLIC PANEL - Abnormal; Notable for the following:    Potassium 3.6 (*)    Glucose, Bld 122 (*)    Albumin 2.6 (*)    GFR calc non Af Amer 82 (*)    All other components  within normal limits  CULTURE, ROUTINE-ABSCESS  ANAEROBIC CULTURE  LIPASE, BLOOD  AMYLASE  LIPASE, BLOOD   Imaging Review  Dg Chest 2 View  07/15/2013   CLINICAL DATA:  Fever, vomiting, abdominal pain.  EXAM: CHEST  2 VIEW  COMPARISON:  06/21/2012  FINDINGS: There is marked dilatation of the right hemidiaphragm which is a new finding. There is associated atelectasis at the right base. Left lung is grossly clear. Normal heart size. No pneumothorax.  IMPRESSION: New elevation of the right hemidiaphragm. Differential diagnosis includes subpulmonic pleural effusion and splinting of the right hemidiaphragm secondary to an abdominal inflammatory process. There is associated atelectasis at the right lung base   Electronically Signed   By: Maryclare Bean M.D.   On: 07/15/2013 21:37   US Abdomen Limited  07/15/2013   CLINICAL DATA:  73 year old with a right upper  quadrant abdominal pain.  EXAM: US ABDOMEN LIMITED - RIGHT UPPER QUADRANT  COMPARISON:  CT abdomen and pelvis 02/05/2012, 03/08/2010, 01/09/2010.  FINDINGS: Gallbladder  Echogenic sludge and numerous small shadowing gallstones. Mild gallbladder wall thickening up to 5 mm. No visible pericholecystic fluid. Negative sonographic Murphy sign according to the ultrasound technologist.  Common bile duct  Diameter: 3-6 mm.  No visible bile duct stones.  Liver:  Diffusely increased and coarsened echotexture without focal hepatic parenchymal abnormality. Patent portal vein with hepatopetal flow.  IMPRESSION: 1. Cholelithiasis and gallbladder sludge. Mild gallbladder wall thickening is consistent with cholecystitis which may be acute or chronic. 2. No biliary ductal dilation. 3. Diffuse hepatic steatosis and/or hepatocellular disease. No focal hepatic parenchymal abnormality.   Electronically Signed   By: Evangeline Dakin M.D.   On: 07/15/2013 18:43    EKG Interpretation    Date/Time:  Monday July 15 2013 20:28:43 EST Ventricular Rate:  104 PR  Interval:  170 QRS Duration: 93 QT Interval:  373 QTC Calculation: 491 R Axis:   13 Text Interpretation:  Sinus tachycardia Abnormal R-wave progression, early transition Borderline prolonged QT interval ED PHYSICIAN INTERPRETATION AVAILABLE IN [facility] Confirmed by TEST, RECORD ([id number]) on 07/17/2013 8:36:09 AM            MDM   1. Cholecystitis, acute with cholelithiasis   2. Atherosclerosis of native arteries of the extremities with intermittent claudication   3. Atherosclerotic PVD with intermittent claudication   4. Back pain   5. Cholecystitis   6. Esophageal reflux   7. Other and unspecified hyperlipidemia   8. Peripheral vascular disease, unspecified   9. Unspecified essential hypertension   10. HYPERLIPIDEMIA   11. VARICOSE VEIN   12. GERD   13. DIVERTICULOSIS, COLON   14. COLONIC POLYPS, HX OF   15. Thyroid nodule   16. Fatigue   17. Vomiting and diarrhea   18. Decreased pulses in feet   19. PVD (peripheral vascular disease)   20. Cough   21. Rash and nonspecific skin eruption   22. Abdominal aneurysm without mention of rupture   23. Hard of hearing     72yM with abdominal pain. ED w/u consistent with cholecystitis. Abx. Surgical consultation. Admission.   Virgel Manifold, MD 07/22/13 [phone]

## 2013-07-22 LAB — ANAEROBIC CULTURE: GRAM STAIN: NONE SEEN

## 2013-07-23 ENCOUNTER — Telehealth (INDEPENDENT_AMBULATORY_CARE_PROVIDER_SITE_OTHER): Payer: Self-pay

## 2013-07-23 NOTE — Telephone Encounter (Signed)
Message copied by Carlene Coria on Tue Jul 23, 2013  1:54 PM ------      Message from: Zenovia Jarred      Created: Fri Jul 19, 2013 11:03 AM       Please make him an appointment to see Dr. Brantley Stage in 3 weeks and call him with day and time Monday or when you can. THX!      BT ------

## 2013-07-23 NOTE — Telephone Encounter (Signed)
Called pt with appt info

## 2013-07-26 ENCOUNTER — Encounter: Payer: Self-pay | Admitting: Family Medicine

## 2013-07-26 ENCOUNTER — Ambulatory Visit (INDEPENDENT_AMBULATORY_CARE_PROVIDER_SITE_OTHER): Payer: Medicare Other | Admitting: Family Medicine

## 2013-07-26 VITALS — BP 126/68 | HR 95 | Temp 97.3°F | Wt 189.5 lb

## 2013-07-26 DIAGNOSIS — K819 Cholecystitis, unspecified: Secondary | ICD-10-CM

## 2013-07-26 LAB — CBC WITH DIFFERENTIAL/PLATELET
Basophils Absolute: 0 10*3/uL (ref 0.0–0.1)
Basophils Relative: 0.3 % (ref 0.0–3.0)
EOS PCT: 1.2 % (ref 0.0–5.0)
Eosinophils Absolute: 0.1 10*3/uL (ref 0.0–0.7)
HEMATOCRIT: 42.2 % (ref 39.0–52.0)
HEMOGLOBIN: 14.1 g/dL (ref 13.0–17.0)
LYMPHS ABS: 1 10*3/uL (ref 0.7–4.0)
LYMPHS PCT: 11.2 % — AB (ref 12.0–46.0)
MCHC: 33.5 g/dL (ref 30.0–36.0)
MCV: 87.1 fl (ref 78.0–100.0)
Monocytes Absolute: 0.6 10*3/uL (ref 0.1–1.0)
Monocytes Relative: 6.8 % (ref 3.0–12.0)
NEUTROS ABS: 7.4 10*3/uL (ref 1.4–7.7)
Neutrophils Relative %: 80.5 % — ABNORMAL HIGH (ref 43.0–77.0)
Platelets: 245 10*3/uL (ref 150.0–400.0)
RBC: 4.84 Mil/uL (ref 4.22–5.81)
RDW: 14.3 % (ref 11.5–14.6)
WBC: 9.2 10*3/uL (ref 4.5–10.5)

## 2013-07-26 NOTE — Progress Notes (Signed)
Pre-visit discussion using our clinic review tool. No additional management support is needed unless otherwise documented below in the visit note.  Admitted with cholecystitis. Drain in place. No fevers.  Feels better overall. Last dose of abx will be today.  He had less drainage in the tube usually ~45 cc, then 110 cc yesterday. He was eating a little more recently.  Drain output initially was red/black, now more green/brown. No pain except expected soreness at the drain site.  Feels much better overall.   Hospital recs reviewed with patient.   Meds, vitals, and allergies reviewed.   ROS: See HPI.  Otherwise, noncontributory.  nad ncat Mmm rrr ctab Abd soft, drain in place, CDI Normal BS Ext w/o edema

## 2013-07-26 NOTE — Patient Instructions (Signed)
If you have a fever or more pain, then notify the clinic.  If the drain output continues to increase significantly, then notify the surgery clinic.  Avoid fatty meals.   Take care.  Glad to see you.

## 2013-07-28 NOTE — Assessment & Plan Note (Signed)
Much improved, will finish abx today.  Anatomy and plan d/w pt; appreciate surgery help.  Would recheck CBC today- unremarkable.   >25 min spent with face to face with patient, >50% counseling and/or coordinating care.

## 2013-07-29 ENCOUNTER — Encounter: Payer: Self-pay | Admitting: *Deleted

## 2013-08-06 ENCOUNTER — Encounter (INDEPENDENT_AMBULATORY_CARE_PROVIDER_SITE_OTHER): Payer: Self-pay

## 2013-08-06 ENCOUNTER — Encounter (INDEPENDENT_AMBULATORY_CARE_PROVIDER_SITE_OTHER): Payer: Self-pay | Admitting: Surgery

## 2013-08-06 ENCOUNTER — Ambulatory Visit (INDEPENDENT_AMBULATORY_CARE_PROVIDER_SITE_OTHER): Payer: Medicare Other | Admitting: Surgery

## 2013-08-06 VITALS — BP 124/86 | HR 72 | Temp 97.6°F | Resp 15 | Ht 69.0 in | Wt 189.0 lb

## 2013-08-06 DIAGNOSIS — K8000 Calculus of gallbladder with acute cholecystitis without obstruction: Secondary | ICD-10-CM

## 2013-08-06 NOTE — Patient Instructions (Signed)
Laparoscopic Cholecystectomy °Laparoscopic cholecystectomy is surgery to remove the gallbladder. The gallbladder is located in the upper right part of the abdomen, behind the liver. It is a storage sac for bile produced in the liver. Bile aids in the digestion and absorption of fats. Cholecystectomy is often done for inflammation of the gallbladder (cholecystitis). This condition is usually caused by a buildup of gallstones (cholelithiasis) in your gallbladder. Gallstones can block the flow of bile, resulting in inflammation and pain. In severe cases, emergency surgery may be required. When emergency surgery is not required, you will have time to prepare for the procedure. °Laparoscopic surgery is an alternative to open surgery. Laparoscopic surgery has a shorter recovery time. Your common bile duct may also need to be examined during the procedure. If stones are found in the common bile duct, they may be removed. °LET YOUR HEALTH CARE PROVIDER KNOW ABOUT: °· Any allergies you have. °· All medicines you are taking, including vitamins, herbs, eye drops, creams, and over-the-counter medicines. °· Previous problems you or members of your family have had with the use of anesthetics. °· Any blood disorders you have. °· Previous surgeries you have had. °· Medical conditions you have. °RISKS AND COMPLICATIONS °Generally, this is a safe procedure. However, as with any procedure, complications can occur. Possible complications include: °· Infection. °· Damage to the common bile duct, nerves, arteries, veins, or other internal organs such as the stomach, liver, or intestines. °· Bleeding. °· A stone may remain in the common bile duct. °· A bile leak from the cyst duct that is clipped when your gallbladder is removed. °· The need to convert to open surgery, which requires a larger incision in the abdomen. This may be necessary if your surgeon thinks it is not safe to continue with a laparoscopic procedure. °BEFORE THE  PROCEDURE °· Ask your health care provider about changing or stopping any regular medicines. You will need to stop taking aspirin or blood thinners at least 5 days prior to surgery. °· Do not eat or drink anything after midnight the night before surgery. °· Let your health care provider know if you develop a cold or other infectious problem before surgery. °PROCEDURE  °· You will be given medicine to make you sleep through the procedure (general anesthetic). A breathing tube will be placed in your mouth. °· When you are asleep, your surgeon will make several small cuts (incisions) in your abdomen. °· A thin, lighted tube with a tiny camera on the end (laparoscope) is inserted through one of the small incisions. The camera on the laparoscope sends a picture to a TV screen in the operating room. This gives the surgeon a good view inside your abdomen. °· A gas will be pumped into your abdomen. This expands your abdomen so that the surgeon has more room to perform the surgery. °· Other tools needed for the procedure are inserted through the other incisions. The gallbladder is removed through one of the incisions. °· After the removal of your gallbladder, the incisions will be closed with stitches, staples, or skin glue. °AFTER THE PROCEDURE °· You will be taken to a recovery area where your progress will be checked often. °· You may be allowed to go home the same day if your pain is controlled and you can tolerate liquids. °Document Released: 06/20/2005 Document Revised: 04/10/2013 Document Reviewed: 01/30/2013 °[employer]® Patient Information ©2014 [employer]. ° °

## 2013-08-06 NOTE — Progress Notes (Signed)
Patient ID: [name], male   DOB: Dec 09, 1940, 73 y.o.   MRN: [id number]  Chief Complaint  Patient presents with  . Routine Post Op    1st p/o chole drain    HPI [name] is a 73 y.o. male.  Patient returns for followup of acute cholecystitis. He was admitted to the hospital in mid January to acute cholecystitis. Due to the amount of inflammation and prolong time of inflammation, a cholecystectomy to was recommended and performed on 07/17/2013. He feels much better. He is eating better and his activity level is almost to baseline. HPI  Past Medical History  Diagnosis Date  . Hyperlipidemia   . Hypertension   . OAD (obstructive airway disease)   . GERD (gastroesophageal reflux disease)   . Diverticulosis   . ED (erectile dysfunction)     no relief with cialis, etc  . Back pain     per [facility]  . Thyroid nodule   . Anemia   . Irregular heartbeat   . Peripheral vascular disease   . Bronchitis   . AAA (abdominal aortic aneurysm)   . Emphysema     "has a little bit" (07/16/2013)  . Pneumonia     "twice since 1959; once when he was a child" (07/16/2013)  . Exertional shortness of breath   . DDD (degenerative disc disease)     cervical, followed by [facility] 2012    Past Surgical History  Procedure Laterality Date  . Small intestine surgery      "blockage" (07/16/2013)  . Skin graft Left     "hand"  . Tonsillectomy    . Colonoscopy w/ polypectomy    . Aortogram  Aug. 1, 2013  . Colectomy  1961  . Iliac artery stent Right 02/2012    "Dr. Bridgett Larsson"    Family History  Problem Relation Age of Onset  . Heart attack Mother   . Heart disease Mother   . Hyperlipidemia Mother   . Stroke Father   . Heart attack Father   . Heart disease Father   . Hyperlipidemia Father   . Hypertension Father   . Other Father     varicose veins  . Heart attack Brother   . Prostate cancer Brother   . Cancer Brother     lung  . Other Brother     varicose veins  . Heart attack Brother   .  Heart disease Brother   . Hyperlipidemia Brother   . Other Brother     varicose veins  . Breast cancer Sister   . Diabetes Sister   . Cancer Sister     stomach  . Heart disease Brother   . Hyperlipidemia Brother   . Hypertension Brother   . Heart attack Brother     Social History History  Substance Use Topics  . Smoking status: Former Smoker -- 3.00 packs/day for 30 years    Types: Cigarettes    Quit date: 02/28/1982  . Smokeless tobacco: Never Used  . Alcohol Use: 0.0 oz/week     Comment: 07/16/2013 "used to drink a couple beers/night; stopped  02/2012"    Allergies  Allergen Reactions  . Hydrochlorothiazide Other (See Comments)    REACTION: joint pain  . Benazepril Hcl Other (See Comments)    unknown    Current Outpatient Prescriptions  Medication Sig Dispense Refill  . albuterol (PROVENTIL HFA;VENTOLIN HFA) 108 (90 BASE) MCG/ACT inhaler Inhale 1-2 puffs into the lungs every 6 (six) hours as  needed for wheezing.  1 Inhaler  5  . aspirin EC 325 MG EC tablet Take 1 tablet (325 mg total) by mouth daily.  30 tablet  0  . clopidogrel (PLAVIX) 75 MG tablet Take 75 mg by mouth every evening.      . diltiazem (CARDIZEM) 120 MG tablet Take 120 mg by mouth 2 (two) times daily.      . finasteride (PROSCAR) 5 MG tablet Take 5 mg by mouth every evening.       Marland Kitchen omeprazole (PRILOSEC) 20 MG capsule Take 20 mg by mouth every morning.      . pravastatin (PRAVACHOL) 40 MG tablet Take 20 mg by mouth every evening. Takes half tab daily      . tamsulosin (FLOMAX) 0.4 MG CAPS capsule Take 0.4 mg by mouth daily after supper.       No current facility-administered medications for this visit.    Review of Systems Review of Systems  Constitutional: Negative.   HENT: Negative.   Eyes: Negative.   Respiratory: Negative.   Cardiovascular: Negative.   Gastrointestinal: Negative.   Endocrine: Negative.   Genitourinary: Negative.   Musculoskeletal: Negative.   Skin: Negative.     Allergic/Immunologic: Negative.   Neurological: Negative.   Hematological: Negative.   Psychiatric/Behavioral: Negative.     Blood pressure 124/86, pulse 72, temperature 97.6 F (36.4 C), temperature source Oral, resp. rate 15, height 5\' 9"  (1.753 m), weight 189 lb (85.73 kg).  Physical Exam Physical Exam  Constitutional: He appears well-developed and well-nourished.  HENT:  Head: Normocephalic and atraumatic.  Eyes: Pupils are equal, round, and reactive to light. No scleral icterus.  Neck: Normal range of motion. Neck supple.  Cardiovascular: Normal rate and regular rhythm.   Pulmonary/Chest: Effort normal and breath sounds normal.  Abdominal: He exhibits no distension. There is no tenderness. There is no rebound.      Data Reviewed none  Assessment    History of acute cholecystitis status post percutaneous drainage 07/17/2013.  Patient Active Problem List   Diagnosis Date Noted  . Cholecystitis 07/15/2013  . Hard of hearing 06/12/2013  . Abdominal aneurysm without mention of rupture 03/22/2013  . Rash and nonspecific skin eruption 09/24/2012  . Cough 06/10/2012  . Peripheral vascular disease, unspecified 06/08/2012  . Atherosclerotic PVD with intermittent claudication 03/02/2012  . Atherosclerosis of native arteries of the extremities with intermittent claudication 12/23/2011  . PVD (peripheral vascular disease) 11/16/2011  . Decreased pulses in feet 11/09/2011  . Vomiting and diarrhea 07/14/2011  . Back pain 06/22/2011  . Thyroid nodule 06/21/2011  . Fatigue 06/21/2011  . DIVERTICULOSIS, COLON 01/28/2010  . VARICOSE VEIN 10/01/2008  . UNSPECIFIED ESSENTIAL HYPERTENSION 04/03/2008  . GERD 04/03/2008  . COLONIC POLYPS, HX OF 04/03/2008  . HYPERLIPIDEMIA 12/14/2006      Plan    He is doing well. We will schedule for laparoscopic cholecystectomy with cholangiogram 4-6 weeks from now. He requires cardiac clearance due to history of vascular disease. Once this  is done, will schedule for laparoscopic cholecystectomy.The procedure has been discussed with the patient. Operative and non operative treatments have been discussed. Risks of surgery include bleeding, infection,  Common bile duct injury,  Injury to the stomach,liver, colon,small intestine, abdominal wall,  Diaphragm,  Major blood vessels,  And the need for an open procedure.  Other risks include worsening of medical problems, death,  DVT and pulmonary embolism, and cardiovascular events.   Medical options have also been discussed. The patient has  been informed of long term expectations of surgery and non surgical options,  The patient agrees to proceed.         Lavren Lewan A. 08/06/2013, 10:46 AM

## 2013-08-21 ENCOUNTER — Encounter: Payer: Self-pay | Admitting: Cardiovascular Disease

## 2013-08-21 ENCOUNTER — Ambulatory Visit (INDEPENDENT_AMBULATORY_CARE_PROVIDER_SITE_OTHER): Payer: Medicare Other | Admitting: Cardiovascular Disease

## 2013-08-21 ENCOUNTER — Encounter (INDEPENDENT_AMBULATORY_CARE_PROVIDER_SITE_OTHER): Payer: Self-pay

## 2013-08-21 VITALS — BP 134/70 | HR 68 | Ht 69.0 in | Wt 190.4 lb

## 2013-08-21 DIAGNOSIS — I739 Peripheral vascular disease, unspecified: Secondary | ICD-10-CM

## 2013-08-21 DIAGNOSIS — Z01810 Encounter for preprocedural cardiovascular examination: Secondary | ICD-10-CM

## 2013-08-21 DIAGNOSIS — I70219 Atherosclerosis of native arteries of extremities with intermittent claudication, unspecified extremity: Secondary | ICD-10-CM

## 2013-08-21 DIAGNOSIS — E785 Hyperlipidemia, unspecified: Secondary | ICD-10-CM

## 2013-08-21 NOTE — Assessment & Plan Note (Signed)
F/U Dr Bridgett Larsson  Stable mild claudication TBI on left low .35  Should not be an issue to stop Plavix 5-7 days before GB surgery

## 2013-08-21 NOTE — Assessment & Plan Note (Signed)
Cholesterol is at goal.  Continue current dose of statin and diet Rx.  No myalgias or side effects.  F/U  LFT's in 6 months. Lab Results  Component Value Date   LDLCALC 69 09/24/2012

## 2013-08-21 NOTE — Progress Notes (Signed)
Patient ID: [name], male   DOB: 1941/06/07, 73 y.o.   MRN: [id number]  73 yo referred for preop clearance Needs cholecystectomy.  Recently hospitalized and IR placed drainage tube.  Needed 8 weeks of antibiotics.  On week 6  Also had neurogenic bladder and foley for a while  Has recovered from his acute sepsis.  Abdomen improved.  Has PVD and sees Dr Bridgett Larsson.  ABI"s stable and ambulates with limited claudication Iliac Stents placed 2013 and has been on Plavix.  No chest pain Has 2 brothers that have had stents and one just had CABG yesterday.  Elevated cholesterol  Mild reactive airway disease uses inhaler.  No bleeding diathesis and no previous anesthetic issues     ROS: Denies fever, malais, weight loss, blurry vision, decreased visual acuity, cough, sputum, SOB, hemoptysis, pleuritic pain, palpitaitons, heartburn, abdominal pain, melena, lower extremity edema, claudication, or rash.  All other systems reviewed and negative   General: Affect appropriate Healthy:  appears stated age 50: normal Neck supple with no adenopathy JVP normal no bruits no thyromegaly Lungs clear with no wheezing and good diaphragmatic motion Heart:  S1/S2 no murmur,rub, gallop or click PMI normal Abdomen: benighn, BS positve, no tenderness, no AAA  No femoral bruits   J-tube in Gallblader  no bruit.  No HSM or HJR Unable to feel PT/DP bilaterally  No edema Neuro non-focal Skin warm and dry No muscular weakness  Medications Current Outpatient Prescriptions  Medication Sig Dispense Refill  . albuterol (PROVENTIL HFA;VENTOLIN HFA) 108 (90 BASE) MCG/ACT inhaler Inhale 1-2 puffs into the lungs every 6 (six) hours as needed for wheezing.  1 Inhaler  5  . aspirin EC 325 MG EC tablet Take 1 tablet (325 mg total) by mouth daily.  30 tablet  0  . clopidogrel (PLAVIX) 75 MG tablet Take 75 mg by mouth every evening.      . diltiazem (CARDIZEM) 120 MG tablet Take 120 mg by mouth 2 (two) times daily.      .  finasteride (PROSCAR) 5 MG tablet Take 5 mg by mouth every evening.       Marland Kitchen omeprazole (PRILOSEC) 20 MG capsule Take 20 mg by mouth every morning.      . pravastatin (PRAVACHOL) 40 MG tablet Take 20 mg by mouth every evening. Takes half tab daily      . tamsulosin (FLOMAX) 0.4 MG CAPS capsule Take 0.4 mg by mouth daily after supper.       No current facility-administered medications for this visit.    Allergies Hydrochlorothiazide and Benazepril hcl  Family History: Family History  Problem Relation Age of Onset  . Heart attack Mother   . Heart disease Mother   . Hyperlipidemia Mother   . Stroke Father   . Heart attack Father   . Heart disease Father   . Hyperlipidemia Father   . Hypertension Father   . Other Father     varicose veins  . Heart attack Brother   . Prostate cancer Brother   . Cancer Brother     lung  . Other Brother     varicose veins  . Heart attack Brother   . Heart disease Brother   . Hyperlipidemia Brother   . Other Brother     varicose veins  . Breast cancer Sister   . Diabetes Sister   . Cancer Sister     stomach  . Heart disease Brother   . Hyperlipidemia Brother   . Hypertension  Brother   . Heart attack Brother     Social History: History   Social History  . Marital Status: Married    Spouse Name: N/A    Number of Children: N/A  . Years of Education: N/A   Occupational History  . Not on file.   Social History Main Topics  . Smoking status: Former Smoker -- 3.00 packs/day for 30 years    Types: Cigarettes    Quit date: 02/28/1982  . Smokeless tobacco: Never Used  . Alcohol Use: 0.0 oz/week     Comment: 07/16/2013 "used to drink a couple beers/night; stopped  02/2012"  . Drug Use: No  . Sexual Activity: Not Currently   Other Topics Concern  . Not on file   Social History Narrative   Married 1959   Retired from machine shop at [employer]   Enjoys fishing   1 daughter and 1 son    Electrocardiogram:  SR nonspecific ST/T wave  changes   Assessment and Plan   

## 2013-08-21 NOTE — Patient Instructions (Signed)
Your physician recommends that you schedule a follow-up appointment in:   3 MONTHS   WITH  DR NISHAN Your physician recommends that you continue on your current medications as directed. Please refer to the Current Medication list given to you today.  Your physician has requested that you have en exercise stress myoview. For further information please visit [email or web address]. Please follow instruction sheet, as given.  

## 2013-08-21 NOTE — Assessment & Plan Note (Signed)
Elderly male with PVD undergoing moderate risk surgery  Exercise myovue to clear.  Strong family history

## 2013-08-27 ENCOUNTER — Inpatient Hospital Stay (HOSPITAL_COMMUNITY): Admission: RE | Admit: 2013-08-27 | Payer: Medicare Other | Source: Ambulatory Visit

## 2013-09-05 ENCOUNTER — Ambulatory Visit (HOSPITAL_COMMUNITY)
Admission: RE | Admit: 2013-09-05 | Discharge: 2013-09-05 | Disposition: A | Discharge status: Home / Self Care | Payer: Medicare Other | Source: Ambulatory Visit | Attending: Cardiology | Admitting: Cardiology

## 2013-09-05 DIAGNOSIS — Z01810 Encounter for preprocedural cardiovascular examination: Secondary | ICD-10-CM

## 2013-09-05 MED ORDER — TECHNETIUM TC 99M SESTAMIBI GENERIC - CARDIOLITE
10.2000 | Freq: Once | INTRAVENOUS | Status: AC | PRN
  Administered 2013-09-05: 10 via INTRAVENOUS

## 2013-09-05 MED ORDER — TECHNETIUM TC 99M SESTAMIBI GENERIC - CARDIOLITE
29.6000 | Freq: Once | INTRAVENOUS | Status: AC | PRN
  Administered 2013-09-05: 30 via INTRAVENOUS

## 2013-09-05 NOTE — Procedures (Addendum)
[facility] [address] [place] [address] [phone]  Cardiology Nuclear Med Study  [name] is a 73 y.o. male     MRN : [id number]     DOB: 1940/10/14  Procedure Date: 09/05/2013  Nuclear Med Background Indication for Stress Test:  Surgical Clearance and Abnormal EKG History:  Emphysema and STENT/PTCA--2013;reactive airway disease Cardiac Risk Factors: Family History - CAD, History of Smoking, Hypertension, Overweight and PVD  Symptoms:  Fatigue   Nuclear Pre-Procedure Caffeine/Decaff Intake:  12:00am NPO After: 10am   IV Site: R Forearm  IV 0.9% NS with Angio Cath:  22g  Chest Size (in):  42"  IV Started by: Azucena Cecil, RN  Height: 5\' 9"  (1.753 m)  Cup Size: n/a  BMI:  Body mass index is 27.9 kg/(m^2). Weight:  189 lb (85.73 kg)   Tech Comments:  n/a    Nuclear Med Study 1 or 2 day study: 1 day  Stress Test Type:  Stress  Order Authorizing Provider:  [name], MD   Resting Radionuclide: Technetium 38m Sestamibi  Resting Radionuclide Dose: 10.2 mCi   Stress Radionuclide:  Technetium 8m Sestamibi  Stress Radionuclide Dose: 29.6 mCi           Stress Protocol Rest HR: 107 Stress HR: 134  Rest BP: 134/69 Stress BP: 205/77  Exercise Time (min): 5:01 METS: 5.30   Predicted Max HR: 146 bpm % Max HR: 91.78 bpm Rate Pressure Product: 27470  Dose of Adenosine (mg):  n/a Dose of Lexiscan: n/a mg  Dose of Atropine (mg): n/a Dose of Dobutamine: n/a mcg/kg/min (at max HR)  Stress Test Technologist: Mellody Memos, CCT Nuclear Technologist: Imagene Riches, CNMT   Rest Procedure:  Myocardial perfusion imaging was performed at rest 45 minutes following the intravenous administration of Technetium 76m Sestamibi. Stress Procedure:  The patient performed treadmill exercise using a Bruce  Protocol for 5 minutes and 1 second. The patient stopped due to shortness of breath and fatigue. Patient denied any chest  pain.  There were no significant ST-T wave changes.  Technetium 15m Sestamibi was injected at peak exercise and myocardial perfusion imaging was performed after a brief delay.  Transient Ischemic Dilatation (Normal <1.22):  1.13 Lung/Heart Ratio (Normal <0.45):  0.27 QGS EDV:  72 ml QGS ESV:  31 ml LV Ejection Fraction: 58%     Rest ECG: NSR - Normal EKG  Stress ECG: Significant ST abnormalities consistent with ischemia. 2-3 mm horizontal/downsloping ST segment depression in V5-V6 and all inferior leads  QPS Raw Data Images:  Normal; no motion artifact; normal heart/lung ratio. Stress Images:  There is decreased uptake in the inferior wall and inferior septum. Rest Images:  Normal homogeneous uptake in all areas of the myocardium. Subtraction (SDS):  These findings are consistent with ischemia. LV Wall Motion:  Normal LV function, inferior wall mild hypokinesis.  Impression Exercise Capacity:  Fair exercise capacity. BP Response:  Hypertensive blood pressure response. Clinical Symptoms:  The exercise was limited by dyspnea. ECG Impression:  Significant ST abnormalities consistent with ischemia. Comparison with Prior Nuclear Study: No previous nuclear study performed   Overall Impression:  High risk stress nuclear study consistent with ischemia in the right coronary artery territory.Sanda Klein, MD  09/05/2013 4:46 PM

## 2013-09-06 ENCOUNTER — Encounter: Payer: Self-pay | Admitting: *Deleted

## 2013-09-06 ENCOUNTER — Other Ambulatory Visit: Payer: Self-pay | Admitting: *Deleted

## 2013-09-06 ENCOUNTER — Telehealth: Payer: Self-pay | Admitting: *Deleted

## 2013-09-06 ENCOUNTER — Other Ambulatory Visit (INDEPENDENT_AMBULATORY_CARE_PROVIDER_SITE_OTHER): Payer: Medicare Other

## 2013-09-06 ENCOUNTER — Encounter (HOSPITAL_COMMUNITY): Payer: Self-pay | Admitting: Pharmacy Technician

## 2013-09-06 DIAGNOSIS — Z79899 Other long term (current) drug therapy: Secondary | ICD-10-CM

## 2013-09-06 DIAGNOSIS — Z01810 Encounter for preprocedural cardiovascular examination: Secondary | ICD-10-CM

## 2013-09-06 DIAGNOSIS — R0602 Shortness of breath: Secondary | ICD-10-CM

## 2013-09-06 LAB — BASIC METABOLIC PANEL
BUN: 17 mg/dL (ref 6–23)
CALCIUM: 9 mg/dL (ref 8.4–10.5)
CO2: 26 meq/L (ref 19–32)
Chloride: 103 mEq/L (ref 96–112)
Creatinine, Ser: 0.9 mg/dL (ref 0.4–1.5)
GFR: 87.66 mL/min (ref >60.00)
GLUCOSE: 102 mg/dL — AB (ref 70–99)
Potassium: 4.2 mEq/L (ref 3.5–5.1)
Sodium: 136 mEq/L (ref 135–145)

## 2013-09-06 LAB — PROTIME-INR
INR: 1.2 ratio — ABNORMAL HIGH (ref 0.8–1.0)
Prothrombin Time: 13 s — ABNORMAL HIGH (ref 10.2–12.4)

## 2013-09-06 LAB — CBC WITH DIFFERENTIAL/PLATELET
BASOS ABS: 0.1 10*3/uL (ref 0.0–0.1)
Basophils Relative: 0.8 % (ref 0.0–3.0)
EOS PCT: 3.2 % (ref 0.0–5.0)
Eosinophils Absolute: 0.2 10*3/uL (ref 0.0–0.7)
HEMATOCRIT: 40.1 % (ref 39.0–52.0)
HEMOGLOBIN: 13.3 g/dL (ref 13.0–17.0)
LYMPHS ABS: 1.1 10*3/uL (ref 0.7–4.0)
Lymphocytes Relative: 16.1 % (ref 12.0–46.0)
MCHC: 33.1 g/dL (ref 30.0–36.0)
MCV: 88.4 fl (ref 78.0–100.0)
MONO ABS: 0.7 10*3/uL (ref 0.1–1.0)
Monocytes Relative: 9.6 % (ref 3.0–12.0)
Neutro Abs: 5 10*3/uL (ref 1.4–7.7)
Neutrophils Relative %: 70.3 % (ref 43.0–77.0)
PLATELETS: 183 10*3/uL (ref 150.0–400.0)
RBC: 4.54 Mil/uL (ref 4.22–5.81)
RDW: 14.9 % — AB (ref 11.5–14.6)
WBC: 7.1 10*3/uL (ref 4.5–10.5)

## 2013-09-06 MED ORDER — DILTIAZEM HCL 120 MG PO TABS: 120.0000 mg | ORAL_TABLET | Freq: Two times a day (BID) | ORAL | Status: DC

## 2013-09-06 NOTE — Telephone Encounter (Signed)
[facility] More Detail >>      Josue Hector, MD      Sent: Thu September 05, 2013  8:46 PM      To: Richmond Campbell, LPN              Message      High risk nuclear needs cath  Set up for me on 11th or 12th next week 7:30 or 8:30 am case      Can get labs early next week                Results   Myocardial Perfusion Imaging (Order [id number])          Result Information      Status Provider Status        Edited Result - FINAL (09/05/2013  4:49 PM) Reviewed                Nuclear Med Result Note      No notes of this type exist for this encounter.           Myocardial Perfusion Imaging  Status: Edited Result - FINAL     Visible to patient: This result is not viewable by the patient.     Next appt: 09/19/2013 at 09:00 AM in Radiology ([facility] [place])     Dx: Pre-operative cardiovascular examination                  Result Narrative            Sanda Klein, MD     09/05/2013  4:49 PM [facility] [address] [place] [address] [phone]  Cardiology Nuclear Med Study  CONSTANT MANDEVILLE is a 73 y.o. male     MRN : [id number]     DOB:  [birth year]  Procedure Date: 09/05/2013  Nuclear Med Background Indication for Stress Test:  Surgical Clearance and Abnormal EKG History:  Emphysema and STENT/PTCA--2013;reactive airway disease Cardiac Risk Factors: Family History - CAD, History of Smoking,  Hypertension, Overweight and PVD  Symptoms:  Fatigue   Nuclear Pre-Procedure Caffeine/Decaff Intake:  12:00am NPO After: 10am   IV Site: R Forearm  IV 0.9% NS with Angio Cath:  22g  Chest Size (in):  42"  IV Started by: Azucena Cecil, RN  Height: 5\' 9"  (1.753 m)  Cup Size: n/a  BMI:  Body mass index is 27.9 kg/(m^2). Weight:  189 lb (85.73  kg)   Tech Comments:  n/a    Nuclear Med Study 1 or 2 day study: 1 day  Stress Test Type:  Stress  Order Authorizing Provider:  [name], MD   Resting  Radionuclide: Technetium 10m Sestamibi  Resting  Radionuclide Dose: 10.2 mCi   Stress Radionuclide:  Technetium 9m Sestamibi  Stress  Radionuclide Dose: 29.6 mCi           Stress Protocol Rest HR: 107 Stress HR: 134  Rest BP: 134/69 Stress BP: 205/77  Exercise Time (min): 5:01 METS: 5.30   Predicted Max HR: 146 bpm % Max HR: 91.78 bpm Rate Pressure Product: 27470  Dose of Adenosine (mg):  n/a Dose of Lexiscan: n/a mg  Dose of Atropine (mg): n/a Dose of Dobutamine: n/a mcg/kg/min (at max HR)  Stress Test Technologist: Mellody Memos, CCT Nuclear  Technologist: Imagene Riches, CNMT   Rest Procedure:  Myocardial perfusion imaging was performed at  rest 45 minutes following the  intravenous administration of  Technetium 81m Sestamibi. Stress Procedure:  The patient performed treadmill exercise using a Bruce  Protocol for 5 minutes and 1 second. The patient stopped due to shortness of breath and fatigue. Patient denied any chest  pain.  There were no significant ST-T wave changes.  Technetium  13m Sestamibi was injected at peak exercise and myocardial  perfusion imaging was performed after a brief delay.  Transient Ischemic Dilatation (Normal <1.22):  1.13 Lung/Heart Ratio (Normal <0.45):  0.27 QGS EDV:  72 ml QGS ESV:  31 ml LV Ejection Fraction: 58%     Rest ECG: NSR - Normal EKG  Stress ECG: Significant ST abnormalities consistent with  ischemia. 2-3 mm horizontal/downsloping ST segment depression in  V5-V6 and all inferior leads  QPS Raw Data Images:  Normal; no motion artifact; normal heart/lung  ratio. Stress Images:  There is decreased uptake in the inferior wall  and inferior septum. Rest Images:  Normal homogeneous uptake in all areas of the  myocardium. Subtraction (SDS):  These findings are consistent with ischemia. LV Wall Motion:  Normal LV function, inferior wall mild  hypokinesis.  Impression Exercise Capacity:  Fair exercise capacity. BP Response:   Hypertensive blood pressure response. Clinical Symptoms:  The exercise was limited by dyspnea. ECG Impression:  Significant ST abnormalities consistent with  ischemia. Comparison with Prior Nuclear Study: No previous nuclear study  performed   Overall Impression:  High risk stress nuclear study consistent  with ischemia in the right coronary artery territory.Sanda Klein, MD  09/05/2013 4:46 PM             Last Resulted: 09/05/13  4:49 PM Order Details View Encounter Lab and Collection Details Routing Result History        Results            Scan on 09/06/2013  8:58 AM by Caren Griffins B. Roberts : STRESS TEST IMAGESScan on 09/06/2013  8:58 AM by Elzie Rings. Roberts : STRESS TEST IMAGES                   Scan on 09/06/2013  8:56 AM by Elzie Rings. Mancel Bale Rosana Hoes FORMScan on 09/06/2013  8:56 AM by Elzie Rings. Mancel Bale : CONSENT FORM                   Scan on 09/06/2013  8:56 AM by Elzie Rings. Roberts : STRESS TEST PERFUSION PATIENT HISTORY SHEETScan on 09/06/2013  8:56 AM by Elzie Rings. Roberts : STRESS TEST PERFUSION PATIENT HISTORY SHEET                   Scan on 09/06/2013  8:55 AM by Elzie Rings. Mancel Bale : STRESS TEST CARDIOVASLAR IMAGING BRUCE PROTOCOL SHEETScan on 09/06/2013  8:55 AM by Elzie Rings. Mancel Bale : STRESS TEST CARDIOVASLAR IMAGING BRUCE PROTOCOL SHEET                      Reviewed by List      Josue Hector, MD on 09/06/2013 10:29 AM      Josue Hector, MD on 09/05/2013  9:07 PM             Order   Myocardial Perfusion Imaging [CAR2012] (Order [id number])          Order Providers      Authorizing Encounter Billing      Josue Hector [facility] [place]  Original Order      Ordered On Ordered By        Wed Aug 21, 2013 10:58 AM Richmond Campbell, LPN                       Associated Diagnoses      Pre-operative cardiovascular examination [V72.81]                 Order Questions      Question Answer Comment      Where should this test  be performed [facility])        Type of stress Exercise        Patient weight in lbs 190                 Order CC Information      Recipient Phone      Josue Hector, MD [place], [place] [phone]              Appointments for this Order      09/05/2013  1:00 PM  - 15 min [facility] ([facility]) [facility]               Additional Information      Associated Agricultural consultant and Order Details              PT  AND PT'S WIFE  AWARE  CATH SCHEDULED FOR   09-11-13 AT   9:00 AM

## 2013-09-10 MED ORDER — CEFAZOLIN SODIUM-DEXTROSE 2-3 GM-% IV SOLR: 2.0000 g | INTRAVENOUS | Status: DC

## 2013-09-11 ENCOUNTER — Encounter (HOSPITAL_COMMUNITY)
Admission: RE | Disposition: A | Discharge status: Home / Self Care | Payer: Self-pay | Source: Ambulatory Visit | Attending: Cardiovascular Disease

## 2013-09-11 ENCOUNTER — Ambulatory Visit (HOSPITAL_COMMUNITY)
Admission: RE | Admit: 2013-09-11 | Discharge: 2013-09-11 | Disposition: A | Discharge status: Home / Self Care | Payer: Medicare Other | Source: Ambulatory Visit | Attending: Cardiovascular Disease | Admitting: Cardiovascular Disease

## 2013-09-11 DIAGNOSIS — I2510 Atherosclerotic heart disease of native coronary artery without angina pectoris: Secondary | ICD-10-CM | POA: Insufficient documentation

## 2013-09-11 DIAGNOSIS — Z87891 Personal history of nicotine dependence: Secondary | ICD-10-CM | POA: Insufficient documentation

## 2013-09-11 DIAGNOSIS — Z01818 Encounter for other preprocedural examination: Secondary | ICD-10-CM | POA: Diagnosis present

## 2013-09-11 HISTORY — PX: LEFT HEART CATHETERIZATION WITH CORONARY ANGIOGRAM: SHX5451

## 2013-09-11 SURGERY — LEFT HEART CATHETERIZATION WITH CORONARY ANGIOGRAM
Anesthesia: LOCAL

## 2013-09-11 MED ORDER — LIDOCAINE HCL (PF) 1 % IJ SOLN
INTRAMUSCULAR | Status: AC
  Filled 2013-09-11: qty 30

## 2013-09-11 MED ORDER — SODIUM CHLORIDE 0.9 % IV SOLN
INTRAVENOUS | Status: DC
  Administered 2013-09-11: 07:00:00 via INTRAVENOUS

## 2013-09-11 MED ORDER — SODIUM CHLORIDE 0.9 % IJ SOLN: 3.0000 mL | INTRAMUSCULAR | Status: DC | PRN

## 2013-09-11 MED ORDER — SODIUM CHLORIDE 0.9 % IV SOLN: 250.0000 mL | INTRAVENOUS | Status: DC | PRN

## 2013-09-11 MED ORDER — HEPARIN SODIUM (PORCINE) 1000 UNIT/ML IJ SOLN
INTRAMUSCULAR | Status: AC
  Filled 2013-09-11: qty 1

## 2013-09-11 MED ORDER — SODIUM CHLORIDE 0.45 % IV SOLN: INTRAVENOUS | Status: DC

## 2013-09-11 MED ORDER — ASPIRIN 81 MG PO CHEW
81.0000 mg | CHEWABLE_TABLET | ORAL | Status: AC
  Administered 2013-09-11: 81 mg via ORAL
  Filled 2013-09-11: qty 1

## 2013-09-11 MED ORDER — MIDAZOLAM HCL 2 MG/2ML IJ SOLN
INTRAMUSCULAR | Status: AC
  Filled 2013-09-11: qty 2

## 2013-09-11 MED ORDER — SODIUM CHLORIDE 0.9 % IJ SOLN: 3.0000 mL | Freq: Two times a day (BID) | INTRAMUSCULAR | Status: DC

## 2013-09-11 MED ORDER — ASPIRIN 81 MG PO CHEW: 81.0000 mg | CHEWABLE_TABLET | Freq: Every day | ORAL | Status: DC

## 2013-09-11 MED ORDER — NITROGLYCERIN 0.2 MG/ML ON CALL CATH LAB
INTRAVENOUS | Status: AC
  Filled 2013-09-11: qty 1

## 2013-09-11 MED ORDER — HEPARIN (PORCINE) IN NACL 2-0.9 UNIT/ML-% IJ SOLN
INTRAMUSCULAR | Status: AC
  Filled 2013-09-11: qty 1500

## 2013-09-11 NOTE — Interval H&P Note (Signed)
History and Physical Interval Note:  09/11/2013 8:49 AM  [name]  has presented today for surgery, with the diagnosis of abnormal myoview  The various methods of treatment have been discussed with the patient and family. After consideration of risks, benefits and other options for treatment, the patient has consented to  Procedure(s): LEFT HEART CATHETERIZATION WITH CORONARY ANGIOGRAM (N/A) as a surgical intervention .  The patient's history has been reviewed, patient examined, no change in status, stable for surgery.  I have reviewed the patient's chart and labs.  Questions were answered to the patient's satisfaction.     [name]  Myovue high risk with inferior ishcemia and stress induced ECG changes

## 2013-09-11 NOTE — H&P (View-Only) (Signed)
Patient ID: [name], male   DOB: 1941/06/07, 73 y.o.   MRN: [id number]  73 yo referred for preop clearance Needs cholecystectomy.  Recently hospitalized and IR placed drainage tube.  Needed 8 weeks of antibiotics.  On week 6  Also had neurogenic bladder and foley for a while  Has recovered from his acute sepsis.  Abdomen improved.  Has PVD and sees Dr Bridgett Larsson.  ABI"s stable and ambulates with limited claudication Iliac Stents placed 2013 and has been on Plavix.  No chest pain Has 2 brothers that have had stents and one just had CABG yesterday.  Elevated cholesterol  Mild reactive airway disease uses inhaler.  No bleeding diathesis and no previous anesthetic issues     ROS: Denies fever, malais, weight loss, blurry vision, decreased visual acuity, cough, sputum, SOB, hemoptysis, pleuritic pain, palpitaitons, heartburn, abdominal pain, melena, lower extremity edema, claudication, or rash.  All other systems reviewed and negative   General: Affect appropriate Healthy:  appears stated age 73: normal Neck supple with no adenopathy JVP normal no bruits no thyromegaly Lungs clear with no wheezing and good diaphragmatic motion Heart:  S1/S2 no murmur,rub, gallop or click PMI normal Abdomen: benighn, BS positve, no tenderness, no AAA  No femoral bruits   J-tube in Gallblader  no bruit.  No HSM or HJR Unable to feel PT/DP bilaterally  No edema Neuro non-focal Skin warm and dry No muscular weakness  Medications Current Outpatient Prescriptions  Medication Sig Dispense Refill  . albuterol (PROVENTIL HFA;VENTOLIN HFA) 108 (90 BASE) MCG/ACT inhaler Inhale 1-2 puffs into the lungs every 6 (six) hours as needed for wheezing.  1 Inhaler  5  . aspirin EC 325 MG EC tablet Take 1 tablet (325 mg total) by mouth daily.  30 tablet  0  . clopidogrel (PLAVIX) 75 MG tablet Take 75 mg by mouth every evening.      . diltiazem (CARDIZEM) 120 MG tablet Take 120 mg by mouth 2 (two) times daily.      .  finasteride (PROSCAR) 5 MG tablet Take 5 mg by mouth every evening.       Marland Kitchen omeprazole (PRILOSEC) 20 MG capsule Take 20 mg by mouth every morning.      . pravastatin (PRAVACHOL) 40 MG tablet Take 20 mg by mouth every evening. Takes half tab daily      . tamsulosin (FLOMAX) 0.4 MG CAPS capsule Take 0.4 mg by mouth daily after supper.       No current facility-administered medications for this visit.    Allergies Hydrochlorothiazide and Benazepril hcl  Family History: Family History  Problem Relation Age of Onset  . Heart attack Mother   . Heart disease Mother   . Hyperlipidemia Mother   . Stroke Father   . Heart attack Father   . Heart disease Father   . Hyperlipidemia Father   . Hypertension Father   . Other Father     varicose veins  . Heart attack Brother   . Prostate cancer Brother   . Cancer Brother     lung  . Other Brother     varicose veins  . Heart attack Brother   . Heart disease Brother   . Hyperlipidemia Brother   . Other Brother     varicose veins  . Breast cancer Sister   . Diabetes Sister   . Cancer Sister     stomach  . Heart disease Brother   . Hyperlipidemia Brother   . Hypertension  Brother   . Heart attack Brother     Social History: History   Social History  . Marital Status: Married    Spouse Name: N/A    Number of Children: N/A  . Years of Education: N/A   Occupational History  . Not on file.   Social History Main Topics  . Smoking status: Former Smoker -- 3.00 packs/day for 30 years    Types: Cigarettes    Quit date: 02/28/1982  . Smokeless tobacco: Never Used  . Alcohol Use: 0.0 oz/week     Comment: 07/16/2013 "used to drink a couple beers/night; stopped  02/2012"  . Drug Use: No  . Sexual Activity: Not Currently   Other Topics Concern  . Not on file   Social History Narrative   Married 1959   Retired from Writer at [employer]   Enjoys fishing   1 daughter and 1 son    Electrocardiogram:  SR nonspecific ST/T wave  changes   Assessment and Plan

## 2013-09-11 NOTE — CV Procedure (Signed)
   Cardiac Catheterization Procedure Note  Name: [name] MRN: [id number] DOB: Feb 02, 1941  Procedure: Left Heart Cath, Selective Coronary Angiography, LV angiography  Indication:  Preop Clearance abnormal myovue with inferior ischemia   Procedural Details: The right wrist was prepped, draped, and anesthetized with 1% lidocaine. Using the modified Seldinger technique, a 5 French sheath was introduced into the right radial artery. 3 mg of verapamil was administered through the sheath, weight-based unfractionated heparin was administered intravenously. Standard Judkins catheters were used for selective coronary angiography and left ventriculography. Catheter exchanges were performed over an exchange length guidewire. There were no immediate procedural complications. A TR band was used for radial hemostasis at the completion of the procedure.  The patient was transferred to the post catheterization recovery area for further monitoring.  Coronary Arteries: Left  dominant with no anomalies  LM: Normal   LAD: 20-30% proximal and mid  D1- Small no significant disease  D2- Small no significant disease   Circumflex: large and dominant 30% tubular in proximal portion  OM1- Small diffusely diseased  OM2- large vessel 90% ostial stenosis  OM3- small vessel no significant disease Left sided PDA/PLA long segment 70-80%  Multiple discrete lesions   RCA: Small non dominant 70% distal    Ventriculography: EF: 60 %,  No RWMA;s   Hemodynamics:  Aortic Pressure: 157/68  mmHg  LV Pressure: 161/11  mmHg   Final Conclusions:  2V CAD  Recommendations:  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  PDA likely responsible for myovue abnormality  Films reviewed with Dr Angelena Form Both agree his best option for getting through definitive GB surgery is medical Rx  Multiple lesions in small PDA and ostial OM would not respond well to stenting with BMS and would have to postpone  surgery for at least 3 months.  Beta blocker , nitrates.  Call [facility] heartcare to help manage postoperatively  Tight BP control   Tolerated procedure well  [name] 09/11/2013, 9:56 AM

## 2013-09-11 NOTE — Discharge Instructions (Signed)

## 2013-09-12 ENCOUNTER — Telehealth: Payer: Self-pay | Admitting: Cardiovascular Disease

## 2013-09-12 NOTE — Telephone Encounter (Signed)
New Message:  Pt's wife is asking what percentage were the pt's two blockages. Also, Mrs Ursua is asking if her husband needs a sooner appt than his May follow up. She is requesting a call back from the nurse.

## 2013-09-12 NOTE — Telephone Encounter (Signed)
Wife called wanting to know the amount of blockage that was noted in cath.  Gave her the percentages.  Also made him an appointment w/[facility] for follow up from Cath on 3/24.  Wife verbalizes understanding.  States she will call Dr. Brantley Stage' s office to schedule his GB surgery.

## 2013-09-13 ENCOUNTER — Telehealth (INDEPENDENT_AMBULATORY_CARE_PROVIDER_SITE_OTHER): Payer: Self-pay

## 2013-09-13 ENCOUNTER — Encounter: Payer: Self-pay | Admitting: *Deleted

## 2013-09-13 ENCOUNTER — Telehealth: Payer: Self-pay | Admitting: *Deleted

## 2013-09-13 ENCOUNTER — Telehealth: Payer: Self-pay | Admitting: Cardiovascular Disease

## 2013-09-13 MED ORDER — CARVEDILOL 6.25 MG PO TABS: 6.2500 mg | ORAL_TABLET | Freq: Two times a day (BID) | ORAL | Status: DC

## 2013-09-13 MED ORDER — ISOSORBIDE MONONITRATE ER 30 MG PO TB24: 30.0000 mg | ORAL_TABLET | Freq: Every day | ORAL | Status: DC

## 2013-09-13 NOTE — Telephone Encounter (Signed)
CLEARANCE NOTE  SENT TO  DR  CORNETT VIA EPIC./CY

## 2013-09-13 NOTE — Telephone Encounter (Signed)
New message    Wife calling stating what's in the epic system . Wording need to say patient is clear for surgery.

## 2013-09-13 NOTE — Telephone Encounter (Signed)
Wife returned [name]'s call.  I explained to her that we need documentation from Dr. Johnsie Cancel that specifically states the patient is cleared for surgery.  There is a note in Epic from his nurse, but the note does not state [name] is cleared.  [name]'s wife will call Dr. Kyla Balzarine office and make this request.  Once we have the note we can schedule the surgery.

## 2013-09-13 NOTE — Telephone Encounter (Signed)
LM> no note in epic stating pt cleared for surgery. Only note for cardiology I see says pt needs to wait 3 months until he can have surgery. Cardiology is going to have to supply a note for clearance.

## 2013-09-13 NOTE — Telephone Encounter (Signed)
Received clearance letter. Called pt to remind him to stop plavix and asa. Pt understands and will await schedulers to call him.

## 2013-09-13 NOTE — Telephone Encounter (Signed)
Message copied by Carlene Coria on Fri Sep 13, 2013  9:10 AM ------      Message from: Salvatore Marvel      Created: Thu Sep 12, 2013  2:51 PM      Regarding: Dr. Dessa Phi Lap chole surgery      Contact: [phone]       Patient's wife [name] called and states [name] had a Left Heart Catheterization yesterday 09/11/13 and Dr. Johnsie Cancel (Cardiologist) told her is okay to have the laparoscopic cholecystectomy with       Dr. Brantley Stage.and can see notes in Epic, if has any questions can call her.            Thank you.       ------

## 2013-09-13 NOTE — Telephone Encounter (Signed)
Call in coreg 6.25 bid and imdur 30 mg Stopping cardizem Need to stop plavix 5-7 days before surgery F/U with me 4 weeks post op  PT'S  WIFE  AWARE OF  ABOVE     .Adonis Housekeeper

## 2013-09-16 ENCOUNTER — Telehealth: Payer: Self-pay

## 2013-09-16 MED ORDER — ALBUTEROL SULFATE HFA 108 (90 BASE) MCG/ACT IN AERS: 1.0000 | INHALATION_SPRAY | Freq: Four times a day (QID) | RESPIRATORY_TRACT | Status: DC | PRN

## 2013-09-16 NOTE — Telephone Encounter (Signed)
Thanks.  Both sent.

## 2013-09-16 NOTE — Telephone Encounter (Signed)
[name] left v/m; pts ins does not cover Proventil inhaler and [name] wants to know if another inhaler could be sent to [facility] in [place]; pt has not had inhaler for sometime and pt having slight problem breathing, not having enough problem that needs to be seen; pt recently had stress test and has 2 blockages and will have that looked at after has cholestectomy. [name] at [facility] in [place] said if Dr Damita Dunnings wants to call in  [facility] she will try to get them to go thru and would choose the one ins will approve. [name] request cb when sent to pharmacy.

## 2013-09-16 NOTE — Telephone Encounter (Signed)
Patient advised.

## 2013-09-17 ENCOUNTER — Inpatient Hospital Stay (HOSPITAL_COMMUNITY): Payer: Medicare Other

## 2013-09-17 ENCOUNTER — Telehealth: Payer: Self-pay | Admitting: Family Medicine

## 2013-09-17 ENCOUNTER — Encounter (HOSPITAL_COMMUNITY): Payer: Self-pay | Admitting: Emergency Medicine

## 2013-09-17 ENCOUNTER — Emergency Department (HOSPITAL_COMMUNITY): Payer: Medicare Other

## 2013-09-17 ENCOUNTER — Telehealth (INDEPENDENT_AMBULATORY_CARE_PROVIDER_SITE_OTHER): Payer: Self-pay

## 2013-09-17 ENCOUNTER — Inpatient Hospital Stay (HOSPITAL_COMMUNITY)
Admission: EM | Admit: 2013-09-17 | Discharge: 2013-09-23 | DRG: 853 | Disposition: A | Discharge status: Home Health | Payer: Medicare Other | Attending: Internal Medicine | Admitting: Internal Medicine

## 2013-09-17 DIAGNOSIS — Z7982 Long term (current) use of aspirin: Secondary | ICD-10-CM

## 2013-09-17 DIAGNOSIS — I868 Varicose veins of other specified sites: Secondary | ICD-10-CM

## 2013-09-17 DIAGNOSIS — Z7902 Long term (current) use of antithrombotics/antiplatelets: Secondary | ICD-10-CM

## 2013-09-17 DIAGNOSIS — I70219 Atherosclerosis of native arteries of extremities with intermittent claudication, unspecified extremity: Secondary | ICD-10-CM | POA: Diagnosis not present

## 2013-09-17 DIAGNOSIS — K819 Cholecystitis, unspecified: Secondary | ICD-10-CM | POA: Diagnosis not present

## 2013-09-17 DIAGNOSIS — Z79899 Other long term (current) drug therapy: Secondary | ICD-10-CM | POA: Diagnosis not present

## 2013-09-17 DIAGNOSIS — R5383 Other fatigue: Secondary | ICD-10-CM

## 2013-09-17 DIAGNOSIS — I2510 Atherosclerotic heart disease of native coronary artery without angina pectoris: Secondary | ICD-10-CM

## 2013-09-17 DIAGNOSIS — I739 Peripheral vascular disease, unspecified: Secondary | ICD-10-CM

## 2013-09-17 DIAGNOSIS — A4151 Sepsis due to Escherichia coli [E. coli]: Secondary | ICD-10-CM | POA: Diagnosis present

## 2013-09-17 DIAGNOSIS — R05 Cough: Secondary | ICD-10-CM

## 2013-09-17 DIAGNOSIS — Z860100 Personal history of colon polyps, unspecified: Secondary | ICD-10-CM

## 2013-09-17 DIAGNOSIS — K8010 Calculus of gallbladder with chronic cholecystitis without obstruction: Secondary | ICD-10-CM | POA: Diagnosis present

## 2013-09-17 DIAGNOSIS — R4182 Altered mental status, unspecified: Secondary | ICD-10-CM

## 2013-09-17 DIAGNOSIS — K219 Gastro-esophageal reflux disease without esophagitis: Secondary | ICD-10-CM | POA: Diagnosis present

## 2013-09-17 DIAGNOSIS — E785 Hyperlipidemia, unspecified: Secondary | ICD-10-CM | POA: Diagnosis present

## 2013-09-17 DIAGNOSIS — Z01810 Encounter for preprocedural cardiovascular examination: Secondary | ICD-10-CM

## 2013-09-17 DIAGNOSIS — K8309 Other cholangitis: Secondary | ICD-10-CM

## 2013-09-17 DIAGNOSIS — K5730 Diverticulosis of large intestine without perforation or abscess without bleeding: Secondary | ICD-10-CM | POA: Diagnosis present

## 2013-09-17 DIAGNOSIS — I714 Abdominal aortic aneurysm, without rupture, unspecified: Secondary | ICD-10-CM | POA: Diagnosis not present

## 2013-09-17 DIAGNOSIS — H9190 Unspecified hearing loss, unspecified ear: Secondary | ICD-10-CM

## 2013-09-17 DIAGNOSIS — J438 Other emphysema: Secondary | ICD-10-CM | POA: Diagnosis present

## 2013-09-17 DIAGNOSIS — Z8249 Family history of ischemic heart disease and other diseases of the circulatory system: Secondary | ICD-10-CM | POA: Diagnosis not present

## 2013-09-17 DIAGNOSIS — R509 Fever, unspecified: Secondary | ICD-10-CM

## 2013-09-17 DIAGNOSIS — I10 Essential (primary) hypertension: Secondary | ICD-10-CM

## 2013-09-17 DIAGNOSIS — R0989 Other specified symptoms and signs involving the circulatory and respiratory systems: Secondary | ICD-10-CM

## 2013-09-17 DIAGNOSIS — M549 Dorsalgia, unspecified: Secondary | ICD-10-CM

## 2013-09-17 DIAGNOSIS — A4150 Gram-negative sepsis, unspecified: Secondary | ICD-10-CM

## 2013-09-17 DIAGNOSIS — R197 Diarrhea, unspecified: Secondary | ICD-10-CM

## 2013-09-17 DIAGNOSIS — A419 Sepsis, unspecified organism: Secondary | ICD-10-CM | POA: Diagnosis present

## 2013-09-17 DIAGNOSIS — R21 Rash and other nonspecific skin eruption: Secondary | ICD-10-CM

## 2013-09-17 DIAGNOSIS — R059 Cough, unspecified: Secondary | ICD-10-CM

## 2013-09-17 DIAGNOSIS — G9340 Encephalopathy, unspecified: Secondary | ICD-10-CM | POA: Diagnosis present

## 2013-09-17 DIAGNOSIS — R1110 Vomiting, unspecified: Secondary | ICD-10-CM

## 2013-09-17 DIAGNOSIS — R112 Nausea with vomiting, unspecified: Secondary | ICD-10-CM

## 2013-09-17 DIAGNOSIS — E041 Nontoxic single thyroid nodule: Secondary | ICD-10-CM

## 2013-09-17 DIAGNOSIS — Z87891 Personal history of nicotine dependence: Secondary | ICD-10-CM | POA: Diagnosis not present

## 2013-09-17 LAB — URINALYSIS, ROUTINE W REFLEX MICROSCOPIC
Glucose, UA: NEGATIVE mg/dL
HGB URINE DIPSTICK: NEGATIVE
Ketones, ur: NEGATIVE mg/dL
Leukocytes, UA: NEGATIVE
NITRITE: NEGATIVE
PH: 6.5 (ref 5.0–8.0)
Protein, ur: NEGATIVE mg/dL
Specific Gravity, Urine: 1.014 (ref 1.005–1.030)
UROBILINOGEN UA: 2 mg/dL — AB (ref 0.0–1.0)

## 2013-09-17 LAB — COMPREHENSIVE METABOLIC PANEL
ALK PHOS: 236 U/L — AB (ref 39–117)
ALT: 253 U/L — AB (ref 0–53)
AST: 253 U/L — ABNORMAL HIGH (ref 0–37)
Albumin: 3.5 g/dL (ref 3.5–5.2)
BUN: 16 mg/dL (ref 6–23)
CO2: 24 meq/L (ref 19–32)
Calcium: 9.5 mg/dL (ref 8.4–10.5)
Chloride: 96 mEq/L (ref 96–112)
Creatinine, Ser: 0.83 mg/dL (ref 0.50–1.35)
GFR calc non Af Amer: 85 mL/min — ABNORMAL LOW (ref >90)
Glucose, Bld: 126 mg/dL — ABNORMAL HIGH (ref 70–99)
POTASSIUM: 3.9 meq/L (ref 3.7–5.3)
SODIUM: 137 meq/L (ref 137–147)
TOTAL PROTEIN: 7.1 g/dL (ref 6.0–8.3)
Total Bilirubin: 6 mg/dL — ABNORMAL HIGH (ref 0.3–1.2)

## 2013-09-17 LAB — I-STAT TROPONIN, ED: TROPONIN I, POC: 0.02 ng/mL (ref 0.00–0.08)

## 2013-09-17 LAB — I-STAT CHEM 8, ED
BUN: 15 mg/dL (ref 6–23)
CALCIUM ION: 1.1 mmol/L — AB (ref 1.13–1.30)
Chloride: 98 mEq/L (ref 96–112)
Creatinine, Ser: 1 mg/dL (ref 0.50–1.35)
GLUCOSE: 128 mg/dL — AB (ref 70–99)
HEMATOCRIT: 44 % (ref 39.0–52.0)
Hemoglobin: 15 g/dL (ref 13.0–17.0)
Potassium: 3.7 mEq/L (ref 3.7–5.3)
Sodium: 138 mEq/L (ref 137–147)
TCO2: 25 mmol/L (ref 0–100)

## 2013-09-17 LAB — PROTIME-INR
INR: 1.19 (ref 0.00–1.49)
PROTHROMBIN TIME: 14.8 s (ref 11.6–15.2)

## 2013-09-17 LAB — CBC WITH DIFFERENTIAL/PLATELET
Basophils Absolute: 0 10*3/uL (ref 0.0–0.1)
Basophils Relative: 0 % (ref 0–1)
Eosinophils Absolute: 0 10*3/uL (ref 0.0–0.7)
Eosinophils Relative: 0 % (ref 0–5)
HCT: 41.3 % (ref 39.0–52.0)
HEMOGLOBIN: 14.2 g/dL (ref 13.0–17.0)
LYMPHS ABS: 0.2 10*3/uL — AB (ref 0.7–4.0)
LYMPHS PCT: 2 % — AB (ref 12–46)
MCH: 29.6 pg (ref 26.0–34.0)
MCHC: 34.4 g/dL (ref 30.0–36.0)
MCV: 86 fL (ref 78.0–100.0)
MONOS PCT: 2 % — AB (ref 3–12)
Monocytes Absolute: 0.2 10*3/uL (ref 0.1–1.0)
NEUTROS ABS: 10.1 10*3/uL — AB (ref 1.7–7.7)
NEUTROS PCT: 97 % — AB (ref 43–77)
PLATELETS: 113 10*3/uL — AB (ref 150–400)
RBC: 4.8 MIL/uL (ref 4.22–5.81)
RDW: 13.8 % (ref 11.5–15.5)
WBC: 10.5 10*3/uL (ref 4.0–10.5)

## 2013-09-17 LAB — LACTIC ACID, PLASMA: LACTIC ACID, VENOUS: 1 mmol/L (ref 0.5–2.2)

## 2013-09-17 LAB — I-STAT ARTERIAL BLOOD GAS, ED
Bicarbonate: 24.3 mEq/L — ABNORMAL HIGH (ref 20.0–24.0)
O2 Saturation: 95 %
TCO2: 26 mmol/L (ref 0–100)
pCO2 arterial: 39.1 mmHg (ref 35.0–45.0)
pH, Arterial: 7.402 (ref 7.350–7.450)
pO2, Arterial: 76 mmHg — ABNORMAL LOW (ref 80.0–100.0)

## 2013-09-17 LAB — CBG MONITORING, ED: GLUCOSE-CAPILLARY: 110 mg/dL — AB (ref 70–99)

## 2013-09-17 LAB — PRO B NATRIURETIC PEPTIDE: Pro B Natriuretic peptide (BNP): 756.4 pg/mL — ABNORMAL HIGH (ref 0–125)

## 2013-09-17 LAB — I-STAT CG4 LACTIC ACID, ED
Lactic Acid, Venous: 1.37 mmol/L (ref 0.5–2.2)
Lactic Acid, Venous: 0.97 mmol/L (ref 0.5–2.2)

## 2013-09-17 LAB — TROPONIN I: Troponin I: <0.3 ng/mL (ref <0.30)

## 2013-09-17 LAB — LIPASE, BLOOD: Lipase: 19 U/L (ref 11–59)

## 2013-09-17 MED ORDER — MORPHINE SULFATE 2 MG/ML IJ SOLN
2.0000 mg | INTRAMUSCULAR | Status: DC | PRN
  Administered 2013-09-20: 2 mg via INTRAVENOUS
  Filled 2013-09-17: qty 1

## 2013-09-17 MED ORDER — SODIUM CHLORIDE 0.9 % IV SOLN
1000.0000 mL | INTRAVENOUS | Status: DC
  Administered 2013-09-17: 1000 mL via INTRAVENOUS

## 2013-09-17 MED ORDER — SODIUM CHLORIDE 0.9 % IV SOLN
INTRAVENOUS | Status: DC
  Administered 2013-09-17 – 2013-09-18 (×2): via INTRAVENOUS
  Administered 2013-09-20: 20 mL/h via INTRAVENOUS

## 2013-09-17 MED ORDER — ACETAMINOPHEN 325 MG PO TABS
650.0000 mg | ORAL_TABLET | Freq: Four times a day (QID) | ORAL | Status: DC | PRN
  Administered 2013-09-18 – 2013-09-22 (×2): 650 mg via ORAL
  Filled 2013-09-17 (×2): qty 2

## 2013-09-17 MED ORDER — ONDANSETRON HCL 4 MG PO TABS: 4.0000 mg | ORAL_TABLET | Freq: Four times a day (QID) | ORAL | Status: DC | PRN

## 2013-09-17 MED ORDER — ENOXAPARIN SODIUM 40 MG/0.4ML ~~LOC~~ SOLN
40.0000 mg | SUBCUTANEOUS | Status: DC
  Administered 2013-09-17 – 2013-09-22 (×4): 40 mg via SUBCUTANEOUS
  Filled 2013-09-17 (×8): qty 0.4

## 2013-09-17 MED ORDER — VANCOMYCIN HCL IN DEXTROSE 1-5 GM/200ML-% IV SOLN
1000.0000 mg | Freq: Two times a day (BID) | INTRAVENOUS | Status: DC
  Administered 2013-09-18: 1000 mg via INTRAVENOUS
  Filled 2013-09-17 (×2): qty 200

## 2013-09-17 MED ORDER — SODIUM CHLORIDE 0.9 % IV SOLN
1000.0000 mL | Freq: Once | INTRAVENOUS | Status: AC
  Administered 2013-09-17: 1000 mL via INTRAVENOUS

## 2013-09-17 MED ORDER — VANCOMYCIN HCL 10 G IV SOLR
2000.0000 mg | Freq: Once | INTRAVENOUS | Status: AC
  Administered 2013-09-17: 2000 mg via INTRAVENOUS
  Filled 2013-09-17: qty 2000

## 2013-09-17 MED ORDER — SODIUM CHLORIDE 0.9 % IJ SOLN
3.0000 mL | Freq: Two times a day (BID) | INTRAMUSCULAR | Status: DC
  Administered 2013-09-17 – 2013-09-23 (×7): 3 mL via INTRAVENOUS

## 2013-09-17 MED ORDER — ALUM & MAG HYDROXIDE-SIMETH 200-200-20 MG/5ML PO SUSP: 30.0000 mL | Freq: Four times a day (QID) | ORAL | Status: DC | PRN

## 2013-09-17 MED ORDER — ACETAMINOPHEN 650 MG RE SUPP: 650.0000 mg | Freq: Four times a day (QID) | RECTAL | Status: DC | PRN

## 2013-09-17 MED ORDER — OXYCODONE HCL 5 MG PO TABS
5.0000 mg | ORAL_TABLET | ORAL | Status: DC | PRN
  Administered 2013-09-22: 5 mg via ORAL
  Filled 2013-09-17 (×2): qty 1

## 2013-09-17 MED ORDER — SODIUM CHLORIDE 0.9 % IV SOLN
INTRAVENOUS | Status: AC
  Administered 2013-09-17: 18:00:00 via INTRAVENOUS

## 2013-09-17 MED ORDER — ONDANSETRON HCL 4 MG/2ML IJ SOLN: 4.0000 mg | Freq: Four times a day (QID) | INTRAMUSCULAR | Status: DC | PRN

## 2013-09-17 MED ORDER — SODIUM CHLORIDE 0.9 % IV BOLUS (SEPSIS): 1000.0000 mL | Freq: Once | INTRAVENOUS | Status: DC

## 2013-09-17 MED ORDER — VANCOMYCIN HCL IN DEXTROSE 1-5 GM/200ML-% IV SOLN: 1000.0000 mg | Freq: Once | INTRAVENOUS | Status: DC

## 2013-09-17 MED ORDER — IOHEXOL 300 MG/ML  SOLN
25.0000 mL | INTRAMUSCULAR | Status: DC
  Administered 2013-09-17: 25 mL via ORAL

## 2013-09-17 MED ORDER — PIPERACILLIN-TAZOBACTAM 3.375 G IVPB 30 MIN
3.3750 g | Freq: Once | INTRAVENOUS | Status: AC
  Administered 2013-09-17: 3.375 g via INTRAVENOUS
  Filled 2013-09-17: qty 50

## 2013-09-17 MED ORDER — DEXTROSE 5 % IV SOLN
2.0000 g | Freq: Two times a day (BID) | INTRAVENOUS | Status: DC
  Administered 2013-09-17 – 2013-09-23 (×11): 2 g via INTRAVENOUS
  Filled 2013-09-17 (×13): qty 2

## 2013-09-17 MED ORDER — ACETAMINOPHEN 650 MG RE SUPP
650.0000 mg | Freq: Once | RECTAL | Status: AC
  Administered 2013-09-17: 650 mg via RECTAL
  Filled 2013-09-17: qty 1

## 2013-09-17 MED ORDER — ONDANSETRON HCL 4 MG/2ML IJ SOLN
4.0000 mg | Freq: Once | INTRAMUSCULAR | Status: AC
  Administered 2013-09-17: 4 mg via INTRAVENOUS
  Filled 2013-09-17: qty 2

## 2013-09-17 MED ORDER — ACETAMINOPHEN 325 MG PO TABS: 650.0000 mg | ORAL_TABLET | Freq: Once | ORAL | Status: DC

## 2013-09-17 MED ORDER — PIPERACILLIN-TAZOBACTAM 3.375 G IVPB
3.3750 g | Freq: Three times a day (TID) | INTRAVENOUS | Status: DC
  Filled 2013-09-17 (×2): qty 50

## 2013-09-17 NOTE — ED Notes (Signed)
Pt arrives with EMS with complaints of generalized weakness that began this morning at 0830. Ambulatory prior. Gradual onset, vision problems present, stated that he felt as if the road was moving. Emesis before EMS arrival. Has gall bladder cath, procedure was 07/15/13. EMS VS 178/80, HR132, sinus tach, CBG 140, T 99.9. Pt normally incont of urine. Pt presently lethargic, does not respond to commands.

## 2013-09-17 NOTE — Consult Note (Signed)
[name] 1940-10-17  [id number].    Requesting MD: Rancour, S Chief Complaint/Reason for Consult: Sepsis s/p gall bladder 07/15/13 HPI:  [name] is a 73 y.o. male with a complicated medical history including HTN, Diverticulosis, peripheral vascular disease and Emphysema. Pt had a gall bladder catheter placed on 07/15/13. Per pts wife, pt has been feeling fatigued and not quite himself the last few days.  Pt went to eat breakfast with a friend and came back to the house complaining of "feeling sick" and abdominal pain.  Pts wife called [facility] office and was advised to seek treatment at [facility]. Pt had several episodes of nausea with emesis that mainly consisted of undigested food.  +fevers/chills.  Pts wife denies any urinary changes, bowel changes, recent illness in the past few days.  Pts wife states he has had an altered mental status since becoming ill this morning. When asked to describe further, she states he would "barely talk to her" and was unable to stand, leading her to call EMS for transport to the hospital.  She denies any difficulties with his drain and seems to be draining well.  She states he was seen recently for a heart catheterization that showed 2 blockages.   ROS: All systems reviewed and otherwise negative except for as above, as able with pts current mental status.  Family History  Problem Relation Age of Onset  . Heart attack Mother   . Heart disease Mother   . Hyperlipidemia Mother   . Stroke Father   . Heart attack Father   . Heart disease Father   . Hyperlipidemia Father   . Hypertension Father   . Other Father     varicose veins  . Heart attack Brother   . Prostate cancer Brother   . Cancer Brother     lung  . Other Brother     varicose veins  . Heart attack Brother   . Heart disease Brother   . Hyperlipidemia Brother   . Other Brother     varicose veins  . Breast cancer Sister   . Diabetes Sister   . Cancer Sister     stomach  . Heart disease  Brother   . Hyperlipidemia Brother   . Hypertension Brother   . Heart attack Brother     Past Medical History  Diagnosis Date  . Hyperlipidemia   . Hypertension   . OAD (obstructive airway disease)   . GERD (gastroesophageal reflux disease)   . Diverticulosis   . ED (erectile dysfunction)     no relief with cialis, etc  . Back pain     per [facility]  . Thyroid nodule   . Anemia   . Irregular heartbeat   . Peripheral vascular disease   . Bronchitis   . AAA (abdominal aortic aneurysm)   . Emphysema     "has a little bit" (07/16/2013)  . Pneumonia     "twice since 1959; once when he was a child" (07/16/2013)  . Exertional shortness of breath   . DDD (degenerative disc disease)     cervical, followed by [facility] 2012    Past Surgical History  Procedure Laterality Date  . Small intestine surgery      "blockage" (07/16/2013)  . Skin graft Left     "hand"  . Tonsillectomy    . Colonoscopy w/ polypectomy    . Aortogram  Aug. 1, 2013  . Colectomy  1961  . Iliac artery stent Right 02/2012    "  Dr. Bridgett Larsson"    Social History:  reports that he quit smoking about 31 years ago. His smoking use included Cigarettes. He has a 90 pack-year smoking history. He has never used smokeless tobacco. He reports that he drinks alcohol. He reports that he does not use illicit drugs.  Allergies:  Allergies  Allergen Reactions  . Hydrochlorothiazide Other (See Comments)    REACTION: joint pain  . Benazepril Hcl Other (See Comments)    unknown     (Not in a hospital admission)  Blood pressure 155/64, pulse 135, temperature 104.4 F (40.2 C), temperature source Rectal, resp. rate 22, height 5' 10.08" (1.78 m), weight 199 lb 15.3 oz (90.7 kg), SpO2 97.00%.  Body mass index is 28.63 kg/(m^2).   Physical Exam: General: Lethargic, WD/WN white male who is laying in bed, diaphoretic HEENT: head is normocephalic, atraumatic.  Sclera are noninjected.  PERRL.  Ears and nose without any masses or lesions.   Mouth is dry. Heart: Sinus tachycardia.  No obvious murmurs, gallops, or rubs noted.  Palpable pedal pulses bilaterally and radial pulses. Lungs: Tachypneic, CTAB, no wheezes, rhonchi, or rales noted.  Respiratory effort noted to be shallow. Abd: soft, overweight, NT/ND, +BS, Small ventral hernia just above umbilicus. GB drain in RUQ draining bilious brown fluid, covered with dressing. Ext: Varicose veins bilteral LEs.  Trophic nail changes.  Non edematous, non erythematous. Skin: Hot to the touch. Psych: A&Ox1. Oriented to wife.  Able to follow simple commands after repeated prompts. Neuro: Normal Speech.  No facial droop.  Possible weakness to left hand compared to right grip strength, Gross UE and LE motor intact.    Results for orders placed during the hospital encounter of 09/17/13 (from the past 48 hour(s))  CBC WITH DIFFERENTIAL     Status: Abnormal   Collection Time    09/17/13  2:59 PM      Result Value Ref Range   WBC 10.5  4.0 - 10.5 K/uL   RBC 4.80  4.22 - 5.81 MIL/uL   Hemoglobin 14.2  13.0 - 17.0 g/dL   HCT 41.3  39.0 - 52.0 %   MCV 86.0  78.0 - 100.0 fL   MCH 29.6  26.0 - 34.0 pg   MCHC 34.4  30.0 - 36.0 g/dL   RDW 13.8  11.5 - 15.5 %   Platelets 113 (*) 150 - 400 K/uL   Neutrophils Relative % 97 (*) 43 - 77 %   Neutro Abs 10.1 (*) 1.7 - 7.7 K/uL   Lymphocytes Relative 2 (*) 12 - 46 %   Lymphs Abs 0.2 (*) 0.7 - 4.0 K/uL   Monocytes Relative 2 (*) 3 - 12 %   Monocytes Absolute 0.2  0.1 - 1.0 K/uL   Eosinophils Relative 0  0 - 5 %   Eosinophils Absolute 0.0  0.0 - 0.7 K/uL   Basophils Relative 0  0 - 1 %   Basophils Absolute 0.0  0.0 - 0.1 K/uL  I-STAT TROPOININ, ED     Status: None   Collection Time    09/17/13  3:25 PM      Result Value Ref Range   Troponin i, poc 0.02  0.00 - 0.08 ng/mL   Comment 3            Comment: Due to the release kinetics of cTnI,     a negative result within the first hours     of the onset of symptoms does not rule out  myocardial  infarction with certainty.     If myocardial infarction is still suspected,     repeat the test at appropriate intervals.  I-STAT CG4 LACTIC ACID, ED     Status: None   Collection Time    09/17/13  3:27 PM      Result Value Ref Range   Lactic Acid, Venous 1.37  0.5 - 2.2 mmol/L  I-STAT CHEM 8, ED     Status: Abnormal   Collection Time    09/17/13  3:37 PM      Result Value Ref Range   Sodium 138  137 - 147 mEq/L   Potassium 3.7  3.7 - 5.3 mEq/L   Chloride 98  96 - 112 mEq/L   BUN 15  6 - 23 mg/dL   Creatinine, Ser 1.00  0.50 - 1.35 mg/dL   Glucose, Bld 128 (*) 70 - 99 mg/dL   Calcium, Ion 1.10 (*) 1.13 - 1.30 mmol/L   TCO2 25  0 - 100 mmol/L   Hemoglobin 15.0  13.0 - 17.0 g/dL   HCT 44.0  39.0 - 52.0 %   Dg Chest Port 1 View  09/17/2013   CLINICAL DATA:  Sepsis, weakness  EXAM: PORTABLE CHEST - 1 VIEW  COMPARISON:  DG CHEST 1 VIEW dated 07/17/2013; DG CHEST 2 VIEW dated 07/15/2013; DG CHEST 2 VIEW dated 06/21/2012  FINDINGS: The heart size and mediastinal contours are within normal limits. Both lungs are clear. The visualized skeletal structures are unremarkable.  IMPRESSION: No active disease.   Electronically Signed   By: Skipper Cliche M.D.   On: 09/17/2013 15:35      Assessment/Plan Sepsis Altered Mental Status Nausea/vomiting Fever Generalized Weakness S/p per chole tube   1. Admit to medicine, will follow along, may need cards given recent cath and cardiac workup 2. Does not appear to be a surgical problem at this time given benign abdominal exam and perc chole tube seems to be draining properly.   3. IVF, pain control, antiemetics, antibiotics (Zosyn and Vanc per pharmacy) 4. Will await further septic workup.    Barrington Ellison, PA-S [facility] Surgery 09/17/2013, 3:44 PM Pager: [phone]  ----------------------------------------------------------------------------------------------------------- General Surgery PA Preceptor Note:  I agree  with the above PA students findings as above.    Coralie Keens, PA-C General Surgery [facility] Surgery Pager: [phone] Office: [phone]

## 2013-09-17 NOTE — Telephone Encounter (Signed)
Patient Information:  Caller Name: [name]  Phone: [phone]  Patient: [name]  Gender: Male  DOB: 1940-10-08  Age: 73 Years  PCP: [name] Brigitte Pulse) [facility])  Office Follow Up:  Does the office need to follow up with this patient?: No  Instructions For The Office: N/A   Symptoms  Reason For Call & Symptoms: Wife calling, he has known gall bladder disease  and has an external drainage tube.  This morning he went out with a friend and ate less then 1/2 scrabbled egg sandwich.  He developed nausea and is not vomiting.  He is also having chills and she checked his temp and he is afebrile.  The drainage tube has had a decrease in drainage over the past week.  Denies any pain.  He is pale in color.  The vomit was yellow in color (like the egg sandwich).    I called Dr. Josetta Huddle office [facility] Surgery) and talked with Jenny Reichmann.  Dr. Brantley Stage is on call and the hospitalist this week at [facility].  She will make him aware of patient symptoms.  [name] denies any chest pain, shortness of breath or discomfort and will take him to the ED at [facility] in [facility].  Reviewed Health History In EMR: Yes  Reviewed Medications In EMR: Yes  Reviewed Allergies In EMR: Yes  Reviewed Surgeries / Procedures: Yes  Date of Onset of Symptoms: 09/17/2013  Guideline(s) Used:  No Protocol Available - Sick Adult  Disposition Per Guideline:   Go to ED Now (or to Office with PCP Approval)  Reason For Disposition Reached:   Nursing judgment  Advice Given:  N/A  Patient Will Follow Care Advice:  YES  TO ED per Dr. Josetta Huddle office instructions

## 2013-09-17 NOTE — ED Notes (Signed)
Spoke with CT and EDP. EDP stated patient does not have to drink and to have both test completed now.

## 2013-09-17 NOTE — H&P (Signed)
[facility] History and Physical  GARRON ELINE NWG:[id number] DOB: Apr 05, 1941 DOA: 09/17/2013  Referring physician:  PCP: [name], MD   Chief Complaint: Fever  HPI: [name] is a 73 y.o. male with multiple comorbidities including coronary artery disease, hypertension, dyslipidemia, morbid obesity, admitted to the surgery service on 07/15/2013, which time he presented with abdominal pain, diagnosed with acute cholecystitis. Given multiple comorbidities, felt to be a high surgical risk for which he underwent percutaneous cholecystostomy drain placement. He was discharged from the surgical service on 07/19/2013. He followed up with cardiology for preoperative clearance. Stress test on her 09/05/2013 revealed reversible ischemia. 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 Cardiology felt that this option for definitive gallbladder surgery would be medical management, as multiple lesions would not respond well to stenting. Today he was noted by his wife to appear acutely ill, lethargic, diaphoretic, having fevers and chills.  Patient reported feeling "sick" but could not give more detail. His wife reports that he may have had some abdominal pain associated with nausea. As above also reports that he became so weak he was unable to stand on his own. In the ED denies chest pain, shortness of breath, palpitations, diarrhea, dysuria, hematemesis, bright red blood per rectum or melena. Patient was seen and evaluated by surgery in the emergency room recommended admission to medicine as patient did not appear to have a acute surgical issue. Currently pending is a CT scan of abdomen and pelvis.                                                         Review of Systems:  Constitutional:  No weight loss, night sweats, Positive for Fevers, chills, fatigue.  HEENT:  No headaches, Difficulty swallowing,Tooth/dental  problems,Sore throat,  No sneezing, itching, ear ache, nasal congestion, post nasal drip,  Cardio-vascular:  No chest pain, Orthopnea, PND, swelling in lower extremities, anasarca, dizziness, palpitations  GI:  No heartburn, indigestion, abdominal pain, nausea, vomiting, diarrhea, change in bowel habits, loss of appetite  Resp:  No shortness of breath with exertion or at rest. No excess mucus, no productive cough, No non-productive cough, No coughing up of blood.No change in color of mucus.No wheezing.No chest wall deformity  Skin:  no rash or lesions.  GU:  no dysuria, change in color of urine, no urgency or frequency. No flank pain.  Musculoskeletal:  No joint pain or swelling. No decreased range of motion. No back pain.  Psych:  No change in mood or affect. No depression or anxiety. No memory loss.   Past Medical History  Diagnosis Date  . Hyperlipidemia   . Hypertension   . OAD (obstructive airway disease)   . GERD (gastroesophageal reflux disease)   . Diverticulosis   . ED (erectile dysfunction)     no relief with cialis, etc  . Back pain     per [facility]  . Thyroid nodule   . Anemia   . Irregular heartbeat   . Peripheral vascular disease   . Bronchitis   . AAA (abdominal aortic aneurysm)   . Emphysema     "has a little bit" (07/16/2013)  . Pneumonia     "twice since 1959; once when  he was a child" (07/16/2013)  . Exertional shortness of breath   . DDD (degenerative disc disease)     cervical, followed by [facility] 2012   Past Surgical History  Procedure Laterality Date  . Small intestine surgery      "blockage" (07/16/2013)  . Skin graft Left     "hand"  . Tonsillectomy    . Colonoscopy w/ polypectomy    . Aortogram  Aug. 1, 2013  . Colectomy  1961  . Iliac artery stent Right 02/2012    "Dr. Bridgett Larsson"   Social History:  reports that he quit smoking about 31 years ago. His smoking use included Cigarettes. He has a 90 pack-year smoking history. He has never used smokeless  tobacco. He reports that he drinks alcohol. He reports that he does not use illicit drugs.  Allergies  Allergen Reactions  . Hydrochlorothiazide Other (See Comments)    REACTION: joint pain  . Benazepril Hcl Other (See Comments)    unknown    Family History  Problem Relation Age of Onset  . Heart attack Mother   . Heart disease Mother   . Hyperlipidemia Mother   . Stroke Father   . Heart attack Father   . Heart disease Father   . Hyperlipidemia Father   . Hypertension Father   . Other Father     varicose veins  . Heart attack Brother   . Prostate cancer Brother   . Cancer Brother     lung  . Other Brother     varicose veins  . Heart attack Brother   . Heart disease Brother   . Hyperlipidemia Brother   . Other Brother     varicose veins  . Breast cancer Sister   . Diabetes Sister   . Cancer Sister     stomach  . Heart disease Brother   . Hyperlipidemia Brother   . Hypertension Brother   . Heart attack Brother      Prior to Admission medications   Medication Sig Start Date End Date Taking? Authorizing Provider  aspirin EC 325 MG EC tablet Take 1 tablet (325 mg total) by mouth daily. 07/19/13  Yes Maryann Mikhail, DO  carvedilol (COREG) 6.25 MG tablet Take 1 tablet (6.25 mg total) by mouth 2 (two) times daily. 09/13/13  Yes Josue Hector, MD  finasteride (PROSCAR) 5 MG tablet Take 5 mg by mouth every evening.  05/21/12  Yes Historical Provider, MD  isosorbide mononitrate (IMDUR) 30 MG 24 hr tablet Take 1 tablet (30 mg total) by mouth daily. 09/13/13  Yes Josue Hector, MD  omeprazole (PRILOSEC) 20 MG capsule Take 20 mg by mouth every morning.   Yes Historical Provider, MD  pravastatin (PRAVACHOL) 40 MG tablet Take 20 mg by mouth every evening. Takes half tab daily   Yes Historical Provider, MD  tamsulosin (FLOMAX) 0.4 MG CAPS capsule Take 0.4 mg by mouth daily after supper.   Yes Historical Provider, MD  clopidogrel (PLAVIX) 75 MG tablet Take 75 mg by mouth every  evening.    Historical Provider, MD   Physical Exam: Filed Vitals:   09/17/13 1800  BP: 106/60  Pulse: 116  Temp:   Resp: 32    BP 106/60  Pulse 116  Temp(Src) 104.4 F (40.2 C) (Rectal)  Resp 32  Ht 5' 10.08" (1.78 m)  Wt 90.7 kg (199 lb 15.3 oz)  BMI 28.63 kg/m2  SpO2 99%  General:  Ill-appearing, diaphoretic, toxic, he is awake and  did respond to a few my questions Eyes: PERRL, normal lids, irises & conjunctiva ENT: grossly normal hearing, dry oral mucosa, no oral lesions Neck: no LAD, masses or thyromegaly Cardiovascular: Tachycardic, RRR, no m/r/g. No LE edema. Telemetry: SR, tachycardic  Respiratory: Tachypnea, having a few bibasal crackles, positive rhonchi Abdomen: Obese soft nontender. Drainage catheter in place, site appears clean no evidence of purulence or erythema. Ventral hernia noted Skin: no rash or induration seen on limited exam Musculoskeletal: grossly normal tone BUE/BLE Psychiatric: grossly normal mood and affect, speech fluent and appropriate Neurologic: grossly non-focal.          Labs on Admission:  Basic Metabolic Panel:  Recent Labs Lab 09/17/13 1459 09/17/13 1537  NA 137 138  K 3.9 3.7  CL 96 98  CO2 24  --   GLUCOSE 126* 128*  BUN 16 15  CREATININE 0.83 1.00  CALCIUM 9.5  --    Liver Function Tests:  Recent Labs Lab 09/17/13 1459  AST 253*  ALT 253*  ALKPHOS 236*  BILITOT 6.0*  PROT 7.1  ALBUMIN 3.5    Recent Labs Lab 09/17/13 1459  LIPASE 19   No results found for this basename: AMMONIA,  in the last 168 hours CBC:  Recent Labs Lab 09/17/13 1459 09/17/13 1537  WBC 10.5  --   NEUTROABS 10.1*  --   HGB 14.2 15.0  HCT 41.3 44.0  MCV 86.0  --   PLT 113*  --    Cardiac Enzymes:  Recent Labs Lab 09/17/13 1459  TROPONINI <0.30    BNP (last 3 results)  Recent Labs  09/17/13 1459  PROBNP 756.4*   CBG:  Recent Labs Lab 09/17/13 1614  GLUCAP 110*    Radiological Exams on Admission: Dg Chest  Port 1 View  09/17/2013   CLINICAL DATA:  Sepsis, weakness  EXAM: PORTABLE CHEST - 1 VIEW  COMPARISON:  DG CHEST 1 VIEW dated 07/17/2013; DG CHEST 2 VIEW dated 07/15/2013; DG CHEST 2 VIEW dated 06/21/2012  FINDINGS: The heart size and mediastinal contours are within normal limits. Both lungs are clear. The visualized skeletal structures are unremarkable.  IMPRESSION: No active disease.   Electronically Signed   By: Esperanza Heir M.D.   On: 09/17/2013 15:35    EKG: Independently reviewed. Sinus tachycardia  Assessment/Plan Principal Problem:   Sepsis Active Problems:   Cholecystitis   Acute febrile illness   GERD   DIVERTICULOSIS, COLON   Fatigue   CAD (coronary artery disease)   1. Sepsis, present on admission, evidenced by a temperature of 104.4, having a heart rate in the 120s, respiratory rate in the 30s. Lab work showed a white blood cell count of 10.5 with lactate of 1.37. Urinalysis was negative. Chest x-ray did not reveal active disease. It is possible that this may be related to an acute intra-abdominal process such as cholangitis. A CT scan of abdomen and pelvis with contrast has been ordered in the emergency room and is pending at the time of this dictation. Will start empiric IV antibiotic therapy with cefepime and vancomycin. Followup on blood cultures and CT scan. 2. History of cholecystitis. Patient recently admitted to the surgical service and January of 2015 at which time he was diagnosed with cholecystitis. Felt to be a high surgical risk for which she underwent percutaneous cholecystostomy drain placement. General surgery has been consulted by ED physician.  3. Coronary artery disease. Patient undergoing heart cath on 09/11/2013, found to have several lesions involving the  PDA and ostial OM. Patient managed with medical therapy. Will hold Plavix therapy for tonight given the possibility of patient requiring surgical intervention. CT results are pending. I will also hold his  antihypertensive agents as patient having lower blood pressures in the emergency room, with his last blood pressure 106/60.  4. Acute encephalopathy. Wife reports patient lethargic today, becoming too weak to stand up, likely secondary to underlying sepsis. 5. DVT prophylaxis. Lovenox   Code Status: I discussed CODE STATUS with patient and wife present at bedside, patient is a full code Family Communication: Spoke with patient's wife at bedside Disposition Plan: Anticipate patient will require greater than 2 nights hospitalization, admitting to step down unit  Time spent: 70 min  Kelvin Cellar [facility] Pager [phone]

## 2013-09-17 NOTE — Progress Notes (Signed)
ANTIBIOTIC CONSULT NOTE - INITIAL  Pharmacy Consult for vancomycin and Zosyn Indication: sepsis  Allergies  Allergen Reactions  . Hydrochlorothiazide Other (See Comments)    REACTION: joint pain  . Benazepril Hcl Other (See Comments)    unknown    Patient Measurements: Height: 5' 10.08" (178 cm) Weight: 199 lb 15.3 oz (90.7 kg) IBW/kg (Calculated) : 73.18  Vital Signs: BP: 155/64 mmHg (03/17 1450) Pulse Rate: 135 (03/17 1450)  Labs: No results found for this basename: WBC, HGB, PLT, LABCREA, CREATININE,  in the last 72 hours Estimated Creatinine Clearance: 81.7 ml/min (by C-G formula based on Cr of 0.9). No results found for this basename: VANCOTROUGH, VANCOPEAK, VANCORANDOM, GENTTROUGH, GENTPEAK, GENTRANDOM, TOBRATROUGH, TOBRAPEAK, TOBRARND, AMIKACINPEAK, AMIKACINTROU, AMIKACIN,  in the last 72 hours   Microbiology: No results found for this or any previous visit (from the past 720 hour(s)).  Medical History: Past Medical History  Diagnosis Date  . Hyperlipidemia   . Hypertension   . OAD (obstructive airway disease)   . GERD (gastroesophageal reflux disease)   . Diverticulosis   . ED (erectile dysfunction)     no relief with cialis, etc  . Back pain     per [facility]  . Thyroid nodule   . Anemia   . Irregular heartbeat   . Peripheral vascular disease   . Bronchitis   . AAA (abdominal aortic aneurysm)   . Emphysema     "has a little bit" (07/16/2013)  . Pneumonia     "twice since 1959; once when he was a child" (07/16/2013)  . Exertional shortness of breath   . DDD (degenerative disc disease)     cervical, followed by [facility] 2012    Medications:  Scheduled:  . acetaminophen  650 mg Oral Once   Infusions:  . sodium chloride     Followed by  . sodium chloride     Followed by  . sodium chloride    . piperacillin-tazobactam    . sodium chloride    . vancomycin     Assessment: 73 yo M presents to ED with complaints of generalized weakness that began in AM and  emesis prior to EMS arrival.  He is currently lethargic and non-responsive to commands.  Initial labs reveal WBC wnl and SCr 1.0 with estimated CrCl ~74.  Patient is febrile at 104.4.    Goal of Therapy:  Vancomycin trough level 15-20 mcg/ml Resolution of infection  Plan:  - give vancomycin IV 2gm loading dose in ED, followed by 1gm q12h  - plan to draw VT at [facility] - continue Zosyn IV 3.375gm q8h (4h extended infusion) - monitor kidney function, WBC, temperature curve, any cultures and clinical progression  Ovid Curd E. Jacqlyn Larsen, PharmD Clinical Pharmacist - Resident Pager: [phone] Pharmacy: [phone] 09/17/2013 3:15 PM

## 2013-09-17 NOTE — ED Notes (Signed)
Report called to [place] to [place] RN

## 2013-09-17 NOTE — ED Provider Notes (Signed)
CSN: [id number]     Arrival date & time 09/17/13  1433 History   First MD Initiated Contact with Patient 09/17/13 1458     Chief Complaint  Patient presents with  . Weakness  . Code Sepsis     (Consider location/radiation/quality/duration/timing/severity/associated sxs/prior Treatment) HPI Comments: Patient presents with decreased mental status, nausea, fever since this morning.  Wife states patient had nausea this morning after eating breakfast. His mental status is decreased throughout the day. He is be febrile and tachycardic and a decreased level of consciousness. He has a gallbladder drain in place for the past 2 months. A cardiac catheterization last week which showed several blockages which are going to be medically managed. He denies any fevers at home. No cough or urinary symptoms. He said intermittent abdominal pain with nausea.  The history is provided by the patient and the EMS personnel. The history is limited by the condition of the patient.    Past Medical History  Diagnosis Date  . Hyperlipidemia   . Hypertension   . OAD (obstructive airway disease)   . GERD (gastroesophageal reflux disease)   . Diverticulosis   . ED (erectile dysfunction)     no relief with cialis, etc  . Back pain     per [facility]  . Thyroid nodule   . Anemia   . Irregular heartbeat   . Peripheral vascular disease   . Bronchitis   . AAA (abdominal aortic aneurysm)   . Emphysema     "has a little bit" (07/16/2013)  . Pneumonia     "twice since 1959; once when he was a child" (07/16/2013)  . Exertional shortness of breath   . DDD (degenerative disc disease)     cervical, followed by [facility] 2012   Past Surgical History  Procedure Laterality Date  . Small intestine surgery      "blockage" (07/16/2013)  . Skin graft Left     "hand"  . Tonsillectomy    . Colonoscopy w/ polypectomy    . Aortogram  Aug. 1, 2013  . Colectomy  1961  . Iliac artery stent Right 02/2012    "Dr. Bridgett Larsson"   Family History   Problem Relation Age of Onset  . Heart attack Mother   . Heart disease Mother   . Hyperlipidemia Mother   . Stroke Father   . Heart attack Father   . Heart disease Father   . Hyperlipidemia Father   . Hypertension Father   . Other Father     varicose veins  . Heart attack Brother   . Prostate cancer Brother   . Cancer Brother     lung  . Other Brother     varicose veins  . Heart attack Brother   . Heart disease Brother   . Hyperlipidemia Brother   . Other Brother     varicose veins  . Breast cancer Sister   . Diabetes Sister   . Cancer Sister     stomach  . Heart disease Brother   . Hyperlipidemia Brother   . Hypertension Brother   . Heart attack Brother    History  Substance Use Topics  . Smoking status: Former Smoker -- 3.00 packs/day for 30 years    Types: Cigarettes    Quit date: 02/28/1982  . Smokeless tobacco: Never Used  . Alcohol Use: 0.0 oz/week     Comment: 07/16/2013 "used to drink a couple beers/night; stopped  02/2012"    Review of Systems  Unable to  perform ROS: Acuity of condition  Neurological: Positive for weakness.      Allergies  Hydrochlorothiazide and Benazepril hcl  Home Medications   No current outpatient prescriptions on file. BP 115/56  Pulse 95  Temp(Src) 97.8 F (36.6 C) (Oral)  Resp 25  Ht 5\' 10"  (1.778 m)  Wt 188 lb 4.4 oz (85.4 kg)  BMI 27.01 kg/m2  SpO2 97% Physical Exam  Constitutional: He appears distressed.  Patient is only speaking a few words. He is not following commands. He denies any pain. He is tachypneic  HENT:  Head: Normocephalic and atraumatic.  Mouth/Throat: Oropharynx is clear and moist. No oropharyngeal exudate.  Eyes: Conjunctivae and EOM are normal. Pupils are equal, round, and reactive to light. Right eye exhibits no discharge. Left eye exhibits no discharge.  Neck: Normal range of motion. Neck supple.  Cardiovascular: Normal rate, normal heart sounds and intact distal pulses.   tachycardic   Pulmonary/Chest: Breath sounds normal. He is in respiratory distress.  Abdominal: Soft. There is tenderness. There is no rebound and no guarding.  Abdomen is soft. There is right upper quadrant drain in place draining bile.   Genitourinary:  Incontinence of urine  Musculoskeletal: Normal range of motion. He exhibits no edema and no tenderness.  Neurological: He is alert. No cranial nerve deficit.  Patient responds to pain, protects airway, does not follow commands. He moves all his extremities intermittently  Skin: Skin is warm. He is diaphoretic.    ED Course  Procedures (including critical care time) Labs Review Labs Reviewed  CBC WITH DIFFERENTIAL - Abnormal; Notable for the following:    Platelets 113 (*)    Neutrophils Relative % 97 (*)    Neutro Abs 10.1 (*)    Lymphocytes Relative 2 (*)    Lymphs Abs 0.2 (*)    Monocytes Relative 2 (*)    All other components within normal limits  COMPREHENSIVE METABOLIC PANEL - Abnormal; Notable for the following:    Glucose, Bld 126 (*)    AST 253 (*)    ALT 253 (*)    Alkaline Phosphatase 236 (*)    Total Bilirubin 6.0 (*)    GFR calc non Af Amer 85 (*)    All other components within normal limits  URINALYSIS, ROUTINE W REFLEX MICROSCOPIC - Abnormal; Notable for the following:    Color, Urine AMBER (*)    Bilirubin Urine MODERATE (*)    Urobilinogen, UA 2.0 (*)    All other components within normal limits  PRO B NATRIURETIC PEPTIDE - Abnormal; Notable for the following:    Pro B Natriuretic peptide (BNP) 756.4 (*)    All other components within normal limits  CBG MONITORING, ED - Abnormal; Notable for the following:    Glucose-Capillary 110 (*)    All other components within normal limits  I-STAT ARTERIAL BLOOD GAS, ED - Abnormal; Notable for the following:    pO2, Arterial 76.0 (*)    Bicarbonate 24.3 (*)    All other components within normal limits  I-STAT CHEM 8, ED - Abnormal; Notable for the following:    Glucose, Bld  128 (*)    Calcium, Ion 1.10 (*)    All other components within normal limits  MRSA PCR SCREENING  CULTURE, BLOOD (ROUTINE X 2)  CULTURE, BLOOD (ROUTINE X 2)  URINE CULTURE  LIPASE, BLOOD  TROPONIN I  PROTIME-INR  LACTIC ACID, PLASMA  BASIC METABOLIC PANEL  CBC  HEPATIC FUNCTION PANEL  I-STAT CG4 LACTIC  ACID, ED  I-STAT TROPOININ, ED  I-STAT CG4 LACTIC ACID, ED  I-STAT TROPOININ, ED  I-STAT CG4 LACTIC ACID, ED   Imaging Review Ct Abdomen Pelvis Wo Contrast  09/17/2013   CLINICAL DATA:  Patient is lethargic.  Generalized weakness.  EXAM: CT ABDOMEN AND PELVIS WITHOUT CONTRAST  TECHNIQUE: Multidetector CT imaging of the abdomen and pelvis was performed following the standard protocol without intravenous contrast.  COMPARISON:  IR PERC CHOLECYSTOSTOMY dated 07/17/2013; CT ABD/PELV WO CM dated 02/05/2012; US ABDOMEN LIMITED dated 07/15/2013  FINDINGS: The lung bases are clear.  No renal, ureteral, or bladder calculi. No obstructive uropathy. No perinephric stranding is seen. The kidneys are symmetric in size without evidence for exophytic mass. There is a Foley catheter present within the bladder. The prostate gland is mildly enlarged measuring 6.4 cm x 4.5 cm in greatest transverse dimension.  The gallbladder wall is thickened. There is a percutaneous cholecystostomy tube coiled within the gallbladder which is decompressed.  The liver demonstrates no focal abnormality. The spleen demonstrates no focal abnormality. The adrenal glands and pancreas are normal.  The stomach, duodenum, small intestine and large intestine are unremarkable. There is no bowel dilatation to suggest obstruction. There is no pneumoperitoneum, pneumatosis, or portal venous gas. There is no abdominal or pelvic free fluid. There is no lymphadenopathy.  The abdominal aorta is normal in caliber with atherosclerosis.  There is severe degenerative disc disease at L5-S1 with disc height loss. There is mild degenerative disc disease at  L4-5. There is bilateral foraminal stenosis at L5-S1.  IMPRESSION:  1. Gallbladder wall thickening with a percutaneous cholecystostomy tube within the gallbladder in satisfactory position. 2. Foley catheter within the bladder in satisfactory position. 3. No bowel obstruction.   Electronically Signed   By: Kathreen Devoid   On: 09/17/2013 18:32   Ct Head Wo Contrast  09/17/2013   CLINICAL DATA:  Generalized weakness  EXAM: CT HEAD WITHOUT CONTRAST  TECHNIQUE: Contiguous axial images were obtained from the base of the skull through the vertex without intravenous contrast.  COMPARISON:  None.  FINDINGS: There is no evidence of mass effect, midline shift or extra-axial fluid collections. There is no evidence of a space-occupying lesion or intracranial hemorrhage. There is no evidence of a cortical-based area of acute infarction. Mild periventricular white matter low attenuation likely secondary to microangiopathy.  The ventricles and sulci are appropriate for the patient's age. The basal cisterns are patent.  Visualized portions of the orbits are unremarkable. The visualized portions of the paranasal sinuses and mastoid air cells are unremarkable.  The osseous structures are unremarkable.  IMPRESSION: No acute intracranial pathology.   Electronically Signed   By: Kathreen Devoid   On: 09/17/2013 18:25   Dg Chest Port 1 View  09/17/2013   CLINICAL DATA:  Sepsis, weakness  EXAM: PORTABLE CHEST - 1 VIEW  COMPARISON:  DG CHEST 1 VIEW dated 07/17/2013; DG CHEST 2 VIEW dated 07/15/2013; DG CHEST 2 VIEW dated 06/21/2012  FINDINGS: The heart size and mediastinal contours are within normal limits. Both lungs are clear. The visualized skeletal structures are unremarkable.  IMPRESSION: No active disease.   Electronically Signed   By: Skipper Cliche M.D.   On: 09/17/2013 15:35     EKG Interpretation   Date/Time:  Tuesday September 17 2013 14:45:34 EDT Ventricular Rate:  135 PR Interval:  151 QRS Duration: 84 QT Interval:   285 QTC Calculation: 427 R Axis:   27 Text Interpretation:  Sinus tachycardia RSR'  in V1 or V2, probably normal  variant Rate faster Confirmed by Wyvonnia Dusky  MD, Charissa Knowles [phone]) on 09/17/2013  3:14:05 PM      MDM   Final diagnoses:  Sepsis  Cholangitis   Patient appears ill. He is febrile to 104, tachycardic and tachypnea. Follows some commands intermittently and protect his airway. Code sepsis called on arrival.  Broad spectrum antibiotics started, IVF, cultures. Vancomycin and zosyn started.   Mental status has improved in the ED with IV fluids. Patient remains tachypneic and tachycardic. Blood pressure stable. Urinalysis negative. Chest x-ray negative. No obvious source of infection. Patient has elevated LFTs and bilirubin of 6. No abdominal pain. Biliary drain appears to be clear. Concern for cholangitis given LFT elevation and fever.    General surgery has seen patient and recommends medical admission. There is concern for cholangitis with elevated LFTs and fever. We'll obtain CT scan of the abdomen. Case was discussed with Dr. Elsworth Soho critical care who feels patient can be admitted by triad to step down. D/w Dr. Coralyn Pear who will admit patient to step down.   HR 120s, BP [id number] systolic on admission.  CRITICAL CARE Performed by: Ezequiel Essex Total critical care time: 45 Critical care time was exclusive of separately billable procedures and treating other patients. Critical care was necessary to treat or prevent imminent or life-threatening deterioration. Critical care was time spent personally by me on the following activities: development of treatment plan with patient and/or surrogate as well as nursing, discussions with consultants, evaluation of patient's response to treatment, examination of patient, obtaining history from patient or surrogate, ordering and performing treatments and interventions, ordering and review of laboratory studies, ordering and review of radiographic  studies, pulse oximetry and re-evaluation of patient's condition.   Ezequiel Essex, MD 09/18/13 0157

## 2013-09-17 NOTE — Telephone Encounter (Signed)
Received call from Butch Penny at [facility]. She states pt wife called their office. She stated pt ate 1/3 sandwich this morning and now having upper abd pain,colds sweats,nausea and vomiting. Pt had cardiac cath on 09-11-13. I advised Butch Penny pt should go to [facility] Er for eval to determine if these symptoms are related to gallbladder or cardiac. She states she will let the pts wife know this. Per Dr Irven Baltimore request I have paged Jinny Blossom our PA and advised her of pt coming to ER.

## 2013-09-17 NOTE — Telephone Encounter (Signed)
Per chart, TO ED per Dr. Josetta Huddle office instructions .

## 2013-09-18 ENCOUNTER — Inpatient Hospital Stay (HOSPITAL_COMMUNITY): Payer: Medicare Other

## 2013-09-18 DIAGNOSIS — K8309 Other cholangitis: Secondary | ICD-10-CM

## 2013-09-18 DIAGNOSIS — R7881 Bacteremia: Secondary | ICD-10-CM

## 2013-09-18 DIAGNOSIS — K819 Cholecystitis, unspecified: Secondary | ICD-10-CM

## 2013-09-18 LAB — CBC
HCT: 34.8 % — ABNORMAL LOW (ref 39.0–52.0)
Hemoglobin: 11.8 g/dL — ABNORMAL LOW (ref 13.0–17.0)
MCH: 29.6 pg (ref 26.0–34.0)
MCHC: 33.9 g/dL (ref 30.0–36.0)
MCV: 87.4 fL (ref 78.0–100.0)
PLATELETS: 110 10*3/uL — AB (ref 150–400)
RBC: 3.98 MIL/uL — AB (ref 4.22–5.81)
RDW: 14.1 % (ref 11.5–15.5)
WBC: 11.7 10*3/uL — ABNORMAL HIGH (ref 4.0–10.5)

## 2013-09-18 LAB — HEPATIC FUNCTION PANEL
ALT: 166 U/L — ABNORMAL HIGH (ref 0–53)
AST: 112 U/L — AB (ref 0–37)
Albumin: 2.8 g/dL — ABNORMAL LOW (ref 3.5–5.2)
Alkaline Phosphatase: 169 U/L — ABNORMAL HIGH (ref 39–117)
BILIRUBIN DIRECT: 4.8 mg/dL — AB (ref 0.0–0.3)
BILIRUBIN INDIRECT: 1.1 mg/dL — AB (ref 0.3–0.9)
Total Bilirubin: 5.9 mg/dL — ABNORMAL HIGH (ref 0.3–1.2)
Total Protein: 5.7 g/dL — ABNORMAL LOW (ref 6.0–8.3)

## 2013-09-18 LAB — URINE CULTURE
COLONY COUNT: NO GROWTH
CULTURE: NO GROWTH

## 2013-09-18 LAB — BASIC METABOLIC PANEL
BUN: 17 mg/dL (ref 6–23)
CALCIUM: 8.3 mg/dL — AB (ref 8.4–10.5)
CO2: 22 mEq/L (ref 19–32)
Chloride: 104 mEq/L (ref 96–112)
Creatinine, Ser: 1.2 mg/dL (ref 0.50–1.35)
GFR, EST AFRICAN AMERICAN: 67 mL/min — AB (ref >90)
GFR, EST NON AFRICAN AMERICAN: 58 mL/min — AB (ref >90)
GLUCOSE: 128 mg/dL — AB (ref 70–99)
Potassium: 4.8 mEq/L (ref 3.7–5.3)
Sodium: 141 mEq/L (ref 137–147)

## 2013-09-18 LAB — MRSA PCR SCREENING: MRSA by PCR: NEGATIVE

## 2013-09-18 MED ORDER — IOHEXOL 300 MG/ML  SOLN
50.0000 mL | Freq: Once | INTRAMUSCULAR | Status: AC | PRN
  Administered 2013-09-18: 10 mL via INTRAVENOUS

## 2013-09-18 NOTE — Progress Notes (Signed)
Utilization review completed.  

## 2013-09-18 NOTE — Progress Notes (Signed)
NP, Baltazar Najjar notified concerning patient's b/p. It was elevated this evening but went down a bit after rechecking it. Awaiting any further orders.

## 2013-09-18 NOTE — Progress Notes (Signed)
NURSING PROGRESS NOTE  [name] [id number] Transfer Data: 09/18/2013 7:01 PM Attending Provider: Annita Brod, MD OYD:XAJOIN Chad Dunnings, MD Code Status: Full [name] is a 73 y.o. male patient transferred from 63 -No acute distress noted.  -No complaints of shortness of breath.  -No complaints of chest pain.   Cardiac Monitoring: Box # 14.   Blood pressure 168/81, pulse 96, temperature 97.9 F (36.6 C), temperature source Oral, resp. rate 18, height 5\' 10"  (1.778 m), weight 85.4 kg (188 lb 4.4 oz), SpO2 93.00%.   IV Fluids:  IV in place, occlusive dsg intact without redness, IV cath L ac and R forearm, L arm running NS at 31ml/hour.  Allergies:  Hydrochlorothiazide and Benazepril hcl  Past Medical History:   has a past medical history of Hyperlipidemia; Hypertension; OAD (obstructive airway disease); GERD (gastroesophageal reflux disease); Diverticulosis; ED (erectile dysfunction); Back pain; Thyroid nodule; Anemia; Irregular heartbeat; Peripheral vascular disease; Bronchitis; AAA (abdominal aortic aneurysm); Emphysema; Pneumonia; Exertional shortness of breath; and DDD (degenerative disc disease).  Past Surgical History:   has past surgical history that includes Small intestine surgery; Skin graft (Left); Tonsillectomy; Colonoscopy w/ polypectomy; Aortogram (Aug. 1, 2013); Colectomy (1961); and Iliac artery stent (Right, 02/2012).  Social History:   reports that he quit smoking about 31 years ago. His smoking use included Cigarettes. He has a 90 pack-year smoking history. He has never used smokeless tobacco. He reports that he drinks alcohol. He reports that he does not use illicit drugs.  Skin: Intact  Patient/Family orientated to room. Information packet given to patient/family. Admission inpatient armband information verified with patient/family to include name and date of birth and placed on patient arm. Side rails up x 2, fall assessment and education completed  with patient/family. Patient/family able to verbalize understanding of risk associated with falls and verbalized understanding to call for assistance before getting out of bed. Call light within reach. Patient/family able to voice and demonstrate understanding of unit orientation instructions.    Will continue to evaluate and treat per MD orders.

## 2013-09-18 NOTE — Progress Notes (Signed)
Patient ID: [name], male   DOB: 1941-06-16, 73 y.o.   MRN: [id number]   Subjective: Denies abdominal pain, n/v.  Denies cp or sob.  Fevers down, heart rate down.  BC +GNR  Objective:  Vital signs:  Filed Vitals:   09/17/13 2320 09/18/13 0000 09/18/13 0300 09/18/13 0329  BP: 121/59   112/58  Pulse: 87  64 67  Temp:  97.8 F (36.6 C)  97.9 F (36.6 C)  TempSrc:  Oral  Oral  Resp: 21  14 17   Height:      Weight:      SpO2: 97%  100% 100%    Last BM Date: 09/16/13 (per patient)  Intake/Output   Yesterday:  03/17 0701 - 03/18 0700 In: 900 [I.V.:900] Out: 650 [Urine:600; Drains:50] This shift:    I/O last 3 completed shifts: In: 900 [I.V.:900] Out: 650 [Urine:600; Drains:50]    Physical Exam: General: Pt awake/alert/oriented x3 in no acute distress Chest: cta No chest wall pain w good excursion CV:  Pulses intact.  Regular rhythm MS: Normal AROM mjr joints.  No obvious deformity Abdomen: Soft.  Nondistended. Nontender.  RUQ perc chole drain with bilious output.  No evidence of peritonitis.  No incarcerated hernias. Ext:  SCDs BLE.  No mjr edema.  No cyanosis Skin: No petechiae / purpura   Problem List:   Principal Problem:   Sepsis Active Problems:   GERD   DIVERTICULOSIS, COLON   Fatigue   Cholecystitis   Acute febrile illness   CAD (coronary artery disease)    Results:   Labs: Results for orders placed during the hospital encounter of 09/17/13 (from the past 48 hour(s))  CBC WITH DIFFERENTIAL     Status: Abnormal   Collection Time    09/17/13  2:59 PM      Result Value Ref Range   WBC 10.5  4.0 - 10.5 K/uL   RBC 4.80  4.22 - 5.81 MIL/uL   Hemoglobin 14.2  13.0 - 17.0 g/dL   HCT 41.3  39.0 - 52.0 %   MCV 86.0  78.0 - 100.0 fL   MCH 29.6  26.0 - 34.0 pg   MCHC 34.4  30.0 - 36.0 g/dL   RDW 13.8  11.5 - 15.5 %   Platelets 113 (*) 150 - 400 K/uL   Neutrophils Relative % 97 (*) 43 - 77 %   Neutro Abs 10.1 (*) 1.7 - 7.7 K/uL   Lymphocytes  Relative 2 (*) 12 - 46 %   Lymphs Abs 0.2 (*) 0.7 - 4.0 K/uL   Monocytes Relative 2 (*) 3 - 12 %   Monocytes Absolute 0.2  0.1 - 1.0 K/uL   Eosinophils Relative 0  0 - 5 %   Eosinophils Absolute 0.0  0.0 - 0.7 K/uL   Basophils Relative 0  0 - 1 %   Basophils Absolute 0.0  0.0 - 0.1 K/uL  COMPREHENSIVE METABOLIC PANEL     Status: Abnormal   Collection Time    09/17/13  2:59 PM      Result Value Ref Range   Sodium 137  137 - 147 mEq/L   Potassium 3.9  3.7 - 5.3 mEq/L   Chloride 96  96 - 112 mEq/L   CO2 24  19 - 32 mEq/L   Glucose, Bld 126 (*) 70 - 99 mg/dL   BUN 16  6 - 23 mg/dL   Creatinine, Ser 0.83  0.50 - 1.35 mg/dL   Calcium 9.5  8.4 - 10.5 mg/dL   Total Protein 7.1  6.0 - 8.3 g/dL   Albumin 3.5  3.5 - 5.2 g/dL   AST 253 (*) 0 - 37 U/L   ALT 253 (*) 0 - 53 U/L   Alkaline Phosphatase 236 (*) 39 - 117 U/L   Total Bilirubin 6.0 (*) 0.3 - 1.2 mg/dL   GFR calc non Af Amer 85 (*) >90 mL/min   GFR calc Af Amer >90  >90 mL/min   Comment: (NOTE)     The eGFR has been calculated using the CKD EPI equation.     This calculation has not been validated in all clinical situations.     eGFR's persistently <90 mL/min signify possible Chronic Kidney     Disease.  LIPASE, BLOOD     Status: None   Collection Time    09/17/13  2:59 PM      Result Value Ref Range   Lipase 19  11 - 59 U/L  TROPONIN I     Status: None   Collection Time    09/17/13  2:59 PM      Result Value Ref Range   Troponin I <0.30  <0.30 ng/mL   Comment:            Due to the release kinetics of cTnI,     a negative result within the first hours     of the onset of symptoms does not rule out     myocardial infarction with certainty.     If myocardial infarction is still suspected,     repeat the test at appropriate intervals.  PRO B NATRIURETIC PEPTIDE     Status: Abnormal   Collection Time    09/17/13  2:59 PM      Result Value Ref Range   Pro B Natriuretic peptide (BNP) 756.4 (*) 0 - 125 pg/mL  PROTIME-INR      Status: None   Collection Time    09/17/13  2:59 PM      Result Value Ref Range   Prothrombin Time 14.8  11.6 - 15.2 seconds   INR 1.19  0.00 - 1.49  CULTURE, BLOOD (ROUTINE X 2)     Status: None   Collection Time    09/17/13  3:10 PM      Result Value Ref Range   Specimen Description BLOOD ARM RIGHT     Special Requests BOTTLES DRAWN AEROBIC AND ANAEROBIC 5CC     Culture  Setup Time       Value: 09/17/2013 18:52     Performed at [facility]   Culture       Value: GRAM NEGATIVE RODS     Note: Gram Stain Report Called to,Read Back By and Verified With: [place] USSERY AT 4:41 A.M. ON 09/18/13 WARRB     Performed at [facility]   Report Status PENDING    CULTURE, BLOOD (ROUTINE X 2)     Status: None   Collection Time    09/17/13  3:16 PM      Result Value Ref Range   Specimen Description BLOOD RIGHT FOREARM     Special Requests BOTTLES DRAWN AEROBIC AND ANAEROBIC 5CC     Culture  Setup Time       Value: 09/17/2013 18:53     Performed at [facility]   Culture       Value: GRAM NEGATIVE RODS     Note: Gram Stain Report Called to,Read Back  By and Verified With: [place] USSERY 09/18/13 0624A [place]     Performed at [place], ED     Status: None   Collection Time    09/17/13  3:25 PM      Result Value Ref Range   Troponin i, poc 0.02  0.00 - 0.08 ng/mL   Comment 3            Comment: Due to the release kinetics of cTnI,     a negative result within the first hours     of the onset of symptoms does not rule out     myocardial infarction with certainty.     If myocardial infarction is still suspected,     repeat the test at appropriate intervals.  I-STAT CG4 LACTIC ACID, ED     Status: None   Collection Time    09/17/13  3:27 PM      Result Value Ref Range   Lactic Acid, Venous 1.37  0.5 - 2.2 mmol/L  I-STAT CHEM 8, ED     Status: Abnormal   Collection Time    09/17/13  3:37 PM      Result Value  Ref Range   Sodium 138  137 - 147 mEq/L   Potassium 3.7  3.7 - 5.3 mEq/L   Chloride 98  96 - 112 mEq/L   BUN 15  6 - 23 mg/dL   Creatinine, Ser 1.00  0.50 - 1.35 mg/dL   Glucose, Bld 128 (*) 70 - 99 mg/dL   Calcium, Ion 1.10 (*) 1.13 - 1.30 mmol/L   TCO2 25  0 - 100 mmol/L   Hemoglobin 15.0  13.0 - 17.0 g/dL   HCT 44.0  39.0 - 52.0 %  URINALYSIS, ROUTINE W REFLEX MICROSCOPIC     Status: Abnormal   Collection Time    09/17/13  4:10 PM      Result Value Ref Range   Color, Urine AMBER (*) YELLOW   Comment: BIOCHEMICALS MAY BE AFFECTED BY COLOR   APPearance CLEAR  CLEAR   Specific Gravity, Urine 1.014  1.005 - 1.030   pH 6.5  5.0 - 8.0   Glucose, UA NEGATIVE  NEGATIVE mg/dL   Hgb urine dipstick NEGATIVE  NEGATIVE   Bilirubin Urine MODERATE (*) NEGATIVE   Ketones, ur NEGATIVE  NEGATIVE mg/dL   Protein, ur NEGATIVE  NEGATIVE mg/dL   Urobilinogen, UA 2.0 (*) 0.0 - 1.0 mg/dL   Nitrite NEGATIVE  NEGATIVE   Leukocytes, UA NEGATIVE  NEGATIVE   Comment: MICROSCOPIC NOT DONE ON URINES WITH NEGATIVE PROTEIN, BLOOD, LEUKOCYTES, NITRITE, OR GLUCOSE <1000 mg/dL.  CBG MONITORING, ED     Status: Abnormal   Collection Time    09/17/13  4:14 PM      Result Value Ref Range   Glucose-Capillary 110 (*) 70 - 99 mg/dL  I-STAT ARTERIAL BLOOD GAS, ED     Status: Abnormal   Collection Time    09/17/13  4:19 PM      Result Value Ref Range   pH, Arterial 7.402  7.350 - 7.450   pCO2 arterial 39.1  35.0 - 45.0 mmHg   pO2, Arterial 76.0 (*) 80.0 - 100.0 mmHg   Bicarbonate 24.3 (*) 20.0 - 24.0 mEq/L   TCO2 26  0 - 100 mmol/L   O2 Saturation 95.0     Collection site BRACHIAL ARTERY     Drawn by Mining engineer  Sample type ARTERIAL    I-STAT CG4 LACTIC ACID, ED     Status: None   Collection Time    09/17/13  6:42 PM      Result Value Ref Range   Lactic Acid, Venous 0.97  0.5 - 2.2 mmol/L  LACTIC ACID, PLASMA     Status: None   Collection Time    09/17/13  9:30 PM      Result Value Ref Range   Lactic  Acid, Venous 1.0  0.5 - 2.2 mmol/L  MRSA PCR SCREENING     Status: None   Collection Time    09/17/13 11:10 PM      Result Value Ref Range   MRSA by PCR NEGATIVE  NEGATIVE   Comment:            The GeneXpert MRSA Assay (FDA     approved for NASAL specimens     only), is one component of a     comprehensive MRSA colonization     surveillance program. It is not     intended to diagnose MRSA     infection nor to guide or     monitor treatment for     MRSA infections.  BASIC METABOLIC PANEL     Status: Abnormal   Collection Time    09/18/13  2:29 AM      Result Value Ref Range   Sodium 141  137 - 147 mEq/L   Potassium 4.8  3.7 - 5.3 mEq/L   Comment: DELTA CHECK NOTED   Chloride 104  96 - 112 mEq/L   CO2 22  19 - 32 mEq/L   Glucose, Bld 128 (*) 70 - 99 mg/dL   BUN 17  6 - 23 mg/dL   Creatinine, Ser 1.20  0.50 - 1.35 mg/dL   Calcium 8.3 (*) 8.4 - 10.5 mg/dL   GFR calc non Af Amer 58 (*) >90 mL/min   GFR calc Af Amer 67 (*) >90 mL/min   Comment: (NOTE)     The eGFR has been calculated using the CKD EPI equation.     This calculation has not been validated in all clinical situations.     eGFR's persistently <90 mL/min signify possible Chronic Kidney     Disease.  CBC     Status: Abnormal   Collection Time    09/18/13  2:29 AM      Result Value Ref Range   WBC 11.7 (*) 4.0 - 10.5 K/uL   RBC 3.98 (*) 4.22 - 5.81 MIL/uL   Hemoglobin 11.8 (*) 13.0 - 17.0 g/dL   Comment: DELTA CHECK NOTED     REPEATED TO VERIFY   HCT 34.8 (*) 39.0 - 52.0 %   MCV 87.4  78.0 - 100.0 fL   MCH 29.6  26.0 - 34.0 pg   MCHC 33.9  30.0 - 36.0 g/dL   RDW 14.1  11.5 - 15.5 %   Platelets 110 (*) 150 - 400 K/uL   Comment: CONSISTENT WITH PREVIOUS RESULT  HEPATIC FUNCTION PANEL     Status: Abnormal   Collection Time    09/18/13  2:29 AM      Result Value Ref Range   Total Protein 5.7 (*) 6.0 - 8.3 g/dL   Albumin 2.8 (*) 3.5 - 5.2 g/dL   AST 112 (*) 0 - 37 U/L   ALT 166 (*) 0 - 53 U/L   Alkaline  Phosphatase 169 (*) 39 - 117 U/L   Total  Bilirubin 5.9 (*) 0.3 - 1.2 mg/dL   Bilirubin, Direct 4.8 (*) 0.0 - 0.3 mg/dL   Indirect Bilirubin 1.1 (*) 0.3 - 0.9 mg/dL    Imaging / Studies: Ct Abdomen Pelvis Wo Contrast  09/17/2013   CLINICAL DATA:  Patient is lethargic.  Generalized weakness.  EXAM: CT ABDOMEN AND PELVIS WITHOUT CONTRAST  TECHNIQUE: Multidetector CT imaging of the abdomen and pelvis was performed following the standard protocol without intravenous contrast.  COMPARISON:  IR PERC CHOLECYSTOSTOMY dated 07/17/2013; CT ABD/PELV WO CM dated 02/05/2012; US ABDOMEN LIMITED dated 07/15/2013  FINDINGS: The lung bases are clear.  No renal, ureteral, or bladder calculi. No obstructive uropathy. No perinephric stranding is seen. The kidneys are symmetric in size without evidence for exophytic mass. There is a Foley catheter present within the bladder. The prostate gland is mildly enlarged measuring 6.4 cm x 4.5 cm in greatest transverse dimension.  The gallbladder wall is thickened. There is a percutaneous cholecystostomy tube coiled within the gallbladder which is decompressed.  The liver demonstrates no focal abnormality. The spleen demonstrates no focal abnormality. The adrenal glands and pancreas are normal.  The stomach, duodenum, small intestine and large intestine are unremarkable. There is no bowel dilatation to suggest obstruction. There is no pneumoperitoneum, pneumatosis, or portal venous gas. There is no abdominal or pelvic free fluid. There is no lymphadenopathy.  The abdominal aorta is normal in caliber with atherosclerosis.  There is severe degenerative disc disease at L5-S1 with disc height loss. There is mild degenerative disc disease at L4-5. There is bilateral foraminal stenosis at L5-S1.  IMPRESSION:  1. Gallbladder wall thickening with a percutaneous cholecystostomy tube within the gallbladder in satisfactory position. 2. Foley catheter within the bladder in satisfactory position. 3. No  bowel obstruction.   Electronically Signed   By: Kathreen Devoid   On: 09/17/2013 18:32   Ct Head Wo Contrast  09/17/2013   CLINICAL DATA:  Generalized weakness  EXAM: CT HEAD WITHOUT CONTRAST  TECHNIQUE: Contiguous axial images were obtained from the base of the skull through the vertex without intravenous contrast.  COMPARISON:  None.  FINDINGS: There is no evidence of mass effect, midline shift or extra-axial fluid collections. There is no evidence of a space-occupying lesion or intracranial hemorrhage. There is no evidence of a cortical-based area of acute infarction. Mild periventricular white matter low attenuation likely secondary to microangiopathy.  The ventricles and sulci are appropriate for the patient's age. The basal cisterns are patent.  Visualized portions of the orbits are unremarkable. The visualized portions of the paranasal sinuses and mastoid air cells are unremarkable.  The osseous structures are unremarkable.  IMPRESSION: No acute intracranial pathology.   Electronically Signed   By: Kathreen Devoid   On: 09/17/2013 18:25   Dg Chest Port 1 View  09/17/2013   CLINICAL DATA:  Sepsis, weakness  EXAM: PORTABLE CHEST - 1 VIEW  COMPARISON:  DG CHEST 1 VIEW dated 07/17/2013; DG CHEST 2 VIEW dated 07/15/2013; DG CHEST 2 VIEW dated 06/21/2012  FINDINGS: The heart size and mediastinal contours are within normal limits. Both lungs are clear. The visualized skeletal structures are unremarkable.  IMPRESSION: No active disease.   Electronically Signed   By: Skipper Cliche M.D.   On: 09/17/2013 15:35    Medications / Allergies: per chart  Antibiotics: Anti-infectives   Start     Dose/Rate Route Frequency Ordered Stop   09/18/13 0400  vancomycin (VANCOCIN) IVPB 1000 mg/200 mL premix  1,000 mg 200 mL/hr over 60 Minutes Intravenous Every 12 hours 09/17/13 1557     09/17/13 2330  piperacillin-tazobactam (ZOSYN) IVPB 3.375 g  Status:  Discontinued     3.375 g 12.5 mL/hr over 240 Minutes Intravenous  Every 8 hours 09/17/13 1557 09/17/13 2016   09/17/13 2200  ceFEPIme (MAXIPIME) 2 g in dextrose 5 % 50 mL IVPB     2 g 100 mL/hr over 30 Minutes Intravenous Every 12 hours 09/17/13 2016     09/17/13 1515  piperacillin-tazobactam (ZOSYN) IVPB 3.375 g     3.375 g 100 mL/hr over 30 Minutes Intravenous  Once 09/17/13 1505 09/17/13 1625   09/17/13 1515  vancomycin (VANCOCIN) IVPB 1000 mg/200 mL premix  Status:  Discontinued     1,000 mg 200 mL/hr over 60 Minutes Intravenous  Once 09/17/13 1505 09/17/13 1510   09/17/13 1515  vancomycin (VANCOCIN) 2,000 mg in sodium chloride 0.9 % 500 mL IVPB     2,000 mg 250 mL/hr over 120 Minutes Intravenous  Once 09/17/13 1510 09/17/13 1823      Assessment/Plan Cholecystitis s/p perc chole drain Bacteremia--GNR per blood cultures Consider stopping Vanc.  Cefepime probably ok IR for drain injection NPO for now IVF He has been surgically cleared by cardiology.  S/p cardiac cath 3/11 Will discuss with Dr. Brantley Stage timing of laparoscopic cholecystectomy   Erby Pian, [facility] Surgery Pager [phone] Office [phone]  09/18/2013  9:36 AM

## 2013-09-18 NOTE — Consult Note (Signed)
Seen and examined.  Drain working fine.  Will follow

## 2013-09-18 NOTE — Progress Notes (Signed)
Pt transferred with no belongings (wife took them home) to [phone]. VSS. Room air. Report given to Joellen Jersey, Therapist, sports.

## 2013-09-18 NOTE — Progress Notes (Signed)
[facility] PROGRESS NOTE  [name] [id number] DOB: [birth year] DOA: 09/17/2013 PCP: [name], MD  Assessment/Plan: Principal Problem:   Sepsis, Gram negative: Stable. Blood cultures growing out gram-negative organisms. Likely from gallbladder. Continue cefepime and stop vancomycin. Await further cultures and sensitivities Active Problems:   GERD: Stable   Fatigue   Cholecystitis: Appreciate surgery assistance. For drain injection today? For lap cholecystectomy on Friday    Acute febrile illness: Secondary sepsis.    CAD (coronary artery disease): Seen by cardiology. Status post stent plus medically managing. Cleared for surgery.    Ascending cholangitis: Transaminases better. Bilirubin still elevated, although slight improvement. In review of previous labs 2 months ago, bilirubin was normal. Evidence of likely passed stone. Will discuss with gastroenterology.  Code Status: Full code  Family Communication: Plan discussed with multiple family members at the bedside.  Disposition Plan: Stable. Transfer to floor   Consultants:  General surgery  Gastroenterology to be called.  Interventional radiology  Procedures:  IR drain later today?  Cholecystectomy for Friday  Antibiotics:  IV vancomycin 3/17-3/18  IV cefepime 3/17-present  HPI/Subjective: Patient feeling much better. More awake. No pain. Breathing relatively comfortably.  Objective: Filed Vitals:   09/18/13 1240  BP: 149/64  Pulse: 90  Temp:   Resp: 25    Intake/Output Summary (Last 24 hours) at 09/18/13 1322 Last data filed at 09/18/13 1303  Gross per 24 hour  Intake   1600 ml  Output   1200 ml  Net    400 ml   Filed Weights   09/17/13 1450 09/17/13 1500 09/17/13 2012  Weight: 90.719 kg (200 lb) 90.7 kg (199 lb 15.3 oz) 85.4 kg (188 lb 4.4 oz)    Exam:   General: Alert and oriented x3, no acute distress  Cardiovascular: Regular rate and rhythm, S1-S2  Respiratory:  Clear to auscultation bilaterally  Abdomen: Soft, nontender, obese, positive bowel sounds  Musculoskeletal: No clubbing or cyanosis, trace edema   Data Reviewed: Basic Metabolic Panel:  Recent Labs Lab 09/17/13 1459 09/17/13 1537 09/18/13 0229  NA 137 138 141  K 3.9 3.7 4.8  CL 96 98 104  CO2 24  --  22  GLUCOSE 126* 128* 128*  BUN 16 15 17   CREATININE 0.83 1.00 1.20  CALCIUM 9.5  --  8.3*   Liver Function Tests:  Recent Labs Lab 09/17/13 1459 09/18/13 0229  AST 253* 112*  ALT 253* 166*  ALKPHOS 236* 169*  BILITOT 6.0* 5.9*  PROT 7.1 5.7*  ALBUMIN 3.5 2.8*    Recent Labs Lab 09/17/13 1459  LIPASE 19   No results found for this basename: AMMONIA,  in the last 168 hours CBC:  Recent Labs Lab 09/17/13 1459 09/17/13 1537 09/18/13 0229  WBC 10.5  --  11.7*  NEUTROABS 10.1*  --   --   HGB 14.2 15.0 11.8*  HCT 41.3 44.0 34.8*  MCV 86.0  --  87.4  PLT 113*  --  110*   Cardiac Enzymes:  Recent Labs Lab 09/17/13 1459  TROPONINI <0.30   BNP (last 3 results)  Recent Labs  09/17/13 1459  PROBNP 756.4*   CBG:  Recent Labs Lab 09/17/13 1614  GLUCAP 110*    Recent Results (from the past 240 hour(s))  CULTURE, BLOOD (ROUTINE X 2)     Status: None   Collection Time    09/17/13  3:10 PM      Result Value Ref Range Status   Specimen Description BLOOD  ARM RIGHT   Final   Special Requests BOTTLES DRAWN AEROBIC AND ANAEROBIC 5CC   Final   Culture  Setup Time     Final   Value: 09/17/2013 18:52     Performed at [facility]   Culture     Final   Value: GRAM NEGATIVE RODS     Note: Gram Stain Report Called to,Read Back By and Verified With: [place] USSERY AT 4:41 A.M. ON 09/18/13 WARRB     Performed at [facility]   Report Status PENDING   Incomplete  CULTURE, BLOOD (ROUTINE X 2)     Status: None   Collection Time    09/17/13  3:16 PM      Result Value Ref Range Status   Specimen Description BLOOD RIGHT FOREARM   Final   Special  Requests BOTTLES DRAWN AEROBIC AND ANAEROBIC 5CC   Final   Culture  Setup Time     Final   Value: 09/17/2013 18:53     Performed at [facility]   Culture     Final   Value: GRAM NEGATIVE RODS     Note: Gram Stain Report Called to,Read Back By and Verified With: [place] USSERY 09/18/13 0624A [place]     Performed at [facility]   Report Status PENDING   Incomplete  URINE CULTURE     Status: None   Collection Time    09/17/13  4:10 PM      Result Value Ref Range Status   Specimen Description URINE, CATHETERIZED   Final   Special Requests NONE   Final   Culture  Setup Time     Final   Value: 09/17/2013 16:58     Performed at [place]     Final   Value: NO GROWTH     Performed at [facility]   Culture     Final   Value: NO GROWTH     Performed at [facility]   Report Status 09/18/2013 FINAL   Final  MRSA PCR SCREENING     Status: None   Collection Time    09/17/13 11:10 PM      Result Value Ref Range Status   MRSA by PCR NEGATIVE  NEGATIVE Final   Comment:            The GeneXpert MRSA Assay (FDA     approved for NASAL specimens     only), is one component of a     comprehensive MRSA colonization     surveillance program. It is not     intended to diagnose MRSA     infection nor to guide or     monitor treatment for     MRSA infections.     Studies: Ct Abdomen Pelvis Wo Contrast  09/17/2013   CLINICAL DATA:  Patient is lethargic.  Generalized weakness.  EXAM: CT ABDOMEN AND PELVIS WITHOUT CONTRAST  TECHNIQUE: Multidetector CT imaging of the abdomen and pelvis was performed following the standard protocol without intravenous contrast.  COMPARISON:  IR PERC CHOLECYSTOSTOMY dated 07/17/2013; CT ABD/PELV WO CM dated 02/05/2012; US ABDOMEN LIMITED dated 07/15/2013  FINDINGS: The lung bases are clear.  No renal, ureteral, or bladder calculi. No obstructive uropathy. No perinephric stranding is seen. The kidneys are symmetric in  size without evidence for exophytic mass. There is a Foley catheter present within the bladder. The prostate gland is mildly enlarged measuring 6.4 cm x  4.5 cm in greatest transverse dimension.  The gallbladder wall is thickened. There is a percutaneous cholecystostomy tube coiled within the gallbladder which is decompressed.  The liver demonstrates no focal abnormality. The spleen demonstrates no focal abnormality. The adrenal glands and pancreas are normal.  The stomach, duodenum, small intestine and large intestine are unremarkable. There is no bowel dilatation to suggest obstruction. There is no pneumoperitoneum, pneumatosis, or portal venous gas. There is no abdominal or pelvic free fluid. There is no lymphadenopathy.  The abdominal aorta is normal in caliber with atherosclerosis.  There is severe degenerative disc disease at L5-S1 with disc height loss. There is mild degenerative disc disease at L4-5. There is bilateral foraminal stenosis at L5-S1.  IMPRESSION:  1. Gallbladder wall thickening with a percutaneous cholecystostomy tube within the gallbladder in satisfactory position. 2. Foley catheter within the bladder in satisfactory position. 3. No bowel obstruction.   Electronically Signed   By: Kathreen Devoid   On: 09/17/2013 18:32   Ct Head Wo Contrast  09/17/2013   CLINICAL DATA:  Generalized weakness  EXAM: CT HEAD WITHOUT CONTRAST  TECHNIQUE: Contiguous axial images were obtained from the base of the skull through the vertex without intravenous contrast.  COMPARISON:  None.  FINDINGS: There is no evidence of mass effect, midline shift or extra-axial fluid collections. There is no evidence of a space-occupying lesion or intracranial hemorrhage. There is no evidence of a cortical-based area of acute infarction. Mild periventricular white matter low attenuation likely secondary to microangiopathy.  The ventricles and sulci are appropriate for the patient's age. The basal cisterns are patent.  Visualized  portions of the orbits are unremarkable. The visualized portions of the paranasal sinuses and mastoid air cells are unremarkable.  The osseous structures are unremarkable.  IMPRESSION: No acute intracranial pathology.   Electronically Signed   By: Kathreen Devoid   On: 09/17/2013 18:25   Dg Chest Port 1 View  09/17/2013   CLINICAL DATA:  Sepsis, weakness  EXAM: PORTABLE CHEST - 1 VIEW  COMPARISON:  DG CHEST 1 VIEW dated 07/17/2013; DG CHEST 2 VIEW dated 07/15/2013; DG CHEST 2 VIEW dated 06/21/2012  FINDINGS: The heart size and mediastinal contours are within normal limits. Both lungs are clear. The visualized skeletal structures are unremarkable.  IMPRESSION: No active disease.   Electronically Signed   By: Skipper Cliche M.D.   On: 09/17/2013 15:35    Scheduled Meds: . ceFEPime (MAXIPIME) IV  2 g Intravenous Q12H  . enoxaparin (LOVENOX) injection  40 mg Subcutaneous Q24H  . sodium chloride  3 mL Intravenous Q12H  . vancomycin  1,000 mg Intravenous Q12H   Continuous Infusions: . sodium chloride 100 mL/hr at 09/18/13 0500    Principal Problem:   Sepsis, Gram negative Active Problems:   GERD   Fatigue   Cholecystitis   Acute febrile illness   CAD (coronary artery disease)   Ascending cholangitis    Time spent: 25 minutes    [facility] Pager [phone]. If 7PM-7AM, please contact night-coverage at [email or web address], password [facility] 09/18/2013, 1:22 PM  LOS: 1 day

## 2013-09-18 NOTE — Progress Notes (Signed)
Both sets of blood cultures were positive for gram negative rods. NP notified at 0440 for 1st set. Notified about second set at [place].

## 2013-09-18 NOTE — Procedures (Signed)
Chole tube injection - cystic and common bile duct patent

## 2013-09-18 NOTE — Progress Notes (Signed)
Total bilirubin is 5.9.  Needs GI to see. May have passed stone.  Hold plavix for now.  May be able to do lap chole IOC Friday.  Follow LFT level.  May have had element of ascending cholangitis.

## 2013-09-19 ENCOUNTER — Other Ambulatory Visit (HOSPITAL_COMMUNITY): Payer: Medicare Other

## 2013-09-19 ENCOUNTER — Encounter (HOSPITAL_COMMUNITY): Payer: Medicare Other

## 2013-09-19 ENCOUNTER — Ambulatory Visit: Payer: Medicare Other | Admitting: Family

## 2013-09-19 DIAGNOSIS — R5383 Other fatigue: Secondary | ICD-10-CM

## 2013-09-19 DIAGNOSIS — R5381 Other malaise: Secondary | ICD-10-CM

## 2013-09-19 DIAGNOSIS — A4150 Gram-negative sepsis, unspecified: Secondary | ICD-10-CM

## 2013-09-19 LAB — COMPREHENSIVE METABOLIC PANEL
ALBUMIN: 2.9 g/dL — AB (ref 3.5–5.2)
ALK PHOS: 164 U/L — AB (ref 39–117)
ALT: 118 U/L — AB (ref 0–53)
ALT: 105 U/L — AB (ref 0–53)
AST: 50 U/L — AB (ref 0–37)
AST: 53 U/L — ABNORMAL HIGH (ref 0–37)
Albumin: 3 g/dL — ABNORMAL LOW (ref 3.5–5.2)
Alkaline Phosphatase: 162 U/L — ABNORMAL HIGH (ref 39–117)
BUN: 21 mg/dL (ref 6–23)
BUN: 18 mg/dL (ref 6–23)
CHLORIDE: 99 meq/L (ref 96–112)
CO2: 26 mEq/L (ref 19–32)
CO2: 26 mEq/L (ref 19–32)
Calcium: 8.9 mg/dL (ref 8.4–10.5)
Calcium: 8.7 mg/dL (ref 8.4–10.5)
Chloride: 102 mEq/L (ref 96–112)
Creatinine, Ser: 1.29 mg/dL (ref 0.50–1.35)
Creatinine, Ser: 1.1 mg/dL (ref 0.50–1.35)
GFR calc Af Amer: 61 mL/min — ABNORMAL LOW (ref >90)
GFR calc non Af Amer: 53 mL/min — ABNORMAL LOW (ref >90)
GFR calc non Af Amer: 64 mL/min — ABNORMAL LOW (ref >90)
GFR, EST AFRICAN AMERICAN: 74 mL/min — AB (ref >90)
GLUCOSE: 107 mg/dL — AB (ref 70–99)
Glucose, Bld: 98 mg/dL (ref 70–99)
POTASSIUM: 4.4 meq/L (ref 3.7–5.3)
POTASSIUM: 4.2 meq/L (ref 3.7–5.3)
SODIUM: 143 meq/L (ref 137–147)
Sodium: 138 mEq/L (ref 137–147)
TOTAL PROTEIN: 6.6 g/dL (ref 6.0–8.3)
Total Bilirubin: 2.5 mg/dL — ABNORMAL HIGH (ref 0.3–1.2)
Total Bilirubin: 2.2 mg/dL — ABNORMAL HIGH (ref 0.3–1.2)
Total Protein: 6.4 g/dL (ref 6.0–8.3)

## 2013-09-19 LAB — CBC WITH DIFFERENTIAL/PLATELET
Basophils Absolute: 0 10*3/uL (ref 0.0–0.1)
Basophils Relative: 0 % (ref 0–1)
Eosinophils Absolute: 0 10*3/uL (ref 0.0–0.7)
Eosinophils Relative: 0 % (ref 0–5)
HCT: 38.2 % — ABNORMAL LOW (ref 39.0–52.0)
Hemoglobin: 12.8 g/dL — ABNORMAL LOW (ref 13.0–17.0)
LYMPHS ABS: 0.4 10*3/uL — AB (ref 0.7–4.0)
Lymphocytes Relative: 6 % — ABNORMAL LOW (ref 12–46)
MCH: 29.3 pg (ref 26.0–34.0)
MCHC: 33.5 g/dL (ref 30.0–36.0)
MCV: 87.4 fL (ref 78.0–100.0)
Monocytes Absolute: 0.8 10*3/uL (ref 0.1–1.0)
Monocytes Relative: 11 % (ref 3–12)
NEUTROS ABS: 5.8 10*3/uL (ref 1.7–7.7)
NEUTROS PCT: 82 % — AB (ref 43–77)
Platelets: 124 10*3/uL — ABNORMAL LOW (ref 150–400)
RBC: 4.37 MIL/uL (ref 4.22–5.81)
RDW: 14.2 % (ref 11.5–15.5)
WBC: 7 10*3/uL (ref 4.0–10.5)

## 2013-09-19 LAB — CBC
HEMATOCRIT: 39.5 % (ref 39.0–52.0)
Hemoglobin: 13.2 g/dL (ref 13.0–17.0)
MCH: 29.5 pg (ref 26.0–34.0)
MCHC: 33.4 g/dL (ref 30.0–36.0)
MCV: 88.2 fL (ref 78.0–100.0)
Platelets: 125 10*3/uL — ABNORMAL LOW (ref 150–400)
RBC: 4.48 MIL/uL (ref 4.22–5.81)
RDW: 14.3 % (ref 11.5–15.5)
WBC: 6.7 10*3/uL (ref 4.0–10.5)

## 2013-09-19 MED ORDER — CARVEDILOL 6.25 MG PO TABS
6.2500 mg | ORAL_TABLET | Freq: Two times a day (BID) | ORAL | Status: DC
  Administered 2013-09-19 – 2013-09-23 (×8): 6.25 mg via ORAL
  Filled 2013-09-19 (×10): qty 1

## 2013-09-19 MED ORDER — HYDRALAZINE HCL 20 MG/ML IJ SOLN
10.0000 mg | Freq: Four times a day (QID) | INTRAMUSCULAR | Status: DC | PRN
  Administered 2013-09-21 – 2013-09-23 (×2): 10 mg via INTRAVENOUS
  Filled 2013-09-19 (×2): qty 1

## 2013-09-19 MED ORDER — PANTOPRAZOLE SODIUM 40 MG PO TBEC
40.0000 mg | DELAYED_RELEASE_TABLET | Freq: Every day | ORAL | Status: DC
  Administered 2013-09-19 – 2013-09-23 (×4): 40 mg via ORAL
  Filled 2013-09-19 (×5): qty 1

## 2013-09-19 MED ORDER — SIMVASTATIN 20 MG PO TABS
20.0000 mg | ORAL_TABLET | Freq: Every day | ORAL | Status: DC
  Administered 2013-09-19 – 2013-09-22 (×3): 20 mg via ORAL
  Filled 2013-09-19 (×5): qty 1

## 2013-09-19 MED ORDER — CEFAZOLIN SODIUM-DEXTROSE 2-3 GM-% IV SOLR
2.0000 g | INTRAVENOUS | Status: AC
  Administered 2013-09-20: 2 g via INTRAVENOUS
  Filled 2013-09-19: qty 50

## 2013-09-19 MED ORDER — CHLORHEXIDINE GLUCONATE 4 % EX LIQD
1.0000 | Freq: Once | CUTANEOUS | Status: AC
Start: 2013-09-19 — End: 2013-09-19
  Administered 2013-09-19: 1 via TOPICAL
  Filled 2013-09-19: qty 15

## 2013-09-19 NOTE — Consult Note (Signed)
Patient seen, examined, and I agree with the above documentation, including the assessment and plan. E coli bacteremia most likely biliary source.  LFTs up in cholestatic pattern and now improving.  Perc drain shows patent CBD.  Likely he has passed a stone. With improving LFTs, agree with lap chole tomorrow per surgery with IOC.  If + IOC would need ERCP Continue IV abx Please call if IOC + or questions

## 2013-09-19 NOTE — Progress Notes (Signed)
Addendum  Patient seen and examined, chart and data base reviewed.  I agree with the above assessment and plan.  For full details please see Mrs. Imogene Burn PA note.  Sepsis, Escherichia coli bacteremia likely secondary to biliary infection.  Percutaneous cholangiography showed gallstones but patent CBD.  GI and general surgery consulted, patient for laparoscopic cholecystectomy in a.m.   Birdie Hopes, MD [facility] Pager: [phone] 09/19/2013, 3:37 PM

## 2013-09-19 NOTE — Consult Note (Signed)
Referring Provider: No ref. provider found Primary Care Physician:  [name], MD Primary Gastroenterologist:  Dr. Sharlett Iles  Reason for Consultation:  Elevated LFT's; ? cholangitis  HPI: [name] is a 73 y.o. male with multiple comorbidities including coronary artery disease, hypertension, dyslipidemia, PVD, and AAA admitted to the surgery service on 07/15/2013 at which time he presented with abdominal pain, diagnosed with acute cholecystitis. Given multiple comorbidities, felt to be a high surgical risk for which he underwent percutaneous cholecystostomy drain placement. He was discharged from the surgical service on 07/19/2013. He followed up with cardiology for preoperative clearance. Stress test on 09/05/2013 revealed reversible ischemia. He was taken to the Cath Lab on 09/11/2013 and cardiology felt that the best option for definitive gallbladder surgery would be medical management, as multiple lesions would not respond well to stenting.  Was going to have eventual cholecystectomy, but on 3/17 he was noted by his wife to appear acutely ill, lethargic, diaphoretic, having fevers and chills with vomiting.  Patient denies having any abdominal pain. As above also reports that he became so weak he was unable to stand on his own.  In ED was placed on Vancomycin and Zosyn.  Two sets of blood cultures showing gram negative rods.  He is now on cefepime.  Still denies any abdominal pain.  No further nausea/vomiting.  Tolerated clear liquid diet today.  Surgery planning lap chole with IOC tomorrow, 3/20.  Patient's LFT's are elevated with total bili of 6.0 on admission.  LFT's have been trending down with total bili 2.5, AST 50, ALT 118, and ALP 162 today.  GI was consulted to consider ERCP for possible choledocholithiasis.     Past Medical History  Diagnosis Date  . Hyperlipidemia   . Hypertension   . OAD (obstructive airway disease)   . GERD (gastroesophageal reflux disease)   .  Diverticulosis   . ED (erectile dysfunction)     no relief with cialis, etc  . Back pain     per [facility]  . Thyroid nodule   . Anemia   . Irregular heartbeat   . Peripheral vascular disease   . Bronchitis   . AAA (abdominal aortic aneurysm)   . Emphysema     "has a little bit" (07/16/2013)  . Pneumonia     "twice since 1959; once when he was a child" (07/16/2013)  . Exertional shortness of breath   . DDD (degenerative disc disease)     cervical, followed by [facility] 2012    Past Surgical History  Procedure Laterality Date  . Small intestine surgery      "blockage" (07/16/2013)  . Skin graft Left     "hand"  . Tonsillectomy    . Colonoscopy w/ polypectomy    . Aortogram  Aug. 1, 2013  . Colectomy  1961  . Iliac artery stent Right 02/2012    "Dr. Bridgett Larsson"    Prior to Admission medications   Medication Sig Start Date End Date Taking? Authorizing Provider  aspirin EC 325 MG EC tablet Take 1 tablet (325 mg total) by mouth daily. 07/19/13  Yes Maryann Mikhail, DO  carvedilol (COREG) 6.25 MG tablet Take 1 tablet (6.25 mg total) by mouth 2 (two) times daily. 09/13/13  Yes Josue Hector, MD  finasteride (PROSCAR) 5 MG tablet Take 5 mg by mouth every evening.  05/21/12  Yes Historical Provider, MD  isosorbide mononitrate (IMDUR) 30 MG 24 hr tablet Take 1 tablet (30 mg total) by mouth daily. 09/13/13  Yes Josue Hector, MD  omeprazole (PRILOSEC) 20 MG capsule Take 20 mg by mouth every morning.   Yes Historical Provider, MD  pravastatin (PRAVACHOL) 40 MG tablet Take 20 mg by mouth every evening. Takes half tab daily   Yes Historical Provider, MD  tamsulosin (FLOMAX) 0.4 MG CAPS capsule Take 0.4 mg by mouth daily after supper.   Yes Historical Provider, MD  clopidogrel (PLAVIX) 75 MG tablet Take 75 mg by mouth every evening.    Historical Provider, MD    Current Facility-Administered Medications  Medication Dose Route Frequency Provider Last Rate Last Dose  . 0.9 %  sodium chloride infusion    Intravenous Continuous Annita Brod, MD 50 mL/hr at 09/18/13 1457    . acetaminophen (TYLENOL) tablet 650 mg  650 mg Oral Q6H PRN Kelvin Cellar, MD   650 mg at 09/18/13 1312   Or  . acetaminophen (TYLENOL) suppository 650 mg  650 mg Rectal Q6H PRN Kelvin Cellar, MD      . alum & mag hydroxide-simeth (MAALOX/MYLANTA) 200-200-20 MG/5ML suspension 30 mL  30 mL Oral Q6H PRN Kelvin Cellar, MD      . ceFEPIme (MAXIPIME) 2 g in dextrose 5 % 50 mL IVPB  2 g Intravenous Q12H Kelvin Cellar, MD   2 g at 09/18/13 2137  . enoxaparin (LOVENOX) injection 40 mg  40 mg Subcutaneous Q24H Kelvin Cellar, MD   40 mg at 09/17/13 2218  . morphine 2 MG/ML injection 2 mg  2 mg Intravenous Q4H PRN Kelvin Cellar, MD      . ondansetron (ZOFRAN) tablet 4 mg  4 mg Oral Q6H PRN Kelvin Cellar, MD       Or  . ondansetron (ZOFRAN) injection 4 mg  4 mg Intravenous Q6H PRN Kelvin Cellar, MD      . oxyCODONE (Oxy IR/ROXICODONE) immediate release tablet 5 mg  5 mg Oral Q4H PRN Kelvin Cellar, MD      . sodium chloride 0.9 % injection 3 mL  3 mL Intravenous Q12H Kelvin Cellar, MD   3 mL at 09/18/13 2137    Allergies as of 09/17/2013 - Review Complete 09/17/2013  Allergen Reaction Noted  . Hydrochlorothiazide Other (See Comments)   . Benazepril hcl Other (See Comments)     Family History  Problem Relation Age of Onset  . Heart attack Mother   . Heart disease Mother   . Hyperlipidemia Mother   . Stroke Father   . Heart attack Father   . Heart disease Father   . Hyperlipidemia Father   . Hypertension Father   . Other Father     varicose veins  . Heart attack Brother   . Prostate cancer Brother   . Cancer Brother     lung  . Other Brother     varicose veins  . Heart attack Brother   . Heart disease Brother   . Hyperlipidemia Brother   . Other Brother     varicose veins  . Breast cancer Sister   . Diabetes Sister   . Cancer Sister     stomach  . Heart disease Brother   . Hyperlipidemia  Brother   . Hypertension Brother   . Heart attack Brother     History   Social History  . Marital Status: Married    Spouse Name: N/A    Number of Children: N/A  . Years of Education: N/A   Occupational History  . Not on file.   Social History  Main Topics  . Smoking status: Former Smoker -- 3.00 packs/day for 30 years    Types: Cigarettes    Quit date: 02/28/1982  . Smokeless tobacco: Never Used  . Alcohol Use: 0.0 oz/week     Comment: 07/16/2013 "used to drink a couple beers/night; stopped  02/2012"  . Drug Use: No  . Sexual Activity: Not Currently   Other Topics Concern  . Not on file   Social History Narrative   Married 1959   Retired from Writer at [employer]   Enjoys fishing   1 daughter and 1 son    Review of Systems: Ten point ROS is O/W negative except as mentioned in HPI.  Physical Exam: Vital signs in last 24 hours: Temp:  [97.8 F (36.6 C)-99.5 F (37.5 C)] 99.5 F (37.5 C) (03/19 0507) Pulse Rate:  [82-104] 98 (03/19 0507) Resp:  [17-25] 18 (03/19 0507) BP: (136-181)/(62-82) 162/71 mmHg (03/19 0507) SpO2:  [92 %-95 %] 95 % (03/19 0507) Weight:  [184 lb 6.4 oz (83.643 kg)] 184 lb 6.4 oz (83.643 kg) (03/19 0500) Last BM Date: 09/16/13 General:  Alert, Well-developed, well-nourished, pleasant and cooperative in NAD Head:  Normocephalic and atraumatic. Eyes:  Sclera clear, no icterus.  Conjunctiva pink. Ears:  Normal auditory acuity. Mouth:  No deformity or lesions.  Lungs:  Clear throughout to auscultation.  No wheezes, crackles, or rhonchi.  Heart:  Regular rate and rhythm. Abdomen:  Soft, non-distended. BS present.  Non-tender.  RUQ perc chole drain with bilious output.   Rectal:  Deferred.  Msk:  Symmetrical without gross deformities. Pulses:  Normal pulses noted. Extremities:  Without clubbing or edema. Neurologic:  Alert and  oriented x4;  grossly normal neurologically. Skin:  Intact without significant lesions or rashes. Psych:   Alert and cooperative. Normal mood and affect.  Intake/Output from previous day: 03/18 0701 - 03/19 0700 In: 1863.3 [P.O.:360; I.V.:1203.3; IV Piggyback:300] Out: 1935 [Urine:1885; Drains:50] Intake/Output this shift: Total I/O In: -  Out: 1100 [Urine:1100]  Lab Results:  Recent Labs  09/17/13 1459 09/17/13 1537 09/18/13 0229 09/19/13 0835  WBC 10.5  --  11.7* 6.7  HGB 14.2 15.0 11.8* 13.2  HCT 41.3 44.0 34.8* 39.5  PLT 113*  --  110* 125*   BMET  Recent Labs  09/17/13 1459 09/17/13 1537 09/18/13 0229 09/19/13 0835  NA 137 138 141 143  K 3.9 3.7 4.8 4.4  CL 96 98 104 102  CO2 24  --  22 26  GLUCOSE 126* 128* 128* 98  BUN 16 15 17 21   CREATININE 0.83 1.00 1.20 1.29  CALCIUM 9.5  --  8.3* 8.9   LFT  Recent Labs  09/18/13 0229 09/19/13 0835  PROT 5.7* 6.6  ALBUMIN 2.8* 3.0*  AST 112* 50*  ALT 166* 118*  ALKPHOS 169* 162*  BILITOT 5.9* 2.5*  BILIDIR 4.8*  --   IBILI 1.1*  --    PT/INR  Recent Labs  09/17/13 1459  LABPROT 14.8  INR 1.19   Studies/Results: Ct Abdomen Pelvis Wo Contrast  09/17/2013   CLINICAL DATA:  Patient is lethargic.  Generalized weakness.  EXAM: CT ABDOMEN AND PELVIS WITHOUT CONTRAST  TECHNIQUE: Multidetector CT imaging of the abdomen and pelvis was performed following the standard protocol without intravenous contrast.  COMPARISON:  IR PERC CHOLECYSTOSTOMY dated 07/17/2013; CT ABD/PELV WO CM dated 02/05/2012; US ABDOMEN LIMITED dated 07/15/2013  FINDINGS: The lung bases are clear.  No renal, ureteral, or bladder calculi. No obstructive  uropathy. No perinephric stranding is seen. The kidneys are symmetric in size without evidence for exophytic mass. There is a Foley catheter present within the bladder. The prostate gland is mildly enlarged measuring 6.4 cm x 4.5 cm in greatest transverse dimension.  The gallbladder wall is thickened. There is a percutaneous cholecystostomy tube coiled within the gallbladder which is decompressed.  The liver  demonstrates no focal abnormality. The spleen demonstrates no focal abnormality. The adrenal glands and pancreas are normal.  The stomach, duodenum, small intestine and large intestine are unremarkable. There is no bowel dilatation to suggest obstruction. There is no pneumoperitoneum, pneumatosis, or portal venous gas. There is no abdominal or pelvic free fluid. There is no lymphadenopathy.  The abdominal aorta is normal in caliber with atherosclerosis.  There is severe degenerative disc disease at L5-S1 with disc height loss. There is mild degenerative disc disease at L4-5. There is bilateral foraminal stenosis at L5-S1.  IMPRESSION:  1. Gallbladder wall thickening with a percutaneous cholecystostomy tube within the gallbladder in satisfactory position. 2. Foley catheter within the bladder in satisfactory position. 3. No bowel obstruction.   Electronically Signed   By: Kathreen Devoid   On: 09/17/2013 18:32   Ct Head Wo Contrast  09/17/2013   CLINICAL DATA:  Generalized weakness  EXAM: CT HEAD WITHOUT CONTRAST  TECHNIQUE: Contiguous axial images were obtained from the base of the skull through the vertex without intravenous contrast.  COMPARISON:  None.  FINDINGS: There is no evidence of mass effect, midline shift or extra-axial fluid collections. There is no evidence of a space-occupying lesion or intracranial hemorrhage. There is no evidence of a cortical-based area of acute infarction. Mild periventricular white matter low attenuation likely secondary to microangiopathy.  The ventricles and sulci are appropriate for the patient's age. The basal cisterns are patent.  Visualized portions of the orbits are unremarkable. The visualized portions of the paranasal sinuses and mastoid air cells are unremarkable.  The osseous structures are unremarkable.  IMPRESSION: No acute intracranial pathology.   Electronically Signed   By: Kathreen Devoid   On: 09/17/2013 18:25   [place] Tube  09/18/2013   CLINICAL DATA:   Cholecystitis.  Cholecystostomy tube in place  EXAM: CHOLANGIOGRAM VIA EXISTING CATHETER  MEDICATIONS: None.  CONTRAST:  49mL OMNIPAQUE IOHEXOL 300 MG/ML  SOLN  FLUOROSCOPY TIME:  24 seconds.  PROCEDURE: Contrast was injected into the cholecystostomy tube and imaging was obtained.  Complications: None.  FINDINGS: Contrast fills the gallbladder, cystic duct, common bile duct, and duodenum compatible with patency. Several gallstones are seen within the lumen of the gallbladder.  IMPRESSION: Cystic and common bile ducts are patent.  Cholelithiasis.   Electronically Signed   By: Maryclare Bean M.D.   On: 09/18/2013 17:10   Dg Chest Port 1 View  09/17/2013   CLINICAL DATA:  Sepsis, weakness  EXAM: PORTABLE CHEST - 1 VIEW  COMPARISON:  DG CHEST 1 VIEW dated 07/17/2013; DG CHEST 2 VIEW dated 07/15/2013; DG CHEST 2 VIEW dated 06/21/2012  FINDINGS: The heart size and mediastinal contours are within normal limits. Both lungs are clear. The visualized skeletal structures are unremarkable.  IMPRESSION: No active disease.   Electronically Signed   By: Skipper Cliche M.D.   On: 09/17/2013 15:35    IMPRESSION:  -Bacteremia with altered mental status:  Blood cultures positive for GNR. -Cholecystitis/cholelithiasis with perc chole drain placed in 07/2013 and recent tube check showed that ducts were patent.  Now on Cefepime. -Elevated  LFT's and total bili:  Trending down.  Possibly passed a stone or related to cholecystitis.    PLAN: -Agree with antibiotics. -Will likely wait for cholecystectomy with IOC vs possible MRCP before proceeding with an ERCP since LFT's and total bili are trending down.  Will discuss with Dr. Hilarie Fredrickson.   Toluwani Ruder D.  09/19/2013, 10:22 AM  Pager number [phone]

## 2013-09-19 NOTE — Progress Notes (Signed)
Patient ID: [name], male   DOB: 04/10/1941, 73 y.o.   MRN: [id number]   Subjective: Denies pain.  Afebrile.  No n/v.  Had a BM yesterday.  Adequate UOP.   Objective:  Vital signs:  Filed Vitals:   09/18/13 2051 09/18/13 2204 09/19/13 0500 09/19/13 0507  BP: 181/82 163/69  162/71  Pulse: 101 104  98  Temp: 98 F (36.7 C)   99.5 F (37.5 C)  TempSrc: Oral   Oral  Resp: 24   18  Height:      Weight:   184 lb 6.4 oz (83.643 kg)   SpO2: 92%   95%    Last BM Date: 09/16/13  Intake/Output   Yesterday:  03/18 0701 - 03/19 0700 In: 1863.3 [P.O.:360; I.V.:1203.3; IV Piggyback:300] Out: 1935 [Urine:1885; Drains:50] This shift:  Total I/O In: -  Out: 900 [Urine:900]  Physical Exam:  General: Pt awake/alert/oriented x3 in no acute distress  Chest: cta No chest wall pain w good excursion  CV: Pulses intact. Regular rhythm  MS: Normal AROM mjr joints. No obvious deformity  Abdomen: Soft. Nondistended. Nontender. RUQ perc chole drain with bilious output. No evidence of peritonitis. No incarcerated hernias.    Problem List:   Principal Problem:   Sepsis, Gram negative Active Problems:   GERD   Fatigue   Cholecystitis   Acute febrile illness   CAD (coronary artery disease)   Ascending cholangitis    Results:   Labs: Results for orders placed during the hospital encounter of 09/17/13 (from the past 48 hour(s))  CBC WITH DIFFERENTIAL     Status: Abnormal   Collection Time    09/17/13  2:59 PM      Result Value Ref Range   WBC 10.5  4.0 - 10.5 K/uL   RBC 4.80  4.22 - 5.81 MIL/uL   Hemoglobin 14.2  13.0 - 17.0 g/dL   HCT 41.3  39.0 - 52.0 %   MCV 86.0  78.0 - 100.0 fL   MCH 29.6  26.0 - 34.0 pg   MCHC 34.4  30.0 - 36.0 g/dL   RDW 13.8  11.5 - 15.5 %   Platelets 113 (*) 150 - 400 K/uL   Neutrophils Relative % 97 (*) 43 - 77 %   Neutro Abs 10.1 (*) 1.7 - 7.7 K/uL   Lymphocytes Relative 2 (*) 12 - 46 %   Lymphs Abs 0.2 (*) 0.7 - 4.0 K/uL   Monocytes  Relative 2 (*) 3 - 12 %   Monocytes Absolute 0.2  0.1 - 1.0 K/uL   Eosinophils Relative 0  0 - 5 %   Eosinophils Absolute 0.0  0.0 - 0.7 K/uL   Basophils Relative 0  0 - 1 %   Basophils Absolute 0.0  0.0 - 0.1 K/uL  COMPREHENSIVE METABOLIC PANEL     Status: Abnormal   Collection Time    09/17/13  2:59 PM      Result Value Ref Range   Sodium 137  137 - 147 mEq/L   Potassium 3.9  3.7 - 5.3 mEq/L   Chloride 96  96 - 112 mEq/L   CO2 24  19 - 32 mEq/L   Glucose, Bld 126 (*) 70 - 99 mg/dL   BUN 16  6 - 23 mg/dL   Creatinine, Ser 0.83  0.50 - 1.35 mg/dL   Calcium 9.5  8.4 - 10.5 mg/dL   Total Protein 7.1  6.0 - 8.3 g/dL   Albumin 3.5    3.5 - 5.2 g/dL   AST 253 (*) 0 - 37 U/L   ALT 253 (*) 0 - 53 U/L   Alkaline Phosphatase 236 (*) 39 - 117 U/L   Total Bilirubin 6.0 (*) 0.3 - 1.2 mg/dL   GFR calc non Af Amer 85 (*) >90 mL/min   GFR calc Af Amer >90  >90 mL/min   Comment: (NOTE)     The eGFR has been calculated using the CKD EPI equation.     This calculation has not been validated in all clinical situations.     eGFR's persistently <90 mL/min signify possible Chronic Kidney     Disease.  LIPASE, BLOOD     Status: None   Collection Time    09/17/13  2:59 PM      Result Value Ref Range   Lipase 19  11 - 59 U/L  TROPONIN I     Status: None   Collection Time    09/17/13  2:59 PM      Result Value Ref Range   Troponin I <0.30  <0.30 ng/mL   Comment:            Due to the release kinetics of cTnI,     a negative result within the first hours     of the onset of symptoms does not rule out     myocardial infarction with certainty.     If myocardial infarction is still suspected,     repeat the test at appropriate intervals.  PRO B NATRIURETIC PEPTIDE     Status: Abnormal   Collection Time    09/17/13  2:59 PM      Result Value Ref Range   Pro B Natriuretic peptide (BNP) 756.4 (*) 0 - 125 pg/mL  PROTIME-INR     Status: None   Collection Time    09/17/13  2:59 PM      Result Value  Ref Range   Prothrombin Time 14.8  11.6 - 15.2 seconds   INR 1.19  0.00 - 1.49  CULTURE, BLOOD (ROUTINE X 2)     Status: None   Collection Time    09/17/13  3:10 PM      Result Value Ref Range   Specimen Description BLOOD ARM RIGHT     Special Requests BOTTLES DRAWN AEROBIC AND ANAEROBIC 5CC     Culture  Setup Time       Value: 09/17/2013 18:52     Performed at [facility]   Culture       Value: GRAM NEGATIVE RODS     Note: Gram Stain Report Called to,Read Back By and Verified With: [place] USSERY AT 4:41 A.M. ON 09/18/13 WARRB     Performed at [facility]   Report Status PENDING    CULTURE, BLOOD (ROUTINE X 2)     Status: None   Collection Time    09/17/13  3:16 PM      Result Value Ref Range   Specimen Description BLOOD RIGHT FOREARM     Special Requests BOTTLES DRAWN AEROBIC AND ANAEROBIC 5CC     Culture  Setup Time       Value: 09/17/2013 18:53     Performed at [facility]   Culture       Value: GRAM NEGATIVE RODS     Note: Gram Stain Report Called to,Read Back By and Verified With: [place] USSERY 09/18/13 0624A [facility]     Performed at [facility]  Report Status PENDING    I-STAT TROPOININ, ED     Status: None   Collection Time    09/17/13  3:25 PM      Result Value Ref Range   Troponin i, poc 0.02  0.00 - 0.08 ng/mL   Comment 3            Comment: Due to the release kinetics of cTnI,     a negative result within the first hours     of the onset of symptoms does not rule out     myocardial infarction with certainty.     If myocardial infarction is still suspected,     repeat the test at appropriate intervals.  I-STAT CG4 LACTIC ACID, ED     Status: None   Collection Time    09/17/13  3:27 PM      Result Value Ref Range   Lactic Acid, Venous 1.37  0.5 - 2.2 mmol/L  I-STAT CHEM 8, ED     Status: Abnormal   Collection Time    09/17/13  3:37 PM      Result Value Ref Range   Sodium 138  137 - 147 mEq/L   Potassium 3.7  3.7 - 5.3 mEq/L    Chloride 98  96 - 112 mEq/L   BUN 15  6 - 23 mg/dL   Creatinine, Ser 1.00  0.50 - 1.35 mg/dL   Glucose, Bld 128 (*) 70 - 99 mg/dL   Calcium, Ion 1.10 (*) 1.13 - 1.30 mmol/L   TCO2 25  0 - 100 mmol/L   Hemoglobin 15.0  13.0 - 17.0 g/dL   HCT 44.0  39.0 - 52.0 %  URINALYSIS, ROUTINE W REFLEX MICROSCOPIC     Status: Abnormal   Collection Time    09/17/13  4:10 PM      Result Value Ref Range   Color, Urine AMBER (*) YELLOW   Comment: BIOCHEMICALS MAY BE AFFECTED BY COLOR   APPearance CLEAR  CLEAR   Specific Gravity, Urine 1.014  1.005 - 1.030   pH 6.5  5.0 - 8.0   Glucose, UA NEGATIVE  NEGATIVE mg/dL   Hgb urine dipstick NEGATIVE  NEGATIVE   Bilirubin Urine MODERATE (*) NEGATIVE   Ketones, ur NEGATIVE  NEGATIVE mg/dL   Protein, ur NEGATIVE  NEGATIVE mg/dL   Urobilinogen, UA 2.0 (*) 0.0 - 1.0 mg/dL   Nitrite NEGATIVE  NEGATIVE   Leukocytes, UA NEGATIVE  NEGATIVE   Comment: MICROSCOPIC NOT DONE ON URINES WITH NEGATIVE PROTEIN, BLOOD, LEUKOCYTES, NITRITE, OR GLUCOSE <1000 mg/dL.  URINE CULTURE     Status: None   Collection Time    09/17/13  4:10 PM      Result Value Ref Range   Specimen Description URINE, CATHETERIZED     Special Requests NONE     Culture  Setup Time       Value: 09/17/2013 16:58     Performed at [facility]       Value: NO GROWTH     Performed at [facility]   Culture       Value: NO GROWTH     Performed at [facility]   Report Status 09/18/2013 FINAL    CBG MONITORING, ED     Status: Abnormal   Collection Time    09/17/13  4:14 PM      Result Value Ref Range   Glucose-Capillary 110 (*) 70 - 99 mg/dL  I-STAT ARTERIAL BLOOD GAS, ED     Status: Abnormal   Collection Time    09/17/13  4:19 PM      Result Value Ref Range   pH, Arterial 7.402  7.350 - 7.450   pCO2 arterial 39.1  35.0 - 45.0 mmHg   pO2, Arterial 76.0 (*) 80.0 - 100.0 mmHg   Bicarbonate 24.3 (*) 20.0 - 24.0 mEq/L   TCO2 26  0 - 100 mmol/L   O2  Saturation 95.0     Collection site BRACHIAL ARTERY     Drawn by Operator     Sample type ARTERIAL    I-STAT CG4 LACTIC ACID, ED     Status: None   Collection Time    09/17/13  6:42 PM      Result Value Ref Range   Lactic Acid, Venous 0.97  0.5 - 2.2 mmol/L  LACTIC ACID, PLASMA     Status: None   Collection Time    09/17/13  9:30 PM      Result Value Ref Range   Lactic Acid, Venous 1.0  0.5 - 2.2 mmol/L  MRSA PCR SCREENING     Status: None   Collection Time    09/17/13 11:10 PM      Result Value Ref Range   MRSA by PCR NEGATIVE  NEGATIVE   Comment:            The GeneXpert MRSA Assay (FDA     approved for NASAL specimens     only), is one component of a     comprehensive MRSA colonization     surveillance program. It is not     intended to diagnose MRSA     infection nor to guide or     monitor treatment for     MRSA infections.  BASIC METABOLIC PANEL     Status: Abnormal   Collection Time    09/18/13  2:29 AM      Result Value Ref Range   Sodium 141  137 - 147 mEq/L   Potassium 4.8  3.7 - 5.3 mEq/L   Comment: DELTA CHECK NOTED   Chloride 104  96 - 112 mEq/L   CO2 22  19 - 32 mEq/L   Glucose, Bld 128 (*) 70 - 99 mg/dL   BUN 17  6 - 23 mg/dL   Creatinine, Ser 1.20  0.50 - 1.35 mg/dL   Calcium 8.3 (*) 8.4 - 10.5 mg/dL   GFR calc non Af Amer 58 (*) >90 mL/min   GFR calc Af Amer 67 (*) >90 mL/min   Comment: (NOTE)     The eGFR has been calculated using the CKD EPI equation.     This calculation has not been validated in all clinical situations.     eGFR's persistently <90 mL/min signify possible Chronic Kidney     Disease.  CBC     Status: Abnormal   Collection Time    09/18/13  2:29 AM      Result Value Ref Range   WBC 11.7 (*) 4.0 - 10.5 K/uL   RBC 3.98 (*) 4.22 - 5.81 MIL/uL   Hemoglobin 11.8 (*) 13.0 - 17.0 g/dL   Comment: DELTA CHECK NOTED     REPEATED TO VERIFY   HCT 34.8 (*) 39.0 - 52.0 %   MCV 87.4  78.0 - 100.0 fL   MCH 29.6  26.0 - 34.0 pg   MCHC  33.9  30.0 - 36.0 g/dL   RDW  14.1  11.5 - 15.5 %   Platelets 110 (*) 150 - 400 K/uL   Comment: CONSISTENT WITH PREVIOUS RESULT  HEPATIC FUNCTION PANEL     Status: Abnormal   Collection Time    09/18/13  2:29 AM      Result Value Ref Range   Total Protein 5.7 (*) 6.0 - 8.3 g/dL   Albumin 2.8 (*) 3.5 - 5.2 g/dL   AST 112 (*) 0 - 37 U/L   ALT 166 (*) 0 - 53 U/L   Alkaline Phosphatase 169 (*) 39 - 117 U/L   Total Bilirubin 5.9 (*) 0.3 - 1.2 mg/dL   Bilirubin, Direct 4.8 (*) 0.0 - 0.3 mg/dL   Indirect Bilirubin 1.1 (*) 0.3 - 0.9 mg/dL    Imaging / Studies: Ct Abdomen Pelvis Wo Contrast  09/17/2013   CLINICAL DATA:  Patient is lethargic.  Generalized weakness.  EXAM: CT ABDOMEN AND PELVIS WITHOUT CONTRAST  TECHNIQUE: Multidetector CT imaging of the abdomen and pelvis was performed following the standard protocol without intravenous contrast.  COMPARISON:  IR PERC CHOLECYSTOSTOMY dated 07/17/2013; CT ABD/PELV WO CM dated 02/05/2012; US ABDOMEN LIMITED dated 07/15/2013  FINDINGS: The lung bases are clear.  No renal, ureteral, or bladder calculi. No obstructive uropathy. No perinephric stranding is seen. The kidneys are symmetric in size without evidence for exophytic mass. There is a Foley catheter present within the bladder. The prostate gland is mildly enlarged measuring 6.4 cm x 4.5 cm in greatest transverse dimension.  The gallbladder wall is thickened. There is a percutaneous cholecystostomy tube coiled within the gallbladder which is decompressed.  The liver demonstrates no focal abnormality. The spleen demonstrates no focal abnormality. The adrenal glands and pancreas are normal.  The stomach, duodenum, small intestine and large intestine are unremarkable. There is no bowel dilatation to suggest obstruction. There is no pneumoperitoneum, pneumatosis, or portal venous gas. There is no abdominal or pelvic free fluid. There is no lymphadenopathy.  The abdominal aorta is normal in caliber with  atherosclerosis.  There is severe degenerative disc disease at L5-S1 with disc height loss. There is mild degenerative disc disease at L4-5. There is bilateral foraminal stenosis at L5-S1.  IMPRESSION:  1. Gallbladder wall thickening with a percutaneous cholecystostomy tube within the gallbladder in satisfactory position. 2. Foley catheter within the bladder in satisfactory position. 3. No bowel obstruction.   Electronically Signed   By: Hetal  Patel   On: 09/17/2013 18:32   Ct Head Wo Contrast  09/17/2013   CLINICAL DATA:  Generalized weakness  EXAM: CT HEAD WITHOUT CONTRAST  TECHNIQUE: Contiguous axial images were obtained from the base of the skull through the vertex without intravenous contrast.  COMPARISON:  None.  FINDINGS: There is no evidence of mass effect, midline shift or extra-axial fluid collections. There is no evidence of a space-occupying lesion or intracranial hemorrhage. There is no evidence of a cortical-based area of acute infarction. Mild periventricular white matter low attenuation likely secondary to microangiopathy.  The ventricles and sulci are appropriate for the patient's age. The basal cisterns are patent.  Visualized portions of the orbits are unremarkable. The visualized portions of the paranasal sinuses and mastoid air cells are unremarkable.  The osseous structures are unremarkable.  IMPRESSION: No acute intracranial pathology.   Electronically Signed   By: Hetal  Patel   On: 09/17/2013 18:25   Ir Cholan Exist Tube  09/18/2013   CLINICAL DATA:  Cholecystitis.  Cholecystostomy tube in place  EXAM: CHOLANGIOGRAM VIA EXISTING   CATHETER  MEDICATIONS: None.  CONTRAST:  66m OMNIPAQUE IOHEXOL 300 MG/ML  SOLN  FLUOROSCOPY TIME:  24 seconds.  PROCEDURE: Contrast was injected into the cholecystostomy tube and imaging was obtained.  Complications: None.  FINDINGS: Contrast fills the gallbladder, cystic duct, common bile duct, and duodenum compatible with patency. Several gallstones are  seen within the lumen of the gallbladder.  IMPRESSION: Cystic and common bile ducts are patent.  Cholelithiasis.   Electronically Signed   By: AMaryclare BeanM.D.   On: 09/18/2013 17:10   Dg Chest Port 1 View  09/17/2013   CLINICAL DATA:  Sepsis, weakness  EXAM: PORTABLE CHEST - 1 VIEW  COMPARISON:  DG CHEST 1 VIEW dated 07/17/2013; DG CHEST 2 VIEW dated 07/15/2013; DG CHEST 2 VIEW dated 06/21/2012  FINDINGS: The heart size and mediastinal contours are within normal limits. Both lungs are clear. The visualized skeletal structures are unremarkable.  IMPRESSION: No active disease.   Electronically Signed   By: RSkipper ClicheM.D.   On: 09/17/2013 15:35    Medications / Allergies: per chart  Antibiotics: Anti-infectives   Start     Dose/Rate Route Frequency Ordered Stop   09/18/13 0400  vancomycin (VANCOCIN) IVPB 1000 mg/200 mL premix  Status:  Discontinued     1,000 mg 200 mL/hr over 60 Minutes Intravenous Every 12 hours 09/17/13 1557 09/18/13 1324   09/17/13 2330  piperacillin-tazobactam (ZOSYN) IVPB 3.375 g  Status:  Discontinued     3.375 g 12.5 mL/hr over 240 Minutes Intravenous Every 8 hours 09/17/13 1557 09/17/13 2016   09/17/13 2200  ceFEPIme (MAXIPIME) 2 g in dextrose 5 % 50 mL IVPB     2 g 100 mL/hr over 30 Minutes Intravenous Every 12 hours 09/17/13 2016     09/17/13 1515  piperacillin-tazobactam (ZOSYN) IVPB 3.375 g     3.375 g 100 mL/hr over 30 Minutes Intravenous  Once 09/17/13 1505 09/17/13 1625   09/17/13 1515  vancomycin (VANCOCIN) IVPB 1000 mg/200 mL premix  Status:  Discontinued     1,000 mg 200 mL/hr over 60 Minutes Intravenous  Once 09/17/13 1505 09/17/13 1510   09/17/13 1515  vancomycin (VANCOCIN) 2,000 mg in sodium chloride 0.9 % 500 mL IVPB     2,000 mg 250 mL/hr over 120 Minutes Intravenous  Once 09/17/13 1510 09/17/13 1823      Assessment/Plan  Cholecystitis s/p perc chole drain  Bacteremia--GNR per blood cultures  transaminitis -consider GI consult -await  today's labs -plan for a laparoscopic cholecystectomy tomorrow morning -hold plavix -drain injected by IR 3/18, patent -continue with drain care -continue with Maxipime  -IVF  -He has been surgically cleared by cardiology. S/p cardiac cath 3/11   EErby Pian AHammond Henry HospitalSurgery Pager [phone] [phone] 09/19/2013 8:55 AM

## 2013-09-19 NOTE — Progress Notes (Signed)
PROGRESS NOTE  [name] RUE:[id number] DOB: [birth year] DOA: 09/17/2013 PCP: Crawford Givens, MD  [name] is a 73 y.o. male with multiple comorbidities including coronary artery disease, hypertension, dyslipidemia, morbid obesity, admitted to the surgery service on 07/15/2013, which time he presented with abdominal pain, diagnosed with acute cholecystitis. Given multiple comorbidities, felt to be a high surgical risk for which he underwent percutaneous cholecystostomy drain placement. He was discharged from the surgical service on 07/19/2013. He followed up with cardiology for preoperative clearance. Stress test on her 09/05/2013 revealed reversible ischemia. 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 Cardiology felt that  medical management would be most appropriate, as multiple lesions would not respond well to stenting.   On admission he was noted by his wife to appear acutely ill, lethargic, diaphoretic, having fevers and chills. Patient reported feeling "sick" but could not give more detail. He also reports that he became so weak he was unable to stand on his own. In the ED denies chest pain, shortness of breath, palpitations, diarrhea, dysuria, hematemesis, bright red blood per rectum or melena.     Assessment/Plan:  Sepsis Secondary to ecoli bacteremia Patient now stabilized on IV fluids and antibiotic Tmax 99.5, BP stable.   Ecoli bacteremia 2 of 2 blood cultures are positive from 3/17.  Awaiting sensitivities. Being treated with IV Cefepime Will re-order blood cultures to ensure they are negative before placing PICC order.  Cholecystitis with possible ascending cholangitis Patient had a cholecystostomy tube placed on 07/17/2013 as he was considered not appropriate for surgery at that time. [facility] on board planning cholecystectomy for Friday 3/20. Appreciate GI consultation.   Will continue  with antibiotic therapy.  CAD plavix and aspirin being held in anticipation of surgery 3/20.  Emphysema Currently stable and asymptomatic  HTN Coreg continued.   Imdur being held Hydralazine PRN   HLD  GERD On protonix.      DVT Prophylaxis:  lovenox  Code Status: full Family Communication: wife and daughter in law at bedside. Disposition Plan: home when appropriate.  Will need PT consult after surgery.   Consultants: [facility]  Procedures:    Antibiotics: Anti-infectives   Start     Dose/Rate Route Frequency Ordered Stop   09/20/13 0600  ceFAZolin (ANCEF) IVPB 2 g/50 mL premix     2 g 100 mL/hr over 30 Minutes Intravenous On call to O.R. 09/19/13 1027 09/21/13 0559   09/18/13 0400  vancomycin (VANCOCIN) IVPB 1000 mg/200 mL premix  Status:  Discontinued     1,000 mg 200 mL/hr over 60 Minutes Intravenous Every 12 hours 09/17/13 1557 09/18/13 1324   09/17/13 2330  piperacillin-tazobactam (ZOSYN) IVPB 3.375 g  Status:  Discontinued     3.375 g 12.5 mL/hr over 240 Minutes Intravenous Every 8 hours 09/17/13 1557 09/17/13 2016   09/17/13 2200  ceFEPIme (MAXIPIME) 2 g in dextrose 5 % 50 mL IVPB     2 g 100 mL/hr over 30 Minutes Intravenous Every 12 hours 09/17/13 2016     09/17/13 1515  piperacillin-tazobactam (ZOSYN) IVPB 3.375 g     3.375 g 100 mL/hr over 30 Minutes Intravenous  Once 09/17/13 1505 09/17/13 1625   09/17/13 1515  vancomycin (VANCOCIN) IVPB 1000 mg/200 mL premix  Status:  Discontinued     1,000 mg 200 mL/hr over 60 Minutes Intravenous  Once 09/17/13 1505 09/17/13 1510  09/17/13 1515  vancomycin (VANCOCIN) 2,000 mg in sodium chloride 0.9 % 500 mL IVPB     2,000 mg 250 mL/hr over 120 Minutes Intravenous  Once 09/17/13 1510 09/17/13 1823       HPI/Subjective: No complaints.  Denies pain.  Wife reports he looks less yellow.  Objective: Filed Vitals:   09/18/13 2204 09/19/13 0500 09/19/13 0507 09/19/13 1427  BP: 163/69  162/71 173/88    Pulse: 104  98 88  Temp:   99.5 F (37.5 C) 98.2 F (36.8 C)  TempSrc:   Oral Axillary  Resp:   18 18  Height:      Weight:  83.643 kg (184 lb 6.4 oz)    SpO2:   95% 97%    Intake/Output Summary (Last 24 hours) at 09/19/13 1536 Last data filed at 09/19/13 1149  Gross per 24 hour  Intake 1046.33 ml  Output   2485 ml  Net -1438.67 ml   Filed Weights   09/17/13 1500 09/17/13 2012 09/19/13 0500  Weight: 90.7 kg (199 lb 15.3 oz) 85.4 kg (188 lb 4.4 oz) 83.643 kg (184 lb 6.4 oz)    Exam: General: Well developed, well nourished, NAD, appears stated age  5:  PERR, EOMI, Anicteic Sclera, MMM. No pharyngeal erythema or exudates  Neck: Supple, no JVD, no masses  Cardiovascular: RRR, S1 S2 auscultated, no rubs, murmurs or gallops.   Respiratory: Clear to auscultation bilaterally with equal chest rise  Abdomen: Soft, nontender, nondistended, + bowel sounds, +percutaneous cholecystomy drain. Approx  20 cc of gold bile in bag. Extremities: warm dry without cyanosis clubbing or edema.  Neuro: AAOx3, cranial nerves grossly intact. Strength 5/5 in upper and lower extremities  Skin: Without rashes exudates or nodules.   Psych: Normal affect and demeanor with intact judgement and insight       Data Reviewed: Basic Metabolic Panel:  Recent Labs Lab 09/17/13 1459 09/17/13 1537 09/18/13 0229 09/19/13 0835  NA 137 138 141 143  K 3.9 3.7 4.8 4.4  CL 96 98 104 102  CO2 24  --  22 26  GLUCOSE 126* 128* 128* 98  BUN 16 15 17 21   CREATININE 0.83 1.00 1.20 1.29  CALCIUM 9.5  --  8.3* 8.9   Liver Function Tests:  Recent Labs Lab 09/17/13 1459 09/18/13 0229 09/19/13 0835  AST 253* 112* 50*  ALT 253* 166* 118*  ALKPHOS 236* 169* 162*  BILITOT 6.0* 5.9* 2.5*  PROT 7.1 5.7* 6.6  ALBUMIN 3.5 2.8* 3.0*    Recent Labs Lab 09/17/13 1459  LIPASE 19   CBC:  Recent Labs Lab 09/17/13 1459 09/17/13 1537 09/18/13 0229 09/19/13 0835  WBC 10.5  --  11.7* 6.7  NEUTROABS  10.1*  --   --   --   HGB 14.2 15.0 11.8* 13.2  HCT 41.3 44.0 34.8* 39.5  MCV 86.0  --  87.4 88.2  PLT 113*  --  110* 125*   Cardiac Enzymes:  Recent Labs Lab 09/17/13 1459  TROPONINI <0.30   BNP (last 3 results)  Recent Labs  09/17/13 1459  PROBNP 756.4*   CBG:  Recent Labs Lab 09/17/13 1614  GLUCAP 110*    Recent Results (from the past 240 hour(s))  CULTURE, BLOOD (ROUTINE X 2)     Status: None   Collection Time    09/17/13  3:10 PM      Result Value Ref Range Status   Specimen Description BLOOD ARM RIGHT   Final  Special Requests BOTTLES DRAWN AEROBIC AND ANAEROBIC 5CC   Final   Culture  Setup Time     Final   Value: 09/17/2013 18:52     Performed at [facility]   Culture     Final   Value: ESCHERICHIA COLI     Note: Gram Stain Report Called to,Read Back By and Verified With: [place] USSERY AT 4:41 A.M. ON 09/18/13 WARRB     Performed at [facility]   Report Status PENDING   Incomplete  CULTURE, BLOOD (ROUTINE X 2)     Status: None   Collection Time    09/17/13  3:16 PM      Result Value Ref Range Status   Specimen Description BLOOD RIGHT FOREARM   Final   Special Requests BOTTLES DRAWN AEROBIC AND ANAEROBIC 5CC   Final   Culture  Setup Time     Final   Value: 09/17/2013 18:53     Performed at [facility]   Culture     Final   Value: ESCHERICHIA COLI     Note: Gram Stain Report Called to,Read Back By and Verified With: [place] USSERY 09/18/13 0624A [place]     Performed at [facility]   Report Status PENDING   Incomplete  URINE CULTURE     Status: None   Collection Time    09/17/13  4:10 PM      Result Value Ref Range Status   Specimen Description URINE, CATHETERIZED   Final   Special Requests NONE   Final   Culture  Setup Time     Final   Value: 09/17/2013 16:58     Performed at [facility]     Final   Value: NO GROWTH     Performed at [facility]   Culture     Final   Value: NO  GROWTH     Performed at [facility]   Report Status 09/18/2013 FINAL   Final  MRSA PCR SCREENING     Status: None   Collection Time    09/17/13 11:10 PM      Result Value Ref Range Status   MRSA by PCR NEGATIVE  NEGATIVE Final   Comment:            The GeneXpert MRSA Assay (FDA     approved for NASAL specimens     only), is one component of a     comprehensive MRSA colonization     surveillance program. It is not     intended to diagnose MRSA     infection nor to guide or     monitor treatment for     MRSA infections.     Studies: Ct Abdomen Pelvis Wo Contrast  09/17/2013   CLINICAL DATA:  Patient is lethargic.  Generalized weakness.  EXAM: CT ABDOMEN AND PELVIS WITHOUT CONTRAST  TECHNIQUE: Multidetector CT imaging of the abdomen and pelvis was performed following the standard protocol without intravenous contrast.  COMPARISON:  IR PERC CHOLECYSTOSTOMY dated 07/17/2013; CT ABD/PELV WO CM dated 02/05/2012; US ABDOMEN LIMITED dated 07/15/2013  FINDINGS: The lung bases are clear.  No renal, ureteral, or bladder calculi. No obstructive uropathy. No perinephric stranding is seen. The kidneys are symmetric in size without evidence for exophytic mass. There is a Foley catheter present within the bladder. The prostate gland is mildly enlarged measuring 6.4 cm x 4.5 cm in greatest transverse dimension.  The gallbladder wall  is thickened. There is a percutaneous cholecystostomy tube coiled within the gallbladder which is decompressed.  The liver demonstrates no focal abnormality. The spleen demonstrates no focal abnormality. The adrenal glands and pancreas are normal.  The stomach, duodenum, small intestine and large intestine are unremarkable. There is no bowel dilatation to suggest obstruction. There is no pneumoperitoneum, pneumatosis, or portal venous gas. There is no abdominal or pelvic free fluid. There is no lymphadenopathy.  The abdominal aorta is normal in caliber with atherosclerosis.   There is severe degenerative disc disease at L5-S1 with disc height loss. There is mild degenerative disc disease at L4-5. There is bilateral foraminal stenosis at L5-S1.  IMPRESSION:  1. Gallbladder wall thickening with a percutaneous cholecystostomy tube within the gallbladder in satisfactory position. 2. Foley catheter within the bladder in satisfactory position. 3. No bowel obstruction.   Electronically Signed   By: Kathreen Devoid   On: 09/17/2013 18:32   Ct Head Wo Contrast  09/17/2013   CLINICAL DATA:  Generalized weakness  EXAM: CT HEAD WITHOUT CONTRAST  TECHNIQUE: Contiguous axial images were obtained from the base of the skull through the vertex without intravenous contrast.  COMPARISON:  None.  FINDINGS: There is no evidence of mass effect, midline shift or extra-axial fluid collections. There is no evidence of a space-occupying lesion or intracranial hemorrhage. There is no evidence of a cortical-based area of acute infarction. Mild periventricular white matter low attenuation likely secondary to microangiopathy.  The ventricles and sulci are appropriate for the patient's age. The basal cisterns are patent.  Visualized portions of the orbits are unremarkable. The visualized portions of the paranasal sinuses and mastoid air cells are unremarkable.  The osseous structures are unremarkable.  IMPRESSION: No acute intracranial pathology.   Electronically Signed   By: Kathreen Devoid   On: 09/17/2013 18:25   Pike Tube  09/18/2013   CLINICAL DATA:  Cholecystitis.  Cholecystostomy tube in place  EXAM: CHOLANGIOGRAM VIA EXISTING CATHETER  MEDICATIONS: None.  CONTRAST:  44mL OMNIPAQUE IOHEXOL 300 MG/ML  SOLN  FLUOROSCOPY TIME:  24 seconds.  PROCEDURE: Contrast was injected into the cholecystostomy tube and imaging was obtained.  Complications: None.  FINDINGS: Contrast fills the gallbladder, cystic duct, common bile duct, and duodenum compatible with patency. Several gallstones are seen within the lumen  of the gallbladder.  IMPRESSION: Cystic and common bile ducts are patent.  Cholelithiasis.   Electronically Signed   By: Maryclare Bean M.D.   On: 09/18/2013 17:10    Scheduled Meds: . carvedilol  6.25 mg Oral BID  . [START ON 09/20/2013]  ceFAZolin (ANCEF) IV  2 g Intravenous On Call to OR  . ceFEPime (MAXIPIME) IV  2 g Intravenous Q12H  . chlorhexidine  1 application Topical Once  . enoxaparin (LOVENOX) injection  40 mg Subcutaneous Q24H  . pantoprazole  40 mg Oral Daily  . simvastatin  20 mg Oral q1800  . sodium chloride  3 mL Intravenous Q12H   Continuous Infusions: . sodium chloride 50 mL/hr at 09/18/13 1457    Principal Problem:   Sepsis, Gram negative Active Problems:   GERD   Fatigue   Cholecystitis   Acute febrile illness   CAD (coronary artery disease)   Ascending cholangitis    Karen Kitchens  [facility] Pager [phone]. If 7PM-7AM, please contact night-coverage at [email or web address], password [facility] 09/19/2013, 3:36 PM  LOS: 2 days

## 2013-09-19 NOTE — Progress Notes (Signed)
CHECK LFT.  OR Friday FOR LAP CHOLE IOC.  GI TO SEE FOR ELEVATED BILIRUBIN.

## 2013-09-20 ENCOUNTER — Encounter (HOSPITAL_COMMUNITY)
Admission: EM | Disposition: A | Discharge status: Home Health | Payer: Self-pay | Source: Home / Self Care | Attending: Internal Medicine

## 2013-09-20 ENCOUNTER — Encounter (HOSPITAL_COMMUNITY): Payer: Self-pay | Admitting: Certified Registered"

## 2013-09-20 ENCOUNTER — Inpatient Hospital Stay (HOSPITAL_COMMUNITY): Payer: Medicare Other

## 2013-09-20 ENCOUNTER — Encounter (HOSPITAL_COMMUNITY): Payer: Medicare Other | Admitting: Certified Registered"

## 2013-09-20 ENCOUNTER — Inpatient Hospital Stay (HOSPITAL_COMMUNITY): Payer: Medicare Other | Admitting: Certified Registered"

## 2013-09-20 DIAGNOSIS — K8066 Calculus of gallbladder and bile duct with acute and chronic cholecystitis without obstruction: Secondary | ICD-10-CM

## 2013-09-20 DIAGNOSIS — K5730 Diverticulosis of large intestine without perforation or abscess without bleeding: Secondary | ICD-10-CM

## 2013-09-20 HISTORY — PX: CHOLECYSTECTOMY: SHX55

## 2013-09-20 LAB — SURGICAL PCR SCREEN
MRSA, PCR: NEGATIVE
STAPHYLOCOCCUS AUREUS: NEGATIVE

## 2013-09-20 LAB — COMPREHENSIVE METABOLIC PANEL
ALT: 88 U/L — ABNORMAL HIGH (ref 0–53)
AST: 35 U/L (ref 0–37)
Albumin: 2.8 g/dL — ABNORMAL LOW (ref 3.5–5.2)
Alkaline Phosphatase: 169 U/L — ABNORMAL HIGH (ref 39–117)
BUN: 16 mg/dL (ref 6–23)
CALCIUM: 8.9 mg/dL (ref 8.4–10.5)
CO2: 27 meq/L (ref 19–32)
CREATININE: 1.1 mg/dL (ref 0.50–1.35)
Chloride: 101 mEq/L (ref 96–112)
GFR calc Af Amer: 74 mL/min — ABNORMAL LOW (ref >90)
GFR, EST NON AFRICAN AMERICAN: 64 mL/min — AB (ref >90)
GLUCOSE: 103 mg/dL — AB (ref 70–99)
Potassium: 4 mEq/L (ref 3.7–5.3)
Sodium: 140 mEq/L (ref 137–147)
Total Bilirubin: 1.9 mg/dL — ABNORMAL HIGH (ref 0.3–1.2)
Total Protein: 6.4 g/dL (ref 6.0–8.3)

## 2013-09-20 SURGERY — LAPAROSCOPIC CHOLECYSTECTOMY WITH INTRAOPERATIVE CHOLANGIOGRAM
Anesthesia: General

## 2013-09-20 MED ORDER — ETOMIDATE 2 MG/ML IV SOLN
INTRAVENOUS | Status: DC | PRN
  Administered 2013-09-20: 14 mg via INTRAVENOUS

## 2013-09-20 MED ORDER — FENTANYL CITRATE 0.05 MG/ML IJ SOLN
INTRAMUSCULAR | Status: DC | PRN
  Administered 2013-09-20: 50 ug via INTRAVENOUS
  Administered 2013-09-20: 100 ug via INTRAVENOUS

## 2013-09-20 MED ORDER — NEOSTIGMINE METHYLSULFATE 1 MG/ML IJ SOLN
INTRAMUSCULAR | Status: AC
  Filled 2013-09-20: qty 10

## 2013-09-20 MED ORDER — HYDROMORPHONE HCL PF 1 MG/ML IJ SOLN
1.0000 mg | INTRAMUSCULAR | Status: DC | PRN
  Administered 2013-09-20: 1 mg via INTRAVENOUS
  Filled 2013-09-20 (×2): qty 1

## 2013-09-20 MED ORDER — BUPIVACAINE-EPINEPHRINE 0.25% -1:200000 IJ SOLN
INTRAMUSCULAR | Status: DC | PRN
  Administered 2013-09-20: 10 mL

## 2013-09-20 MED ORDER — MIDAZOLAM HCL 5 MG/5ML IJ SOLN
INTRAMUSCULAR | Status: DC | PRN
  Administered 2013-09-20 (×2): 1 mg via INTRAVENOUS

## 2013-09-20 MED ORDER — SODIUM CHLORIDE 0.9 % IR SOLN
Status: DC | PRN
  Administered 2013-09-20 (×2): 1000 mL

## 2013-09-20 MED ORDER — ROCURONIUM BROMIDE 100 MG/10ML IV SOLN
INTRAVENOUS | Status: DC | PRN
  Administered 2013-09-20: 40 mg via INTRAVENOUS

## 2013-09-20 MED ORDER — LIDOCAINE HCL (CARDIAC) 20 MG/ML IV SOLN
INTRAVENOUS | Status: AC
  Filled 2013-09-20: qty 5

## 2013-09-20 MED ORDER — ONDANSETRON HCL 4 MG/2ML IJ SOLN
INTRAMUSCULAR | Status: DC | PRN
  Administered 2013-09-20: 4 mg via INTRAVENOUS

## 2013-09-20 MED ORDER — SODIUM CHLORIDE 0.9 % IV SOLN
INTRAVENOUS | Status: DC | PRN
  Administered 2013-09-20: 11:00:00

## 2013-09-20 MED ORDER — FENTANYL CITRATE 0.05 MG/ML IJ SOLN: 25.0000 ug | INTRAMUSCULAR | Status: DC | PRN

## 2013-09-20 MED ORDER — ALBUMIN HUMAN 5 % IV SOLN
INTRAVENOUS | Status: DC | PRN
  Administered 2013-09-20: 10:00:00 via INTRAVENOUS

## 2013-09-20 MED ORDER — GLYCOPYRROLATE 0.2 MG/ML IJ SOLN
INTRAMUSCULAR | Status: DC | PRN
Start: 2013-09-20 — End: 2013-09-20
  Administered 2013-09-20: 0.6 mg via INTRAVENOUS

## 2013-09-20 MED ORDER — FENTANYL CITRATE 0.05 MG/ML IJ SOLN
INTRAMUSCULAR | Status: AC
  Filled 2013-09-20: qty 5

## 2013-09-20 MED ORDER — MIDAZOLAM HCL 2 MG/2ML IJ SOLN
INTRAMUSCULAR | Status: AC
  Filled 2013-09-20: qty 2

## 2013-09-20 MED ORDER — LACTATED RINGERS IV SOLN: INTRAVENOUS | Status: DC

## 2013-09-20 MED ORDER — 0.9 % SODIUM CHLORIDE (POUR BTL) OPTIME
TOPICAL | Status: DC | PRN
  Administered 2013-09-20: 1000 mL

## 2013-09-20 MED ORDER — FENTANYL CITRATE 0.05 MG/ML IJ SOLN
INTRAMUSCULAR | Status: AC
  Filled 2013-09-20: qty 2

## 2013-09-20 MED ORDER — HEMOSTATIC AGENTS (NO CHARGE) OPTIME
TOPICAL | Status: DC | PRN
  Administered 2013-09-20: 1 via TOPICAL

## 2013-09-20 MED ORDER — PHENYLEPHRINE HCL 10 MG/ML IJ SOLN
INTRAMUSCULAR | Status: DC | PRN
  Administered 2013-09-20 (×6): 40 ug via INTRAVENOUS

## 2013-09-20 MED ORDER — NEOSTIGMINE METHYLSULFATE 1 MG/ML IJ SOLN
INTRAMUSCULAR | Status: DC | PRN
  Administered 2013-09-20: 4 mg via INTRAVENOUS

## 2013-09-20 MED ORDER — LIDOCAINE HCL (CARDIAC) 20 MG/ML IV SOLN
INTRAVENOUS | Status: DC | PRN
  Administered 2013-09-20: 80 mg via INTRAVENOUS

## 2013-09-20 MED ORDER — GLYCOPYRROLATE 0.2 MG/ML IJ SOLN
INTRAMUSCULAR | Status: AC
  Filled 2013-09-20: qty 3

## 2013-09-20 MED ORDER — BUPIVACAINE-EPINEPHRINE (PF) 0.25% -1:200000 IJ SOLN
INTRAMUSCULAR | Status: AC
  Filled 2013-09-20: qty 30

## 2013-09-20 SURGICAL SUPPLY — 52 items
ADH SKN CLS APL DERMABOND .7 (GAUZE/BANDAGES/DRESSINGS) ×1
APPLIER CLIP ROT 10 11.4 M/L (STAPLE) ×3
APR CLP MED LRG 11.4X10 (STAPLE) ×1
BAG SPEC RTRVL LRG 6X4 10 (ENDOMECHANICALS) ×1
BLADE SURG ROTATE 9660 (MISCELLANEOUS) IMPLANT
CANISTER SUCTION 2500CC (MISCELLANEOUS) ×3 IMPLANT
CHLORAPREP W/TINT 26ML (MISCELLANEOUS) ×3 IMPLANT
CLIP APPLIE ROT 10 11.4 M/L (STAPLE) ×1 IMPLANT
COVER MAYO STAND STRL (DRAPES) ×3 IMPLANT
COVER SURGICAL LIGHT HANDLE (MISCELLANEOUS) ×3 IMPLANT
DECANTER SPIKE VIAL GLASS SM (MISCELLANEOUS) ×6 IMPLANT
DERMABOND ADVANCED (GAUZE/BANDAGES/DRESSINGS) ×2
DERMABOND ADVANCED .7 DNX12 (GAUZE/BANDAGES/DRESSINGS) ×1 IMPLANT
DRAIN CHANNEL 19F RND (DRAIN) ×2 IMPLANT
DRAPE C-ARM 42X72 X-RAY (DRAPES) ×3 IMPLANT
DRAPE UTILITY 15X26 W/TAPE STR (DRAPE) ×6 IMPLANT
DRAPE WARM FLUID 44X44 (DRAPE) ×3 IMPLANT
ELECT REM PT RETURN 9FT ADLT (ELECTROSURGICAL) ×3
ELECTRODE REM PT RTRN 9FT ADLT (ELECTROSURGICAL) ×1 IMPLANT
ENDOLOOP SUT PDS II  0 18 (SUTURE) ×2
ENDOLOOP SUT PDS II 0 18 (SUTURE) IMPLANT
EVACUATOR SILICONE 100CC (DRAIN) ×2 IMPLANT
GLOVE BIO SURGEON STRL SZ7.5 (GLOVE) ×2 IMPLANT
GLOVE BIO SURGEON STRL SZ8 (GLOVE) ×3 IMPLANT
GLOVE BIOGEL PI IND STRL 8 (GLOVE) ×1 IMPLANT
GLOVE BIOGEL PI IND STRL 8.5 (GLOVE) IMPLANT
GLOVE BIOGEL PI INDICATOR 8 (GLOVE) ×6
GLOVE BIOGEL PI INDICATOR 8.5 (GLOVE) ×4
GOWN STRL REUS W/ TWL LRG LVL3 (GOWN DISPOSABLE) ×4 IMPLANT
GOWN STRL REUS W/ TWL XL LVL3 (GOWN DISPOSABLE) ×1 IMPLANT
GOWN STRL REUS W/TWL LRG LVL3 (GOWN DISPOSABLE) ×6
GOWN STRL REUS W/TWL XL LVL3 (GOWN DISPOSABLE) ×9
KIT BASIN OR (CUSTOM PROCEDURE TRAY) ×3 IMPLANT
KIT ROOM TURNOVER OR (KITS) ×3 IMPLANT
NS IRRIG 1000ML POUR BTL (IV SOLUTION) ×6 IMPLANT
PAD ARMBOARD 7.5X6 YLW CONV (MISCELLANEOUS) ×3 IMPLANT
POUCH SPECIMEN RETRIEVAL 10MM (ENDOMECHANICALS) ×3 IMPLANT
SCISSORS LAP 5X35 DISP (ENDOMECHANICALS) ×3 IMPLANT
SET CHOLANGIOGRAPH 5 50 .035 (SET/KITS/TRAYS/PACK) ×3 IMPLANT
SET IRRIG TUBING LAPAROSCOPIC (IRRIGATION / IRRIGATOR) ×3 IMPLANT
SLEEVE ENDOPATH XCEL 5M (ENDOMECHANICALS) ×3 IMPLANT
SPECIMEN JAR SMALL (MISCELLANEOUS) ×3 IMPLANT
SPONGE GAUZE 4X4 12PLY STER LF (GAUZE/BANDAGES/DRESSINGS) ×2 IMPLANT
SUT ETHILON 2 0 FS 18 (SUTURE) ×2 IMPLANT
SUT MNCRL AB 4-0 PS2 18 (SUTURE) ×3 IMPLANT
TAPE CLOTH SURG 6X10 WHT LF (GAUZE/BANDAGES/DRESSINGS) ×2 IMPLANT
TOWEL OR 17X24 6PK STRL BLUE (TOWEL DISPOSABLE) ×3 IMPLANT
TOWEL OR 17X26 10 PK STRL BLUE (TOWEL DISPOSABLE) ×3 IMPLANT
TRAY LAPAROSCOPIC (CUSTOM PROCEDURE TRAY) ×3 IMPLANT
TROCAR XCEL BLUNT TIP 100MML (ENDOMECHANICALS) ×3 IMPLANT
TROCAR XCEL NON-BLD 11X100MML (ENDOMECHANICALS) ×3 IMPLANT
TROCAR XCEL NON-BLD 5MMX100MML (ENDOMECHANICALS) ×3 IMPLANT

## 2013-09-20 NOTE — Anesthesia Postprocedure Evaluation (Signed)
  Anesthesia Post-op Note  Patient: [name]  Procedure(s) Performed: Procedure(s): LAPAROSCOPIC CHOLECYSTECTOMY WITH INTRAOPERATIVE CHOLANGIOGRAM (N/A)  Patient Location: PACU  Anesthesia Type:General  Level of Consciousness: awake  Airway and Oxygen Therapy: Patient Spontanous Breathing  Post-op Pain: mild  Post-op Assessment: Post-op Vital signs reviewed  Post-op Vital Signs: Reviewed  Complications: No apparent anesthesia complications

## 2013-09-20 NOTE — Op Note (Signed)
Laparoscopic Cholecystectomy with IOC Procedure Note  Indications: This patient presents with symptomatic gallbladder disease and will undergo laparoscopic cholecystectomy. Pt has had a percutaneous drain since mid January.  He presents for laparoscopic cholecystectomy for chronic cholecystitis. The procedure has been discussed with the patient. Operative and non operative treatments have been discussed. Risks of surgery include bleeding, infection,  Common bile duct injury,  Injury to the stomach,liver, colon,small intestine, abdominal wall,  Diaphragm,  Major blood vessels,  And the need for an open procedure.  Other risks include worsening of medical problems, death,  DVT and pulmonary embolism, and cardiovascular events.   Medical options have also been discussed. The patient has been informed of long term expectations of surgery and non surgical options,  The patient agrees to proceed.    Pre-operative Diagnosis: chronic cholecystitis  Post-operative Diagnosis: Same  Surgeon: Keegen Heffern A.   Assistants: Georganna Skeans MD  Anesthesia: General endotracheal anesthesia and Local anesthesia 0.25.% bupivacaine, with epinephrine  ASA Class: 3  Procedure Details  The patient was seen again in the Holding Room. The risks, benefits, complications, treatment options, and expected outcomes were discussed with the patient. The possibilities of reaction to medication, pulmonary aspiration, perforation of viscus, bleeding, recurrent infection, finding a normal gallbladder, the need for additional procedures, failure to diagnose a condition, the possible need to convert to an open procedure, and creating a complication requiring transfusion or operation were discussed with the patient. The patient and/or family concurred with the proposed plan, giving informed consent. The site of surgery properly noted/marked. The patient was taken to Operating Room, identified as ANTOINO WESTHOFF and the procedure  verified as Laparoscopic Cholecystectomy with Intraoperative Cholangiograms. A Time Out was held and the above information confirmed.  Prior to the induction of general anesthesia, antibiotic prophylaxis was administered. Time out done. Percutaneous drain removed.  General endotracheal anesthesia was then administered and tolerated well. After the induction, the abdomen was prepped in the usual sterile fashion. The patient was positioned in the supine position with the left arm comfortably tucked, along with some reverse Trendelenburg.  Local anesthetic agent was injected into the skin near the umbilicus and an incision made. The midline fascia was incised and the Hasson technique was used to introduce a 12 mm port under direct vision. It was secured with a figure of eight Vicryl suture placed in the usual fashion. Pneumoperitoneum was then created with CO2 and tolerated well without any adverse changes in the patient's vital signs. Additional trocars were introduced under direct vision with an 11 mm trocar in the epigastrium and two  5 mm trocars in the right upper quadrant. All skin incisions were infiltrated with a local anesthetic agent before making the incision and placing the trocars.   The gallbladder was identified, the fundus grasped and retracted cephalad.There was severe chronic inflammation and necrotic gallbladder wall.   Dense Adhesions were lysed bluntly and with the electrocautery where indicated, taking care not to injure any adjacent organs or viscus. The infundibulum was grasped and retracted laterally, exposing the peritoneum overlying the triangle of Calot. This was then divided and exposed in a blunt fashion. The cystic duct was clearly identified and bluntly dissected circumferentially. The junctions of the gallbladder, cystic duct and common bile duct were clearly identified prior to the division of any linear structure.   An incision was made in the cystic duct and the cholangiogram  catheter introduced. The catheter was secured using an endoclip. The study showed no stones and  good visualization of the distal and proximal biliary tree. The catheter was then removed.   The cystic duct was then  ligated with surgical clips  on the patient side and  clipped on the gallbladder side and divided. The cystic artery was identified, dissected free, ligated with clips and divided as well. Posterior cystic artery clipped and divided.  The gallbladder was dissected from the liver bed in retrograde fashion with the electrocautery. This was done piecemeal due to poor condition of the tissue.  Stones were suctioned out as we progressed.  Part of the posterior wall was left behind and cauterized.  The gallbladder was removed. The liver bed was irrigated and inspected. Hemostasis was achieved with the electrocautery. Copious irrigation was utilized and was repeatedly aspirated until clear all particulate matter. Hemostasis was achieved with no signs  Of bleeding or bile leakage. A drain left in the gallbladder fossa and secured to the skin with 4 0 monocryl.  Surgicel left in gallbladder bed.    Pneumoperitoneum was completely reduced after viewing removal of the trocars under direct vision. The wound was thoroughly irrigated and the fascia was then closed with a figure of eight suture; the skin was then closed with 4 0 monocryl. and a sterile dressing was applied.  Instrument, sponge, and needle counts were correct at closure and at the conclusion of the case.   Findings: Cholecystitis with Cholelithiasis  Estimated Blood Loss: less than 100 mL         Drains: 19 F         Total IV Fluids: 600 mL         Specimens: Gallbladder           Complications: None; patient tolerated the procedure well.         Disposition: PACU - hemodynamically stable.         Condition: stable

## 2013-09-20 NOTE — Progress Notes (Signed)
Addendum  Patient seen and examined, chart and data base reviewed.  I agree with the above assessment and plan.  For full details please see Mrs. Imogene Burn PA note.  E coli bacteremia, likely from acute cholangitis/cholecystitis, status post laparoscopic cholecystectomy done today.  Continue antibiotics, start clear liquids.   Birdie Hopes, MD [facility] Pager: [phone] 09/20/2013, 2:14 PM

## 2013-09-20 NOTE — H&P (View-Only) (Signed)
CHECK LFT.  OR Friday FOR LAP CHOLE IOC.  GI TO SEE FOR ELEVATED BILIRUBIN.

## 2013-09-20 NOTE — Anesthesia Procedure Notes (Signed)
Procedure Name: Intubation Date/Time: 09/20/2013 9:37 AM Performed by: Maeola Harman Pre-anesthesia Checklist: Patient identified, Emergency Drugs available, Suction available, Patient being monitored and Timeout performed Patient Re-evaluated:Patient Re-evaluated prior to inductionOxygen Delivery Method: Circle system utilized Preoxygenation: Pre-oxygenation with 100% oxygen Intubation Type: IV induction Ventilation: Mask ventilation without difficulty Laryngoscope Size: Mac and 3 Grade View: Grade I Tube type: Oral Tube size: 7.5 mm Number of attempts: 1 Airway Equipment and Method: Stylet Placement Confirmation: ETT inserted through vocal cords under direct vision,  positive ETCO2 and breath sounds checked- equal and bilateral Secured at: 22 cm Tube secured with: Tape Dental Injury: Teeth and Oropharynx as per pre-operative assessment  Comments: Easy atraumatic induction and intubation with MAC 3 blade.  Dr. Oletta Lamas verified placement.  Waldron Session, CRNA

## 2013-09-20 NOTE — H&P (View-Only) (Signed)
Patient ID: [name], male   DOB: 30-May-1941, 73 y.o.   MRN: [id number]   Subjective: Denies pain.  Afebrile.  No n/v.  Had a BM yesterday.  Adequate UOP.   Objective:  Vital signs:  Filed Vitals:   09/18/13 2051 09/18/13 2204 09/19/13 0500 09/19/13 0507  BP: 181/82 163/69  162/71  Pulse: 101 104  98  Temp: 98 F (36.7 C)   99.5 F (37.5 C)  TempSrc: Oral   Oral  Resp: 24   18  Height:      Weight:   184 lb 6.4 oz (83.643 kg)   SpO2: 92%   95%    Last BM Date: 09/16/13  Intake/Output   Yesterday:  03/18 0701 - 03/19 0700 In: 1863.3 [P.O.:360; I.V.:1203.3; IV Piggyback:300] Out: 4098 [JXBJY:7829; Drains:50] This shift:  Total I/O In: -  Out: 900 [Urine:900]  Physical Exam:  General: Pt awake/alert/oriented x3 in no acute distress  Chest: cta No chest wall pain w good excursion  CV: Pulses intact. Regular rhythm  MS: Normal AROM mjr joints. No obvious deformity  Abdomen: Soft. Nondistended. Nontender. RUQ perc chole drain with bilious output. No evidence of peritonitis. No incarcerated hernias.    Problem List:   Principal Problem:   Sepsis, Gram negative Active Problems:   GERD   Fatigue   Cholecystitis   Acute febrile illness   CAD (coronary artery disease)   Ascending cholangitis    Results:   Labs: Results for orders placed during the hospital encounter of 09/17/13 (from the past 37 hour(s))  CBC WITH DIFFERENTIAL     Status: Abnormal   Collection Time    09/17/13  2:59 PM      Result Value Ref Range   WBC 10.5  4.0 - 10.5 K/uL   RBC 4.80  4.22 - 5.81 MIL/uL   Hemoglobin 14.2  13.0 - 17.0 g/dL   HCT 41.3  39.0 - 52.0 %   MCV 86.0  78.0 - 100.0 fL   MCH 29.6  26.0 - 34.0 pg   MCHC 34.4  30.0 - 36.0 g/dL   RDW 13.8  11.5 - 15.5 %   Platelets 113 (*) 150 - 400 K/uL   Neutrophils Relative % 97 (*) 43 - 77 %   Neutro Abs 10.1 (*) 1.7 - 7.7 K/uL   Lymphocytes Relative 2 (*) 12 - 46 %   Lymphs Abs 0.2 (*) 0.7 - 4.0 K/uL   Monocytes  Relative 2 (*) 3 - 12 %   Monocytes Absolute 0.2  0.1 - 1.0 K/uL   Eosinophils Relative 0  0 - 5 %   Eosinophils Absolute 0.0  0.0 - 0.7 K/uL   Basophils Relative 0  0 - 1 %   Basophils Absolute 0.0  0.0 - 0.1 K/uL  COMPREHENSIVE METABOLIC PANEL     Status: Abnormal   Collection Time    09/17/13  2:59 PM      Result Value Ref Range   Sodium 137  137 - 147 mEq/L   Potassium 3.9  3.7 - 5.3 mEq/L   Chloride 96  96 - 112 mEq/L   CO2 24  19 - 32 mEq/L   Glucose, Bld 126 (*) 70 - 99 mg/dL   BUN 16  6 - 23 mg/dL   Creatinine, Ser 0.83  0.50 - 1.35 mg/dL   Calcium 9.5  8.4 - 10.5 mg/dL   Total Protein 7.1  6.0 - 8.3 g/dL   Albumin 3.5  3.5 - 5.2 g/dL   AST 253 (*) 0 - 37 U/L   ALT 253 (*) 0 - 53 U/L   Alkaline Phosphatase 236 (*) 39 - 117 U/L   Total Bilirubin 6.0 (*) 0.3 - 1.2 mg/dL   GFR calc non Af Amer 85 (*) >90 mL/min   GFR calc Af Amer >90  >90 mL/min   Comment: (NOTE)     The eGFR has been calculated using the CKD EPI equation.     This calculation has not been validated in all clinical situations.     eGFR's persistently <90 mL/min signify possible Chronic Kidney     Disease.  LIPASE, BLOOD     Status: None   Collection Time    09/17/13  2:59 PM      Result Value Ref Range   Lipase 19  11 - 59 U/L  TROPONIN I     Status: None   Collection Time    09/17/13  2:59 PM      Result Value Ref Range   Troponin I <0.30  <0.30 ng/mL   Comment:            Due to the release kinetics of cTnI,     a negative result within the first hours     of the onset of symptoms does not rule out     myocardial infarction with certainty.     If myocardial infarction is still suspected,     repeat the test at appropriate intervals.  PRO B NATRIURETIC PEPTIDE     Status: Abnormal   Collection Time    09/17/13  2:59 PM      Result Value Ref Range   Pro B Natriuretic peptide (BNP) 756.4 (*) 0 - 125 pg/mL  PROTIME-INR     Status: None   Collection Time    09/17/13  2:59 PM      Result Value  Ref Range   Prothrombin Time 14.8  11.6 - 15.2 seconds   INR 1.19  0.00 - 1.49  CULTURE, BLOOD (ROUTINE X 2)     Status: None   Collection Time    09/17/13  3:10 PM      Result Value Ref Range   Specimen Description BLOOD ARM RIGHT     Special Requests BOTTLES DRAWN AEROBIC AND ANAEROBIC 5CC     Culture  Setup Time       Value: 09/17/2013 18:52     Performed at [facility]   Culture       Value: GRAM NEGATIVE RODS     Note: Gram Stain Report Called to,Read Back By and Verified With: [place] USSERY AT 4:41 A.M. ON 09/18/13 WARRB     Performed at [facility]   Report Status PENDING    CULTURE, BLOOD (ROUTINE X 2)     Status: None   Collection Time    09/17/13  3:16 PM      Result Value Ref Range   Specimen Description BLOOD RIGHT FOREARM     Special Requests BOTTLES DRAWN AEROBIC AND ANAEROBIC 5CC     Culture  Setup Time       Value: 09/17/2013 18:53     Performed at [facility]   Culture       Value: GRAM NEGATIVE RODS     Note: Gram Stain Report Called to,Read Back By and Verified With: [place] USSERY 09/18/13 0624A [facility]     Performed at [facility]  Report Status PENDING    I-STAT TROPOININ, ED     Status: None   Collection Time    09/17/13  3:25 PM      Result Value Ref Range   Troponin i, poc 0.02  0.00 - 0.08 ng/mL   Comment 3            Comment: Due to the release kinetics of cTnI,     a negative result within the first hours     of the onset of symptoms does not rule out     myocardial infarction with certainty.     If myocardial infarction is still suspected,     repeat the test at appropriate intervals.  I-STAT CG4 LACTIC ACID, ED     Status: None   Collection Time    09/17/13  3:27 PM      Result Value Ref Range   Lactic Acid, Venous 1.37  0.5 - 2.2 mmol/L  I-STAT CHEM 8, ED     Status: Abnormal   Collection Time    09/17/13  3:37 PM      Result Value Ref Range   Sodium 138  137 - 147 mEq/L   Potassium 3.7  3.7 - 5.3 mEq/L    Chloride 98  96 - 112 mEq/L   BUN 15  6 - 23 mg/dL   Creatinine, Ser 1.00  0.50 - 1.35 mg/dL   Glucose, Bld 128 (*) 70 - 99 mg/dL   Calcium, Ion 1.10 (*) 1.13 - 1.30 mmol/L   TCO2 25  0 - 100 mmol/L   Hemoglobin 15.0  13.0 - 17.0 g/dL   HCT 44.0  39.0 - 52.0 %  URINALYSIS, ROUTINE W REFLEX MICROSCOPIC     Status: Abnormal   Collection Time    09/17/13  4:10 PM      Result Value Ref Range   Color, Urine AMBER (*) YELLOW   Comment: BIOCHEMICALS MAY BE AFFECTED BY COLOR   APPearance CLEAR  CLEAR   Specific Gravity, Urine 1.014  1.005 - 1.030   pH 6.5  5.0 - 8.0   Glucose, UA NEGATIVE  NEGATIVE mg/dL   Hgb urine dipstick NEGATIVE  NEGATIVE   Bilirubin Urine MODERATE (*) NEGATIVE   Ketones, ur NEGATIVE  NEGATIVE mg/dL   Protein, ur NEGATIVE  NEGATIVE mg/dL   Urobilinogen, UA 2.0 (*) 0.0 - 1.0 mg/dL   Nitrite NEGATIVE  NEGATIVE   Leukocytes, UA NEGATIVE  NEGATIVE   Comment: MICROSCOPIC NOT DONE ON URINES WITH NEGATIVE PROTEIN, BLOOD, LEUKOCYTES, NITRITE, OR GLUCOSE <1000 mg/dL.  URINE CULTURE     Status: None   Collection Time    09/17/13  4:10 PM      Result Value Ref Range   Specimen Description URINE, CATHETERIZED     Special Requests NONE     Culture  Setup Time       Value: 09/17/2013 16:58     Performed at [facility]       Value: NO GROWTH     Performed at [facility]   Culture       Value: NO GROWTH     Performed at [facility]   Report Status 09/18/2013 FINAL    CBG MONITORING, ED     Status: Abnormal   Collection Time    09/17/13  4:14 PM      Result Value Ref Range   Glucose-Capillary 110 (*) 70 - 99 mg/dL  I-STAT ARTERIAL BLOOD GAS, ED     Status: Abnormal   Collection Time    09/17/13  4:19 PM      Result Value Ref Range   pH, Arterial 7.402  7.350 - 7.450   pCO2 arterial 39.1  35.0 - 45.0 mmHg   pO2, Arterial 76.0 (*) 80.0 - 100.0 mmHg   Bicarbonate 24.3 (*) 20.0 - 24.0 mEq/L   TCO2 26  0 - 100 mmol/L   O2  Saturation 95.0     Collection site BRACHIAL ARTERY     Drawn by Operator     Sample type ARTERIAL    I-STAT CG4 LACTIC ACID, ED     Status: None   Collection Time    09/17/13  6:42 PM      Result Value Ref Range   Lactic Acid, Venous 0.97  0.5 - 2.2 mmol/L  LACTIC ACID, PLASMA     Status: None   Collection Time    09/17/13  9:30 PM      Result Value Ref Range   Lactic Acid, Venous 1.0  0.5 - 2.2 mmol/L  MRSA PCR SCREENING     Status: None   Collection Time    09/17/13 11:10 PM      Result Value Ref Range   MRSA by PCR NEGATIVE  NEGATIVE   Comment:            The GeneXpert MRSA Assay (FDA     approved for NASAL specimens     only), is one component of a     comprehensive MRSA colonization     surveillance program. It is not     intended to diagnose MRSA     infection nor to guide or     monitor treatment for     MRSA infections.  BASIC METABOLIC PANEL     Status: Abnormal   Collection Time    09/18/13  2:29 AM      Result Value Ref Range   Sodium 141  137 - 147 mEq/L   Potassium 4.8  3.7 - 5.3 mEq/L   Comment: DELTA CHECK NOTED   Chloride 104  96 - 112 mEq/L   CO2 22  19 - 32 mEq/L   Glucose, Bld 128 (*) 70 - 99 mg/dL   BUN 17  6 - 23 mg/dL   Creatinine, Ser 1.20  0.50 - 1.35 mg/dL   Calcium 8.3 (*) 8.4 - 10.5 mg/dL   GFR calc non Af Amer 58 (*) >90 mL/min   GFR calc Af Amer 67 (*) >90 mL/min   Comment: (NOTE)     The eGFR has been calculated using the CKD EPI equation.     This calculation has not been validated in all clinical situations.     eGFR's persistently <90 mL/min signify possible Chronic Kidney     Disease.  CBC     Status: Abnormal   Collection Time    09/18/13  2:29 AM      Result Value Ref Range   WBC 11.7 (*) 4.0 - 10.5 K/uL   RBC 3.98 (*) 4.22 - 5.81 MIL/uL   Hemoglobin 11.8 (*) 13.0 - 17.0 g/dL   Comment: DELTA CHECK NOTED     REPEATED TO VERIFY   HCT 34.8 (*) 39.0 - 52.0 %   MCV 87.4  78.0 - 100.0 fL   MCH 29.6  26.0 - 34.0 pg   MCHC  33.9  30.0 - 36.0 g/dL   RDW  14.1  11.5 - 15.5 %   Platelets 110 (*) 150 - 400 K/uL   Comment: CONSISTENT WITH PREVIOUS RESULT  HEPATIC FUNCTION PANEL     Status: Abnormal   Collection Time    09/18/13  2:29 AM      Result Value Ref Range   Total Protein 5.7 (*) 6.0 - 8.3 g/dL   Albumin 2.8 (*) 3.5 - 5.2 g/dL   AST 112 (*) 0 - 37 U/L   ALT 166 (*) 0 - 53 U/L   Alkaline Phosphatase 169 (*) 39 - 117 U/L   Total Bilirubin 5.9 (*) 0.3 - 1.2 mg/dL   Bilirubin, Direct 4.8 (*) 0.0 - 0.3 mg/dL   Indirect Bilirubin 1.1 (*) 0.3 - 0.9 mg/dL    Imaging / Studies: Ct Abdomen Pelvis Wo Contrast  09/17/2013   CLINICAL DATA:  Patient is lethargic.  Generalized weakness.  EXAM: CT ABDOMEN AND PELVIS WITHOUT CONTRAST  TECHNIQUE: Multidetector CT imaging of the abdomen and pelvis was performed following the standard protocol without intravenous contrast.  COMPARISON:  IR PERC CHOLECYSTOSTOMY dated 07/17/2013; CT ABD/PELV WO CM dated 02/05/2012; US ABDOMEN LIMITED dated 07/15/2013  FINDINGS: The lung bases are clear.  No renal, ureteral, or bladder calculi. No obstructive uropathy. No perinephric stranding is seen. The kidneys are symmetric in size without evidence for exophytic mass. There is a Foley catheter present within the bladder. The prostate gland is mildly enlarged measuring 6.4 cm x 4.5 cm in greatest transverse dimension.  The gallbladder wall is thickened. There is a percutaneous cholecystostomy tube coiled within the gallbladder which is decompressed.  The liver demonstrates no focal abnormality. The spleen demonstrates no focal abnormality. The adrenal glands and pancreas are normal.  The stomach, duodenum, small intestine and large intestine are unremarkable. There is no bowel dilatation to suggest obstruction. There is no pneumoperitoneum, pneumatosis, or portal venous gas. There is no abdominal or pelvic free fluid. There is no lymphadenopathy.  The abdominal aorta is normal in caliber with  atherosclerosis.  There is severe degenerative disc disease at L5-S1 with disc height loss. There is mild degenerative disc disease at L4-5. There is bilateral foraminal stenosis at L5-S1.  IMPRESSION:  1. Gallbladder wall thickening with a percutaneous cholecystostomy tube within the gallbladder in satisfactory position. 2. Foley catheter within the bladder in satisfactory position. 3. No bowel obstruction.   Electronically Signed   By: Kathreen Devoid   On: 09/17/2013 18:32   Ct Head Wo Contrast  09/17/2013   CLINICAL DATA:  Generalized weakness  EXAM: CT HEAD WITHOUT CONTRAST  TECHNIQUE: Contiguous axial images were obtained from the base of the skull through the vertex without intravenous contrast.  COMPARISON:  None.  FINDINGS: There is no evidence of mass effect, midline shift or extra-axial fluid collections. There is no evidence of a space-occupying lesion or intracranial hemorrhage. There is no evidence of a cortical-based area of acute infarction. Mild periventricular white matter low attenuation likely secondary to microangiopathy.  The ventricles and sulci are appropriate for the patient's age. The basal cisterns are patent.  Visualized portions of the orbits are unremarkable. The visualized portions of the paranasal sinuses and mastoid air cells are unremarkable.  The osseous structures are unremarkable.  IMPRESSION: No acute intracranial pathology.   Electronically Signed   By: Kathreen Devoid   On: 09/17/2013 18:25   Homer Tube  09/18/2013   CLINICAL DATA:  Cholecystitis.  Cholecystostomy tube in place  EXAM: CHOLANGIOGRAM VIA EXISTING  CATHETER  MEDICATIONS: None.  CONTRAST:  66m OMNIPAQUE IOHEXOL 300 MG/ML  SOLN  FLUOROSCOPY TIME:  24 seconds.  PROCEDURE: Contrast was injected into the cholecystostomy tube and imaging was obtained.  Complications: None.  FINDINGS: Contrast fills the gallbladder, cystic duct, common bile duct, and duodenum compatible with patency. Several gallstones are  seen within the lumen of the gallbladder.  IMPRESSION: Cystic and common bile ducts are patent.  Cholelithiasis.   Electronically Signed   By: AMaryclare BeanM.D.   On: 09/18/2013 17:10   Dg Chest Port 1 View  09/17/2013   CLINICAL DATA:  Sepsis, weakness  EXAM: PORTABLE CHEST - 1 VIEW  COMPARISON:  DG CHEST 1 VIEW dated 07/17/2013; DG CHEST 2 VIEW dated 07/15/2013; DG CHEST 2 VIEW dated 06/21/2012  FINDINGS: The heart size and mediastinal contours are within normal limits. Both lungs are clear. The visualized skeletal structures are unremarkable.  IMPRESSION: No active disease.   Electronically Signed   By: RSkipper ClicheM.D.   On: 09/17/2013 15:35    Medications / Allergies: per chart  Antibiotics: Anti-infectives   Start     Dose/Rate Route Frequency Ordered Stop   09/18/13 0400  vancomycin (VANCOCIN) IVPB 1000 mg/200 mL premix  Status:  Discontinued     1,000 mg 200 mL/hr over 60 Minutes Intravenous Every 12 hours 09/17/13 1557 09/18/13 1324   09/17/13 2330  piperacillin-tazobactam (ZOSYN) IVPB 3.375 g  Status:  Discontinued     3.375 g 12.5 mL/hr over 240 Minutes Intravenous Every 8 hours 09/17/13 1557 09/17/13 2016   09/17/13 2200  ceFEPIme (MAXIPIME) 2 g in dextrose 5 % 50 mL IVPB     2 g 100 mL/hr over 30 Minutes Intravenous Every 12 hours 09/17/13 2016     09/17/13 1515  piperacillin-tazobactam (ZOSYN) IVPB 3.375 g     3.375 g 100 mL/hr over 30 Minutes Intravenous  Once 09/17/13 1505 09/17/13 1625   09/17/13 1515  vancomycin (VANCOCIN) IVPB 1000 mg/200 mL premix  Status:  Discontinued     1,000 mg 200 mL/hr over 60 Minutes Intravenous  Once 09/17/13 1505 09/17/13 1510   09/17/13 1515  vancomycin (VANCOCIN) 2,000 mg in sodium chloride 0.9 % 500 mL IVPB     2,000 mg 250 mL/hr over 120 Minutes Intravenous  Once 09/17/13 1510 09/17/13 1823      Assessment/Plan  Cholecystitis s/p perc chole drain  Bacteremia--GNR per blood cultures  transaminitis -consider GI consult -await  today's labs -plan for a laparoscopic cholecystectomy tomorrow morning -hold plavix -drain injected by IR 3/18, patent -continue with drain care -continue with Maxipime  -IVF  -He has been surgically cleared by cardiology. S/p cardiac cath 3/11   EErby Pian AHammond Henry HospitalSurgery Pager [phone] [phone] 09/19/2013 8:55 AM

## 2013-09-20 NOTE — Preoperative (Signed)
Beta Blockers   Reason not to administer Beta Blockers:Not Applicable 

## 2013-09-20 NOTE — Progress Notes (Signed)
PROGRESS NOTE  [name] [id number] DOB: Dec 20, 1940 DOA: 09/17/2013 PCP: [name], MD  [name] is a 73 y.o. male with multiple comorbidities including coronary artery disease, hypertension, dyslipidemia, morbid obesity, admitted to the surgery service on 07/15/2013, which time he presented with abdominal pain, diagnosed with acute cholecystitis. Given multiple comorbidities, felt to be a high surgical risk for which he underwent percutaneous cholecystostomy drain placement. He was discharged from the surgical service on 07/19/2013. He followed up with cardiology for preoperative clearance. Stress test on her 09/05/2013 revealed reversible ischemia. 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 Cardiology felt that  medical management would be most appropriate, as multiple lesions would not respond well to stenting.   On admission he was noted by his wife to appear acutely ill, lethargic, diaphoretic, having fevers and chills. Patient reported feeling "sick" but could not give more detail. He also reports that he became so weak he was unable to stand on his own. In the ED denies chest pain, shortness of breath, palpitations, diarrhea, dysuria, hematemesis, bright red blood per rectum or melena.     Assessment/Plan:  Sepsis Secondary to ecoli bacteremia Patient now stabilized on IV fluids and antibiotic Acute sepsis resolved.  Ecoli bacteremia 2 of 2 blood cultures are positive for ecoli from 3/17.  Awaiting sensitivities.  Blood bench reports they will be back 09/21/13 Being treated with IV Cefepime.  Will need 14 days of antibiotic therapy (pending sensitivities) Second set of Bld Cx are negative to date.  Will order PICC for 09/21/2013  Cholecystitis with possible ascending cholangitis Patient had a cholecystostomy tube placed on 07/17/2013 as he was considered not appropriate for surgery at that  time. [facility] performing cholecystectomy 3/20. Appreciate GI consultation.  CBD had no stones on cholangiogram 3/18 Will continue with antibiotic therapy.  CAD plavix and aspirin being held in anticipation of surgery 3/20.  Emphysema Currently stable and asymptomatic  HTN Coreg continued.   Imdur being held Hydralazine PRN   HLD   GERD On protonix.      DVT Prophylaxis:  lovenox  Code Status: full Family Communication: wife at bedside. Disposition Plan: home when appropriate.  Will need PT consult after surgery - ordered.   Consultants: [facility]  Procedures:  cholecystectomy  Antibiotics: Anti-infectives   Start     Dose/Rate Route Frequency Ordered Stop   09/20/13 0600  ceFAZolin (ANCEF) IVPB 2 g/50 mL premix     2 g 100 mL/hr over 30 Minutes Intravenous On call to O.R. 09/19/13 1027 09/20/13 0950   09/18/13 0400  vancomycin (VANCOCIN) IVPB 1000 mg/200 mL premix  Status:  Discontinued     1,000 mg 200 mL/hr over 60 Minutes Intravenous Every 12 hours 09/17/13 1557 09/18/13 1324   09/17/13 2330  piperacillin-tazobactam (ZOSYN) IVPB 3.375 g  Status:  Discontinued     3.375 g 12.5 mL/hr over 240 Minutes Intravenous Every 8 hours 09/17/13 1557 09/17/13 2016   09/17/13 2200  [MAR Hold]  ceFEPIme (MAXIPIME) 2 g in dextrose 5 % 50 mL IVPB     (On MAR Hold since 09/20/13 0840)   2 g 100 mL/hr over 30 Minutes Intravenous Every 12 hours 09/17/13 2016     09/17/13 1515  piperacillin-tazobactam (ZOSYN) IVPB 3.375 g     3.375 g 100 mL/hr over 30 Minutes Intravenous  Once 09/17/13 1505 09/17/13 1625  09/17/13 1515  vancomycin (VANCOCIN) IVPB 1000 mg/200 mL premix  Status:  Discontinued     1,000 mg 200 mL/hr over 60 Minutes Intravenous  Once 09/17/13 1505 09/17/13 1510   09/17/13 1515  vancomycin (VANCOCIN) 2,000 mg in sodium chloride 0.9 % 500 mL IVPB     2,000 mg 250 mL/hr over 120 Minutes Intravenous  Once 09/17/13 1510 09/17/13 1823        HPI/Subjective: Anticipating surgery.  Objective: Filed Vitals:   09/19/13 0507 09/19/13 1427 09/19/13 2003 09/20/13 0513  BP: 162/71 173/88 171/79 176/91  Pulse: 98 88 81 82  Temp: 99.5 F (37.5 C) 98.2 F (36.8 C) 97.5 F (36.4 C) 98.6 F (37 C)  TempSrc: Oral Axillary Oral Oral  Resp: 18 18 18 18   Height:      Weight:    82.419 kg (181 lb 11.2 oz)  SpO2: 95% 97% 95% 98%    Intake/Output Summary (Last 24 hours) at 09/20/13 1003 Last data filed at 09/20/13 0514  Gross per 24 hour  Intake 1297.17 ml  Output   1325 ml  Net -27.83 ml   Filed Weights   09/17/13 2012 09/19/13 0500 09/20/13 0513  Weight: 85.4 kg (188 lb 4.4 oz) 83.643 kg (184 lb 6.4 oz) 82.419 kg (181 lb 11.2 oz)    Exam: General: Well developed, well nourished, NAD, lying comfortably in bed. HEENT:  Pupils pin point, Anicteic Sclera, MMM. No pharyngeal erythema or exudates  Neck: Supple, no JVD, no masses  Cardiovascular: RRR, S1 S2 auscultated, no rubs, murmurs or gallops.   Respiratory: Clear to auscultation bilaterally with equal chest rise  Abdomen: Soft, nontender, nondistended, + bowel sounds, +percutaneous cholecystomy drain. Bandage clean and dry. Extremities: warm dry without cyanosis clubbing or edema.  Neuro: AAOx3, cranial nerves grossly intact. Strength 5/5 in upper and lower extremities  Skin: Without rashes exudates or nodules.   Psych: Normal affect and demeanor with intact judgement and insight   Data Reviewed: Basic Metabolic Panel:  Recent Labs Lab 09/17/13 1459 09/17/13 1537 09/18/13 0229 09/19/13 0835 09/19/13 1515 09/20/13 0633  NA 137 138 141 143 138 140  K 3.9 3.7 4.8 4.4 4.2 4.0  CL 96 98 104 102 99 101  CO2 24  --  22 26 26 27   GLUCOSE 126* 128* 128* 98 107* 103*  BUN 16 15 17 21 18 16   CREATININE 0.83 1.00 1.20 1.29 1.10 1.10  CALCIUM 9.5  --  8.3* 8.9 8.7 8.9   Liver Function Tests:  Recent Labs Lab 09/17/13 1459 09/18/13 0229 09/19/13 0835  09/19/13 1515 09/20/13 0633  AST 253* 112* 50* 53* 35  ALT 253* 166* 118* 105* 88*  ALKPHOS 236* 169* 162* 164* 169*  BILITOT 6.0* 5.9* 2.5* 2.2* 1.9*  PROT 7.1 5.7* 6.6 6.4 6.4  ALBUMIN 3.5 2.8* 3.0* 2.9* 2.8*    Recent Labs Lab 09/17/13 1459  LIPASE 19   CBC:  Recent Labs Lab 09/17/13 1459 09/17/13 1537 09/18/13 0229 09/19/13 0835 09/19/13 1515  WBC 10.5  --  11.7* 6.7 7.0  NEUTROABS 10.1*  --   --   --  5.8  HGB 14.2 15.0 11.8* 13.2 12.8*  HCT 41.3 44.0 34.8* 39.5 38.2*  MCV 86.0  --  87.4 88.2 87.4  PLT 113*  --  110* 125* 124*   Cardiac Enzymes:  Recent Labs Lab 09/17/13 1459  TROPONINI <0.30   BNP (last 3 results)  Recent Labs  09/17/13 1459  PROBNP 756.4*  CBG:  Recent Labs Lab 09/17/13 1614  GLUCAP 110*    Recent Results (from the past 240 hour(s))  CULTURE, BLOOD (ROUTINE X 2)     Status: None   Collection Time    09/17/13  3:10 PM      Result Value Ref Range Status   Specimen Description BLOOD ARM RIGHT   Final   Special Requests BOTTLES DRAWN AEROBIC AND ANAEROBIC 5CC   Final   Culture  Setup Time     Final   Value: 09/17/2013 18:52     Performed at [facility]   Culture     Final   Value: ESCHERICHIA COLI     Note: Gram Stain Report Called to,Read Back By and Verified With: [place] USSERY AT 4:41 A.M. ON 09/18/13 WARRB     Performed at [facility]   Report Status PENDING   Incomplete  CULTURE, BLOOD (ROUTINE X 2)     Status: None   Collection Time    09/17/13  3:16 PM      Result Value Ref Range Status   Specimen Description BLOOD RIGHT FOREARM   Final   Special Requests BOTTLES DRAWN AEROBIC AND ANAEROBIC 5CC   Final   Culture  Setup Time     Final   Value: 09/17/2013 18:53     Performed at [facility]   Culture     Final   Value: ESCHERICHIA COLI     Note: Gram Stain Report Called to,Read Back By and Verified With: [place] USSERY 09/18/13 0624A [place]     Performed at [facility]   Report  Status PENDING   Incomplete  URINE CULTURE     Status: None   Collection Time    09/17/13  4:10 PM      Result Value Ref Range Status   Specimen Description URINE, CATHETERIZED   Final   Special Requests NONE   Final   Culture  Setup Time     Final   Value: 09/17/2013 16:58     Performed at [place]     Final   Value: NO GROWTH     Performed at [facility]   Culture     Final   Value: NO GROWTH     Performed at [facility]   Report Status 09/18/2013 FINAL   Final  MRSA PCR SCREENING     Status: None   Collection Time    09/17/13 11:10 PM      Result Value Ref Range Status   MRSA by PCR NEGATIVE  NEGATIVE Final   Comment:            The GeneXpert MRSA Assay (FDA     approved for NASAL specimens     only), is one component of a     comprehensive MRSA colonization     surveillance program. It is not     intended to diagnose MRSA     infection nor to guide or     monitor treatment for     MRSA infections.  CULTURE, BLOOD (ROUTINE X 2)     Status: None   Collection Time    09/19/13  1:25 PM      Result Value Ref Range Status   Specimen Description BLOOD RIGHT ARM   Final   Special Requests BOTTLES DRAWN AEROBIC AND ANAEROBIC 10CC   Final   Culture  Setup Time  Final   Value: 09/19/2013 17:07     Performed at [facility]   Culture     Final   Value:        BLOOD CULTURE RECEIVED NO GROWTH TO DATE CULTURE WILL BE HELD FOR 5 DAYS BEFORE ISSUING A FINAL NEGATIVE REPORT     Performed at [facility]   Report Status PENDING   Incomplete  CULTURE, BLOOD (ROUTINE X 2)     Status: None   Collection Time    09/19/13  1:31 PM      Result Value Ref Range Status   Specimen Description BLOOD RIGHT HAND   Final   Special Requests BOTTLES DRAWN AEROBIC AND ANAEROBIC 10CC   Final   Culture  Setup Time     Final   Value: 09/19/2013 17:07     Performed at [facility]   Culture     Final   Value:        BLOOD  CULTURE RECEIVED NO GROWTH TO DATE CULTURE WILL BE HELD FOR 5 DAYS BEFORE ISSUING A FINAL NEGATIVE REPORT     Performed at [facility]   Report Status PENDING   Incomplete  SURGICAL PCR SCREEN     Status: None   Collection Time    09/20/13 12:44 AM      Result Value Ref Range Status   MRSA, PCR NEGATIVE  NEGATIVE Final   Staphylococcus aureus NEGATIVE  NEGATIVE Final   Comment:            The Xpert SA Assay (FDA     approved for NASAL specimens     in patients over 6 years of age),     is one component of     a comprehensive surveillance     program.  Test performance has     been validated by [employer] for patients greater     than or equal to 5 year old.     It is not intended     to diagnose infection nor to     guide or monitor treatment.     Studies: Ir Cholan Exist Tube  09/18/2013   CLINICAL DATA:  Cholecystitis.  Cholecystostomy tube in place  EXAM: CHOLANGIOGRAM VIA EXISTING CATHETER  MEDICATIONS: None.  CONTRAST:  33mL OMNIPAQUE IOHEXOL 300 MG/ML  SOLN  FLUOROSCOPY TIME:  24 seconds.  PROCEDURE: Contrast was injected into the cholecystostomy tube and imaging was obtained.  Complications: None.  FINDINGS: Contrast fills the gallbladder, cystic duct, common bile duct, and duodenum compatible with patency. Several gallstones are seen within the lumen of the gallbladder.  IMPRESSION: Cystic and common bile ducts are patent.  Cholelithiasis.   Electronically Signed   By: Maryclare Bean M.D.   On: 09/18/2013 17:10    Scheduled Meds: . [facility] HOLD] carvedilol  6.25 mg Oral BID  . [MAR HOLD] ceFEPime (MAXIPIME) IV  2 g Intravenous Q12H  . [MAR HOLD] enoxaparin (LOVENOX) injection  40 mg Subcutaneous Q24H  . [MAR HOLD] pantoprazole  40 mg Oral Daily  . [facility] HOLD] simvastatin  20 mg Oral q1800  . [facility] HOLD] sodium chloride  3 mL Intravenous Q12H   Continuous Infusions: . sodium chloride 50 mL/hr at 09/18/13 1457  . lactated ringers      Principal Problem:    Sepsis, Gram negative Active Problems:   GERD   Fatigue   Cholecystitis   Acute febrile illness   CAD (  coronary artery disease)   Ascending cholangitis    Karen Kitchens  [facility] Pager [phone]. If 7PM-7AM, please contact night-coverage at [email or web address], password [facility] 09/20/2013, 10:03 AM  LOS: 3 days

## 2013-09-20 NOTE — Anesthesia Preprocedure Evaluation (Addendum)
Anesthesia Evaluation  Patient identified by MRN, date of birth, ID band Patient awake    Reviewed: Allergy & Precautions, H&P , NPO status , Patient's Chart, lab work & pertinent test results, reviewed documented beta blocker date and time   Airway Mallampati: I TM Distance: >3 FB     Dental  (+) Edentulous Upper, Edentulous Lower, Dental Advisory Given   Pulmonary shortness of breath and with exertion, pneumonia -, resolved, COPDformer smoker,  breath sounds clear to auscultation        Cardiovascular hypertension, + CAD and + Peripheral Vascular Disease Rhythm:Regular     Neuro/Psych    GI/Hepatic GERD-  Medicated and Controlled,  Endo/Other    Renal/GU      Musculoskeletal   Abdominal (+)  Abdomen: soft. Bowel sounds: normal.  Peds  Hematology  (+) anemia ,   Anesthesia Other Findings Noted AAA, medically managed.  Heart cath on 09/11/13.  Stopped plavix on 09/04/13.  Cardiology consult noted, Dr. Johnsie Cancel.  Waldron Session, CRNA  Reproductive/Obstetrics                      Anesthesia Physical Anesthesia Plan  ASA: IV  Anesthesia Plan:    Post-op Pain Management:    Induction:   Airway Management Planned:   Additional Equipment:   Intra-op Plan:   Post-operative Plan:   Informed Consent:   Plan Discussed with:   Anesthesia Plan Comments:         Anesthesia Quick Evaluation

## 2013-09-20 NOTE — Interval H&P Note (Signed)
History and Physical Interval Note:  09/20/2013 8:41 AM  [name]  has presented today for surgery, with the diagnosis of cholecystitis  The various methods of treatment have been discussed with the patient and family. After consideration of risks, benefits and other options for treatment, the patient has consented to  Procedure(s): LAPAROSCOPIC CHOLECYSTECTOMY WITH INTRAOPERATIVE CHOLANGIOGRAM (N/A) as a surgical intervention .  The patient's history has been reviewed, patient examined, no change in status, stable for surgery.  I have reviewed the patient's chart and labs.  Questions were answered to the patient's satisfaction. The procedure has been discussed with the patient. Operative and non operative treatments have been discussed. Risks of surgery include bleeding, infection,  Common bile duct injury,  Injury to the stomach,liver, colon,small intestine, abdominal wall,  Diaphragm,  Major blood vessels,  And the need for an open procedure.  Other risks include worsening of medical problems, death,  DVT and pulmonary embolism, and cardiovascular events.   Medical options have also been discussed. The patient has been informed of long term expectations of surgery and non surgical options,  The patient agrees to proceed.      Xenia Nile A.

## 2013-09-20 NOTE — Transfer of Care (Signed)
Immediate Anesthesia Transfer of Care Note  Patient: [name]  Procedure(s) Performed: Procedure(s): LAPAROSCOPIC CHOLECYSTECTOMY WITH INTRAOPERATIVE CHOLANGIOGRAM (N/A)  Patient Location: PACU  Anesthesia Type:General  Level of Consciousness: awake, alert  and sedated  Airway & Oxygen Therapy: Patient connected to face mask oxygen  Post-op Assessment: Report given to PACU RN  Post vital signs: stable  Complications: No apparent anesthesia complications

## 2013-09-21 ENCOUNTER — Inpatient Hospital Stay (HOSPITAL_COMMUNITY): Payer: Medicare Other

## 2013-09-21 DIAGNOSIS — M549 Dorsalgia, unspecified: Secondary | ICD-10-CM

## 2013-09-21 DIAGNOSIS — I714 Abdominal aortic aneurysm, without rupture, unspecified: Secondary | ICD-10-CM

## 2013-09-21 DIAGNOSIS — R05 Cough: Secondary | ICD-10-CM

## 2013-09-21 DIAGNOSIS — R059 Cough, unspecified: Secondary | ICD-10-CM

## 2013-09-21 LAB — CULTURE, BLOOD (ROUTINE X 2)

## 2013-09-21 LAB — CBC
HCT: 39.5 % (ref 39.0–52.0)
HEMOGLOBIN: 13.2 g/dL (ref 13.0–17.0)
MCH: 29.5 pg (ref 26.0–34.0)
MCHC: 33.4 g/dL (ref 30.0–36.0)
MCV: 88.2 fL (ref 78.0–100.0)
PLATELETS: 128 10*3/uL — AB (ref 150–400)
RBC: 4.48 MIL/uL (ref 4.22–5.81)
RDW: 14.2 % (ref 11.5–15.5)
WBC: 9.1 10*3/uL (ref 4.0–10.5)

## 2013-09-21 MED ORDER — GUAIFENESIN ER 600 MG PO TB12
1200.0000 mg | ORAL_TABLET | Freq: Two times a day (BID) | ORAL | Status: DC
  Administered 2013-09-21 – 2013-09-23 (×5): 1200 mg via ORAL
  Filled 2013-09-21 (×6): qty 2

## 2013-09-21 MED ORDER — IPRATROPIUM-ALBUTEROL 0.5-2.5 (3) MG/3ML IN SOLN
3.0000 mL | RESPIRATORY_TRACT | Status: DC | PRN
  Administered 2013-09-21: 3 mL via RESPIRATORY_TRACT
  Filled 2013-09-21: qty 3

## 2013-09-21 MED ORDER — PNEUMOCOCCAL VAC POLYVALENT 25 MCG/0.5ML IJ INJ
0.5000 mL | INJECTION | INTRAMUSCULAR | Status: AC
  Administered 2013-09-22: 0.5 mL via INTRAMUSCULAR
  Filled 2013-09-21: qty 0.5

## 2013-09-21 MED ORDER — GUAIFENESIN-DM 100-10 MG/5ML PO SYRP
5.0000 mL | ORAL_SOLUTION | ORAL | Status: DC | PRN
  Filled 2013-09-21: qty 5

## 2013-09-21 NOTE — Evaluation (Signed)
Physical Therapy Evaluation Patient Details Name: [name] MRN: [id number] DOB: Apr 24, 1941 Today's Date: 09/21/2013 Time: 7341-9379 PT Time Calculation (min): 22 min  PT Assessment / Plan / Recommendation History of Present Illness  73 y.o. male admitted to [facility] on 09/17/13 with nausea abdominal pain and weakness. Of significance he was recently being followed by surgery service (with previous admission) for acute cholecystitis with drain placement.  Dx with sepsis and acute enchepholopathy as well as continued cholecystitis.  Pt now is s/p laporoscopic cholecystectomy and has a post-op JP drain.    Clinical Impression  Pt is weak and deconditioned from illness and surgery. He has potential to improve if we can mobilize him multiple days in a row.  His wife is available to help him 24/7 at discharge and they believe that the walker they have at home is one with wheels (she is to check tonight).  Recommending HHPT f/u at discharge as long as he progresses as anticipated.  PT to follow acutely for deficits listed below.       PT Assessment  Patient needs continued PT services    Follow Up Recommendations  Home health PT;Supervision/Assistance - 24 hour    Does the patient have the potential to tolerate intense rehabilitation     NA  Barriers to Discharge   None      Equipment Recommendations  Other (comment);None recommended by PT (pt has RW at home)    Recommendations for Other Services   None  Frequency Min 3X/week    Precautions / Restrictions Precautions Precautions: Fall Precaution Comments: pt weak and deconditioned, monitor vitals with mobility as he gets very DOE.    Pertinent Vitals/Pain HR in the mid 90s, O2 sats on 2 L O2 [place] with mobility in the mid to low 90s. DOE 3/4 with just bed mobility and transfer to the recliner chair.      Mobility  Bed Mobility Overal bed mobility: Needs Assistance Bed Mobility: Supine to Sit Supine to sit: Mod assist General bed  mobility comments: mod assist to support trunk to get to sitting.  Pt pulling with his arms on bed rail and therapist.  Transfers Overall transfer level: Needs assistance Equipment used: 2 person hand held assist Transfers: Sit to/from Stand Sit to Stand: Mod assist;+2 safety/equipment Stand pivot transfers: +2 safety/equipment;Mod assist General transfer comment: two person mod assist for safety due to pt's first time up.  Assist needed to support trunk over weak legs and for balacne.     Exercises General Exercises - Lower Extremity Long Arc Quad: AROM;Both;10 reps;Seated Hip ABduction/ADduction: AROM;Both;10 reps;Seated (adduct only, against pillow for resistance) Hip Flexion/Marching: AROM;Both;10 reps;Seated Toe Raises: AROM;Both;10 reps;Seated Heel Raises: AROM;Both;10 reps;Seated   PT Diagnosis: Difficulty walking;Abnormality of gait;Generalized weakness;Acute pain  PT Problem List: Decreased strength;Decreased activity tolerance;Decreased balance;Decreased mobility;Decreased knowledge of use of DME;Cardiopulmonary status limiting activity;Pain PT Treatment Interventions: DME instruction;Gait training;Stair training;Functional mobility training;Therapeutic activities;Therapeutic exercise;Neuromuscular re-education;Balance training;Patient/family education;Modalities     PT Goals(Current goals can be found in the care plan section) Acute Rehab PT Goals Patient Stated Goal: to go home PT Goal Formulation: With patient/family Time For Goal Achievement: 10/05/13 Potential to Achieve Goals: Good  Visit Information  Last PT Received On: 09/21/13 Assistance Needed: +1 Reason Eval/Treat Not Completed: Patient at procedure or test/unavailable History of Present Illness: 73 y.o. male admitted to [facility] on 09/17/13 with nausea abdominal pain and weakness. Of significance he was recently being followed by surgery service (with previous admission) for acute  cholecystitis with drain placement.   Dx with sepsis and acute enchepholopathy as well as continued cholecystitis.  Pt now is s/p laporoscopic cholecystectomy and has a post-op JP drain.         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 Dominant Hand: Right    Cognition  Cognition Arousal/Alertness: Awake/alert Behavior During Therapy: WFL for tasks assessed/performed Overall Cognitive Status: Within Functional Limits for tasks assessed    Extremity/Trunk Assessment Upper Extremity Assessment Upper Extremity Assessment: Overall WFL for tasks assessed Lower Extremity Assessment Lower Extremity Assessment: Generalized weakness Cervical / Trunk Assessment Cervical / Trunk Assessment: Normal   Balance Balance Overall balance assessment: Needs assistance Sitting-balance support: Bilateral upper extremity supported;Feet supported Sitting balance-Leahy Scale: Poor Standing balance support: Bilateral upper extremity supported Standing balance-Leahy Scale: Poor General Comments General comments (skin integrity, edema, etc.): Pt with increasd DOE with just sitting EOB.  O2 2 L [place] left on entire session.  Pt's DOE 3/4 with just stand pivot to the recliner chair.  VSS throughout despite dyspnea.   End of Session PT - End of Session Equipment Utilized During Treatment: Gait belt Activity Tolerance: Patient limited by fatigue;Patient limited by pain Patient left: in chair;with call bell/phone within reach;with  family/visitor present Nurse Communication: Mobility status    Wells Guiles B. [place], [place], DPT [phone]   09/21/2013, 4:08 PM

## 2013-09-21 NOTE — Progress Notes (Signed)
Patient ID: [name], male   DOB: 1940-08-12, 73 y.o.   MRN: [id number]   Subjective: Passing flatus.  Pulling 750 on IS.  Has not been OOB.  Voiding okay.  Tolerated clears.    Objective:  Vital signs:  Filed Vitals:   09/20/13 1241 09/20/13 1304 09/20/13 2140 09/21/13 0703  BP: 121/44 145/71 164/76 169/77  Pulse:  53 99 81  Temp: 97.7 F (36.5 C) 97.5 F (36.4 C) 97.7 F (36.5 C) 98 F (36.7 C)  TempSrc:  Axillary Oral Oral  Resp:  _0 Height:      Weight:    185 lb 6.4 oz (84.097 kg)  SpO2:  96% 90% 94%    Last BM Date: 09/19/13  Intake/Output   Yesterday:  03/20 0701 - 03/21 0700 In: 1430.3 [P.O.:120; I.V.:1060.3; IV Piggyback:250] Out: 1910 [Urine:1800; Drains:10; Blood:100] This shift:  Total I/O In: 0  Out: 40 [Drains:40]   Physical Exam: General: Pt awake/alert/oriented x3 in no acute distress Abdomen: Soft.  Nondistended. Mildly tender at incisions only. RUQ JP drain with serosanguinous, bile tinged output.  No evidence of peritonitis.  No incarcerated hernias.    Problem List:   Principal Problem:   Sepsis, Gram negative Active Problems:   GERD   Fatigue   Cholecystitis   Acute febrile illness   CAD (coronary artery disease)   Ascending cholangitis    Results:   Labs: Results for orders placed during the hospital encounter of 09/17/13 (from the past 76 hour(s))  CULTURE, BLOOD (ROUTINE X 2)     Status: None   Collection Time    09/19/13  1:25 PM      Result Value Ref Range   Specimen Description BLOOD RIGHT ARM     Special Requests BOTTLES DRAWN AEROBIC AND ANAEROBIC 10CC     Culture  Setup Time       Value: 09/19/2013 17:07     Performed at [facility]   Culture       Value:        BLOOD CULTURE RECEIVED NO GROWTH TO DATE CULTURE WILL BE HELD FOR 5 DAYS BEFORE ISSUING A FINAL NEGATIVE REPORT     Performed at [facility]   Report Status PENDING    CULTURE, BLOOD (ROUTINE X 2)     Status: None    Collection Time    09/19/13  1:31 PM      Result Value Ref Range   Specimen Description BLOOD RIGHT HAND     Special Requests BOTTLES DRAWN AEROBIC AND ANAEROBIC 10CC     Culture  Setup Time       Value: 09/19/2013 17:07     Performed at [facility]   Culture       Value:        BLOOD CULTURE RECEIVED NO GROWTH TO DATE CULTURE WILL BE HELD FOR 5 DAYS BEFORE ISSUING A FINAL NEGATIVE REPORT     Performed at [facility]   Report Status PENDING    CBC WITH DIFFERENTIAL     Status: Abnormal   Collection Time    09/19/13  3:15 PM      Result Value Ref Range   WBC 7.0  4.0 - 10.5 K/uL   RBC 4.37  4.22 - 5.81 MIL/uL   Hemoglobin 12.8 (*) 13.0 - 17.0 g/dL   HCT 38.2 (*) 39.0 - 52.0 %   MCV 87.4  78.0 - 100.0 fL  MCH 29.3  26.0 - 34.0 pg   MCHC 33.5  30.0 - 36.0 g/dL   RDW 14.2  11.5 - 15.5 %   Platelets 124 (*) 150 - 400 K/uL   Neutrophils Relative % 82 (*) 43 - 77 %   Neutro Abs 5.8  1.7 - 7.7 K/uL   Lymphocytes Relative 6 (*) 12 - 46 %   Lymphs Abs 0.4 (*) 0.7 - 4.0 K/uL   Monocytes Relative 11  3 - 12 %   Monocytes Absolute 0.8  0.1 - 1.0 K/uL   Eosinophils Relative 0  0 - 5 %   Eosinophils Absolute 0.0  0.0 - 0.7 K/uL   Basophils Relative 0  0 - 1 %   Basophils Absolute 0.0  0.0 - 0.1 K/uL  COMPREHENSIVE METABOLIC PANEL     Status: Abnormal   Collection Time    09/19/13  3:15 PM      Result Value Ref Range   Sodium 138  137 - 147 mEq/L   Potassium 4.2  3.7 - 5.3 mEq/L   Chloride 99  96 - 112 mEq/L   CO2 26  19 - 32 mEq/L   Glucose, Bld 107 (*) 70 - 99 mg/dL   BUN 18  6 - 23 mg/dL   Creatinine, Ser 1.10  0.50 - 1.35 mg/dL   Calcium 8.7  8.4 - 10.5 mg/dL   Total Protein 6.4  6.0 - 8.3 g/dL   Albumin 2.9 (*) 3.5 - 5.2 g/dL   AST 53 (*) 0 - 37 U/L   ALT 105 (*) 0 - 53 U/L   Alkaline Phosphatase 164 (*) 39 - 117 U/L   Total Bilirubin 2.2 (*) 0.3 - 1.2 mg/dL   GFR calc non Af Amer 64 (*) >90 mL/min   GFR calc Af Amer 74 (*) >90 mL/min   Comment: (NOTE)      The eGFR has been calculated using the CKD EPI equation.     This calculation has not been validated in all clinical situations.     eGFR's persistently <90 mL/min signify possible Chronic Kidney     Disease.  SURGICAL PCR SCREEN     Status: None   Collection Time    09/20/13 12:44 AM      Result Value Ref Range   MRSA, PCR NEGATIVE  NEGATIVE   Staphylococcus aureus NEGATIVE  NEGATIVE   Comment:            The Xpert SA Assay (FDA     approved for NASAL specimens     in patients over 58 years of age),     is one component of     a comprehensive surveillance     program.  Test performance has     been validated by [employer] for patients greater     than or equal to 29 year old.     It is not intended     to diagnose infection nor to     guide or monitor treatment.  COMPREHENSIVE METABOLIC PANEL     Status: Abnormal   Collection Time    09/20/13  6:33 AM      Result Value Ref Range   Sodium 140  137 - 147 mEq/L   Potassium 4.0  3.7 - 5.3 mEq/L   Chloride 101  96 - 112 mEq/L   CO2 27  19 - 32 mEq/L   Glucose, Bld 103 (*) 70 - 99 mg/dL  BUN 16  6 - 23 mg/dL   Creatinine, Ser 1.10  0.50 - 1.35 mg/dL   Calcium 8.9  8.4 - 10.5 mg/dL   Total Protein 6.4  6.0 - 8.3 g/dL   Albumin 2.8 (*) 3.5 - 5.2 g/dL   AST 35  0 - 37 U/L   ALT 88 (*) 0 - 53 U/L   Alkaline Phosphatase 169 (*) 39 - 117 U/L   Total Bilirubin 1.9 (*) 0.3 - 1.2 mg/dL   GFR calc non Af Amer 64 (*) >90 mL/min   GFR calc Af Amer 74 (*) >90 mL/min   Comment: (NOTE)     The eGFR has been calculated using the CKD EPI equation.     This calculation has not been validated in all clinical situations.     eGFR's persistently <90 mL/min signify possible Chronic Kidney     Disease.  CBC     Status: Abnormal   Collection Time    09/21/13  5:00 AM      Result Value Ref Range   WBC 9.1  4.0 - 10.5 K/uL   RBC 4.48  4.22 - 5.81 MIL/uL   Hemoglobin 13.2  13.0 - 17.0 g/dL   HCT 39.5  39.0 - 52.0 %   MCV 88.2  78.0  - 100.0 fL   MCH 29.5  26.0 - 34.0 pg   MCHC 33.4  30.0 - 36.0 g/dL   RDW 14.2  11.5 - 15.5 %   Platelets 128 (*) 150 - 400 K/uL    Imaging / Studies: Dg Cholangiogram Operative  09/20/2013   CLINICAL DATA:  Cholelithiasis  EXAM: INTRAOPERATIVE CHOLANGIOGRAM  FLUOROSCOPY TIME:  8 seconds  COMPARISON:  IR CHOLAN EXIST TUBE dated 09/18/2013; IR PERC CHOLECYSTOSTOMY dated 07/17/2013; CT ABD/PELV WO CM dated 09/17/2013  FINDINGS: Intraoperative angiographic images of the right upper abdominal quadrant during laparoscopic cholecystectomy are provided for review.  Surgical clips overlie the expected location of the gallbladder fossa.  Contrast injection demonstrates selective cannulation of the central aspect of the cystic duct.  There is passage of contrast through the central aspect of the cystic duct with filling of a non dilated common bile duct. There is passage of contrast though the CBD and into the descending portion of the duodenum.  There is minimal reflux of injected contrast into the common hepatic duct and central aspect of the non dilated intrahepatic biliary system.  There are no discrete filling defects within the opacified portions of the biliary system to suggest the presence of choledocholithiasis.  Enteric tube tip and side port overlie the expected location of the gastric antrum.  IMPRESSION: No evidence of choledocholithiasis.   Electronically Signed   By: Sandi Mariscal M.D.   On: 09/20/2013 11:30    Medications / Allergies: per chart  Antibiotics: Anti-infectives   Start     Dose/Rate Route Frequency Ordered Stop   09/20/13 0600  ceFAZolin (ANCEF) IVPB 2 g/50 mL premix     2 g 100 mL/hr over 30 Minutes Intravenous On call to O.R. 09/19/13 1027 09/20/13 0950   09/18/13 0400  vancomycin (VANCOCIN) IVPB 1000 mg/200 mL premix  Status:  Discontinued     1,000 mg 200 mL/hr over 60 Minutes Intravenous Every 12 hours 09/17/13 1557 09/18/13 1324   09/17/13 2330  piperacillin-tazobactam  (ZOSYN) IVPB 3.375 g  Status:  Discontinued     3.375 g 12.5 mL/hr over 240 Minutes Intravenous Every 8 hours 09/17/13 1557 09/17/13 2016   09/17/13  2200  ceFEPIme (MAXIPIME) 2 g in dextrose 5 % 50 mL IVPB     2 g 100 mL/hr over 30 Minutes Intravenous Every 12 hours 09/17/13 2016     09/17/13 1515  piperacillin-tazobactam (ZOSYN) IVPB 3.375 g     3.375 g 100 mL/hr over 30 Minutes Intravenous  Once 09/17/13 1505 09/17/13 1625   09/17/13 1515  vancomycin (VANCOCIN) IVPB 1000 mg/200 mL premix  Status:  Discontinued     1,000 mg 200 mL/hr over 60 Minutes Intravenous  Once 09/17/13 1505 09/17/13 1510   09/17/13 1515  vancomycin (VANCOCIN) 2,000 mg in sodium chloride 0.9 % 500 mL IVPB     2,000 mg 250 mL/hr over 120 Minutes Intravenous  Once 09/17/13 1510 09/17/13 1823      Assessment/Plan  Cholecystitis s/p perc chole drain s/p lap chole with IOC---Dr. Brantley Stage 3/20 Bacteremia--e coli per blood cultures, repeat [facility] 3/19 negative to date transaminitis  -advance to full liquid diet -Mobilize!! -IS/flutter valve  -LFTs are trending down, likely passed a stone, IOC negative -resume plavix  -continue drain care--appears bilious ?HIDA scan -continue with Maxipime   Erby Pian, [facility] Pager [phone] Office [phone]  09/21/2013 11:35 AM

## 2013-09-21 NOTE — Progress Notes (Signed)
Gen. Surgery:  I have interviewed and examined this patient this morning. I have discussed his treatment plan with the family. I agree with the assessment and treatment plan outlined by Ms. Reba, NP.  LFTs are slowly trending down. The abdomen is reasonably soft. JP drain has a slight greenish tinge to it, although not clearly a bile leak. Will hold off on biliary scan for the moment. Advanced diet and activities as tolerated Lab work Architectural technologist.  [name]. Dalbert Batman, M.D., [facility] General and Minimally invasive Surgery Breast and Colorectal Surgery Office:   [phone] Pager:   [phone]

## 2013-09-21 NOTE — Progress Notes (Signed)
PROGRESS NOTE  [name] [id number] DOB: Nov 22, 1940 DOA: 09/17/2013 PCP: [name], MD  [name] is a 73 y.o. male with multiple comorbidities including coronary artery disease, hypertension, dyslipidemia, morbid obesity, admitted to the surgery service on 07/15/2013, which time he presented with abdominal pain, diagnosed with acute cholecystitis. Given multiple comorbidities, felt to be a high surgical risk for which he underwent percutaneous cholecystostomy drain placement. He was discharged from the surgical service on 07/19/2013. He followed up with cardiology for preoperative clearance. Stress test on her 09/05/2013 revealed reversible ischemia. 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 Cardiology felt that  medical management would be most appropriate, as multiple lesions would not respond well to stenting.  On admission he was noted by his wife to appear acutely ill, lethargic, diaphoretic, having fevers and chills. Patient reported feeling "sick" but could not give more detail. He also reports that he became so weak he was unable to stand on his own. In the ED denies chest pain, shortness of breath, palpitations, diarrhea, dysuria, hematemesis, bright red blood per rectum or melena.  Subjective: Patient has some chills, also complaining about cough and sputum production. I will obtain chest x-ray.  Assessment/Plan:  Sepsis Secondary to E-coli bacteremia Patient now stabilized on IV fluids and antibiotic Acute sepsis resolved.  Ecoli bacteremia 2 of 2 blood cultures are positive for ecoli from 3/17.   Being treated with IV Cefepime.  Will need 14 days of antibiotic therapy, likely can be discharged on oral Cipro. Second set of Bld Cx are negative to date.    Cholecystitis with possible ascending cholangitis Patient had a cholecystostomy tube placed on 07/17/2013 as he was considered not  appropriate for surgery at that time. Patient undergone cholecystectomy 3/20,  drain in place. Appreciate GI consultation.  CBD had no stones on cholangiogram 3/18 Will continue with antibiotic therapy.  CAD Restart aspirin and Plavix when okay with general surgery.  Emphysema Currently stable and asymptomatic  HTN Coreg continued.   Imdur being held Hydralazine PRN  HLD  GERD On protonix.      DVT Prophylaxis:  lovenox  Code Status: full Family Communication: wife at bedside. Disposition Plan: home when appropriate.  Will need PT consult after surgery - ordered.   Consultants: [facility]  Procedures:  cholecystectomy  Antibiotics: Anti-infectives   Start     Dose/Rate Route Frequency Ordered Stop   09/20/13 0600  ceFAZolin (ANCEF) IVPB 2 g/50 mL premix     2 g 100 mL/hr over 30 Minutes Intravenous On call to O.R. 09/19/13 1027 09/20/13 0950   09/18/13 0400  vancomycin (VANCOCIN) IVPB 1000 mg/200 mL premix  Status:  Discontinued     1,000 mg 200 mL/hr over 60 Minutes Intravenous Every 12 hours 09/17/13 1557 09/18/13 1324   09/17/13 2330  piperacillin-tazobactam (ZOSYN) IVPB 3.375 g  Status:  Discontinued     3.375 g 12.5 mL/hr over 240 Minutes Intravenous Every 8 hours 09/17/13 1557 09/17/13 2016   09/17/13 2200  ceFEPIme (MAXIPIME) 2 g in dextrose 5 % 50 mL IVPB     2 g 100 mL/hr over 30 Minutes Intravenous Every 12 hours 09/17/13 2016     09/17/13 1515  piperacillin-tazobactam (ZOSYN) IVPB 3.375 g     3.375 g 100 mL/hr over 30 Minutes Intravenous  Once 09/17/13 1505 09/17/13 1625   09/17/13 1515  vancomycin (VANCOCIN)  IVPB 1000 mg/200 mL premix  Status:  Discontinued     1,000 mg 200 mL/hr over 60 Minutes Intravenous  Once 09/17/13 1505 09/17/13 1510   09/17/13 1515  vancomycin (VANCOCIN) 2,000 mg in sodium chloride 0.9 % 500 mL IVPB     2,000 mg 250 mL/hr over 120 Minutes Intravenous  Once 09/17/13 1510 09/17/13 1823        HPI/Subjective: Anticipating surgery.  Objective: Filed Vitals:   09/20/13 1241 09/20/13 1304 09/20/13 2140 09/21/13 0703  BP: 121/44 145/71 164/76 169/77  Pulse:  53 99 81  Temp: 97.7 F (36.5 C) 97.5 F (36.4 C) 97.7 F (36.5 C) 98 F (36.7 C)  TempSrc:  Axillary Oral Oral  Resp:  16 18 18   Height:      Weight:    84.097 kg (185 lb 6.4 oz)  SpO2:  96% 90% 94%    Intake/Output Summary (Last 24 hours) at 09/21/13 1129 Last data filed at 09/21/13 0948  Gross per 24 hour  Intake 380.33 ml  Output   1150 ml  Net -769.67 ml   Filed Weights   09/19/13 0500 09/20/13 0513 09/21/13 0703  Weight: 83.643 kg (184 lb 6.4 oz) 82.419 kg (181 lb 11.2 oz) 84.097 kg (185 lb 6.4 oz)    Exam: General: Well developed, well nourished, NAD, lying comfortably in bed. HEENT:  Pupils pin point, Anicteic Sclera, MMM. No pharyngeal erythema or exudates  Neck: Supple, no JVD, no masses  Cardiovascular: RRR, S1 S2 auscultated, no rubs, murmurs or gallops.   Respiratory: Clear to auscultation bilaterally with equal chest rise  Abdomen: Soft, nontender, nondistended, + bowel sounds, +percutaneous cholecystomy drain. Bandage clean and dry. Extremities: warm dry without cyanosis clubbing or edema.  Neuro: AAOx3, cranial nerves grossly intact. Strength 5/5 in upper and lower extremities  Skin: Without rashes exudates or nodules.   Psych: Normal affect and demeanor with intact judgement and insight   Data Reviewed: Basic Metabolic Panel:  Recent Labs Lab 09/17/13 1459 09/17/13 1537 09/18/13 0229 09/19/13 0835 09/19/13 1515 09/20/13 0633  NA 137 138 141 143 138 140  K 3.9 3.7 4.8 4.4 4.2 4.0  CL 96 98 104 102 99 101  CO2 24  --  22 26 26 27   GLUCOSE 126* 128* 128* 98 107* 103*  BUN 16 15 17 21 18 16   CREATININE 0.83 1.00 1.20 1.29 1.10 1.10  CALCIUM 9.5  --  8.3* 8.9 8.7 8.9   Liver Function Tests:  Recent Labs Lab 09/17/13 1459 09/18/13 0229 09/19/13 0835 09/19/13 1515  09/20/13 0633  AST 253* 112* 50* 53* 35  ALT 253* 166* 118* 105* 88*  ALKPHOS 236* 169* 162* 164* 169*  BILITOT 6.0* 5.9* 2.5* 2.2* 1.9*  PROT 7.1 5.7* 6.6 6.4 6.4  ALBUMIN 3.5 2.8* 3.0* 2.9* 2.8*    Recent Labs Lab 09/17/13 1459  LIPASE 19   CBC:  Recent Labs Lab 09/17/13 1459 09/17/13 1537 09/18/13 0229 09/19/13 0835 09/19/13 1515 09/21/13 0500  WBC 10.5  --  11.7* 6.7 7.0 9.1  NEUTROABS 10.1*  --   --   --  5.8  --   HGB 14.2 15.0 11.8* 13.2 12.8* 13.2  HCT 41.3 44.0 34.8* 39.5 38.2* 39.5  MCV 86.0  --  87.4 88.2 87.4 88.2  PLT 113*  --  110* 125* 124* 128*   Cardiac Enzymes:  Recent Labs Lab 09/17/13 1459  TROPONINI <0.30   BNP (last 3 results)  Recent Labs  09/17/13  [place]   CBG:  Recent Labs Lab 09/17/13 1614  GLUCAP 110*    Recent Results (from the past 240 hour(s))  CULTURE, BLOOD (ROUTINE X 2)     Status: None   Collection Time    09/17/13  3:10 PM      Result Value Ref Range Status   Specimen Description BLOOD ARM RIGHT   Final   Special Requests BOTTLES DRAWN AEROBIC AND ANAEROBIC 5CC   Final   Culture  Setup Time     Final   Value: 09/17/2013 18:52     Performed at [facility]   Culture     Final   Value: ESCHERICHIA COLI     Note: Gram Stain Report Called to,Read Back By and Verified With: [place] USSERY AT 4:41 A.M. ON 09/18/13 WARRB     Performed at [facility]   Report Status 09/21/2013 FINAL   Final   Organism ID, Bacteria ESCHERICHIA COLI   Final  CULTURE, BLOOD (ROUTINE X 2)     Status: None   Collection Time    09/17/13  3:16 PM      Result Value Ref Range Status   Specimen Description BLOOD RIGHT FOREARM   Final   Special Requests BOTTLES DRAWN AEROBIC AND ANAEROBIC 5CC   Final   Culture  Setup Time     Final   Value: 09/17/2013 18:53     Performed at [facility]   Culture     Final   Value: ESCHERICHIA COLI     Note: SUSCEPTIBILITIES PERFORMED ON PREVIOUS CULTURE WITHIN THE LAST  5 DAYS.     Note: Gram Stain Report Called to,Read Back By and Verified With: [place] USSERY 09/18/13 0624A [facility]     Performed at [facility]   Report Status 09/21/2013 FINAL   Final  URINE CULTURE     Status: None   Collection Time    09/17/13  4:10 PM      Result Value Ref Range Status   Specimen Description URINE, CATHETERIZED   Final   Special Requests NONE   Final   Culture  Setup Time     Final   Value: 09/17/2013 16:58     Performed at [facility]     Final   Value: NO GROWTH     Performed at [facility]   Culture     Final   Value: NO GROWTH     Performed at [facility]   Report Status 09/18/2013 FINAL   Final  MRSA PCR SCREENING     Status: None   Collection Time    09/17/13 11:10 PM      Result Value Ref Range Status   MRSA by PCR NEGATIVE  NEGATIVE Final   Comment:            The GeneXpert MRSA Assay (FDA     approved for NASAL specimens     only), is one component of a     comprehensive MRSA colonization     surveillance program. It is not     intended to diagnose MRSA     infection nor to guide or     monitor treatment for     MRSA infections.  CULTURE, BLOOD (ROUTINE X 2)     Status: None   Collection Time    09/19/13  1:25 PM      Result Value Ref Range Status  Specimen Description BLOOD RIGHT ARM   Final   Special Requests BOTTLES DRAWN AEROBIC AND ANAEROBIC 10CC   Final   Culture  Setup Time     Final   Value: 09/19/2013 17:07     Performed at [facility]   Culture     Final   Value:        BLOOD CULTURE RECEIVED NO GROWTH TO DATE CULTURE WILL BE HELD FOR 5 DAYS BEFORE ISSUING A FINAL NEGATIVE REPORT     Performed at [facility]   Report Status PENDING   Incomplete  CULTURE, BLOOD (ROUTINE X 2)     Status: None   Collection Time    09/19/13  1:31 PM      Result Value Ref Range Status   Specimen Description BLOOD RIGHT HAND   Final   Special Requests BOTTLES DRAWN AEROBIC AND  ANAEROBIC 10CC   Final   Culture  Setup Time     Final   Value: 09/19/2013 17:07     Performed at [facility]   Culture     Final   Value:        BLOOD CULTURE RECEIVED NO GROWTH TO DATE CULTURE WILL BE HELD FOR 5 DAYS BEFORE ISSUING A FINAL NEGATIVE REPORT     Performed at [facility]   Report Status PENDING   Incomplete  SURGICAL PCR SCREEN     Status: None   Collection Time    09/20/13 12:44 AM      Result Value Ref Range Status   MRSA, PCR NEGATIVE  NEGATIVE Final   Staphylococcus aureus NEGATIVE  NEGATIVE Final   Comment:            The Xpert SA Assay (FDA     approved for NASAL specimens     in patients over 55 years of age),     is one component of     a comprehensive surveillance     program.  Test performance has     been validated by [employer] for patients greater     than or equal to 94 year old.     It is not intended     to diagnose infection nor to     guide or monitor treatment.     Studies: Dg Cholangiogram Operative  09/20/2013   CLINICAL DATA:  Cholelithiasis  EXAM: INTRAOPERATIVE CHOLANGIOGRAM  FLUOROSCOPY TIME:  8 seconds  COMPARISON:  IR CHOLAN EXIST TUBE dated 09/18/2013; IR PERC CHOLECYSTOSTOMY dated 07/17/2013; CT ABD/PELV WO CM dated 09/17/2013  FINDINGS: Intraoperative angiographic images of the right upper abdominal quadrant during laparoscopic cholecystectomy are provided for review.  Surgical clips overlie the expected location of the gallbladder fossa.  Contrast injection demonstrates selective cannulation of the central aspect of the cystic duct.  There is passage of contrast through the central aspect of the cystic duct with filling of a non dilated common bile duct. There is passage of contrast though the CBD and into the descending portion of the duodenum.  There is minimal reflux of injected contrast into the common hepatic duct and central aspect of the non dilated intrahepatic biliary system.  There are no discrete filling  defects within the opacified portions of the biliary system to suggest the presence of choledocholithiasis.  Enteric tube tip and side port overlie the expected location of the gastric antrum.  IMPRESSION: No evidence of choledocholithiasis.   Electronically Signed   By: Jenny Reichmann  Watts M.D.   On: 09/20/2013 11:30    Scheduled Meds: . carvedilol  6.25 mg Oral BID  . ceFEPime (MAXIPIME) IV  2 g Intravenous Q12H  . enoxaparin (LOVENOX) injection  40 mg Subcutaneous Q24H  . pantoprazole  40 mg Oral Daily  . [START ON 09/22/2013] pneumococcal 23 valent vaccine  0.5 mL Intramuscular Tomorrow-1000  . simvastatin  20 mg Oral q1800  . sodium chloride  3 mL Intravenous Q12H   Continuous Infusions: . sodium chloride 20 mL/hr at 09/20/13 2000    Principal Problem:   Sepsis, Gram negative Active Problems:   GERD   Fatigue   Cholecystitis   Acute febrile illness   CAD (coronary artery disease)   Ascending cholangitis    [facility]  [facility] Pager [phone]. If 7PM-7AM, please contact night-coverage at [email or web address], password [facility] 09/21/2013, 11:29 AM  LOS: 4 days

## 2013-09-21 NOTE — Progress Notes (Signed)
PT Cancellation Note  Patient Details Name: [name] MRN: [id number] DOB: Nov 23, 1940   Cancelled Treatment:    Reason Eval/Treat Not Completed: Patient at procedure or test/unavailable.  Pt is not in his room.  PT to check back later today or tomorrow as time allows.    Thanks,    Barbarann Ehlers. [place], [place], DPT [phone]   09/21/2013, 2:44 PM

## 2013-09-22 DIAGNOSIS — I70219 Atherosclerosis of native arteries of extremities with intermittent claudication, unspecified extremity: Secondary | ICD-10-CM

## 2013-09-22 LAB — CBC WITH DIFFERENTIAL/PLATELET
Basophils Absolute: 0 10*3/uL (ref 0.0–0.1)
Basophils Relative: 0 % (ref 0–1)
EOS PCT: 2 % (ref 0–5)
Eosinophils Absolute: 0.1 10*3/uL (ref 0.0–0.7)
HEMATOCRIT: 38.1 % — AB (ref 39.0–52.0)
HEMOGLOBIN: 12.7 g/dL — AB (ref 13.0–17.0)
LYMPHS ABS: 0.8 10*3/uL (ref 0.7–4.0)
Lymphocytes Relative: 11 % — ABNORMAL LOW (ref 12–46)
MCH: 29.4 pg (ref 26.0–34.0)
MCHC: 33.3 g/dL (ref 30.0–36.0)
MCV: 88.2 fL (ref 78.0–100.0)
MONOS PCT: 11 % (ref 3–12)
Monocytes Absolute: 0.8 10*3/uL (ref 0.1–1.0)
Neutro Abs: 5.5 10*3/uL (ref 1.7–7.7)
Neutrophils Relative %: 76 % (ref 43–77)
Platelets: 125 10*3/uL — ABNORMAL LOW (ref 150–400)
RBC: 4.32 MIL/uL (ref 4.22–5.81)
RDW: 14.1 % (ref 11.5–15.5)
WBC: 7.3 10*3/uL (ref 4.0–10.5)

## 2013-09-22 LAB — COMPREHENSIVE METABOLIC PANEL
ALBUMIN: 2.6 g/dL — AB (ref 3.5–5.2)
ALK PHOS: 137 U/L — AB (ref 39–117)
ALT: 47 U/L (ref 0–53)
AST: 37 U/L (ref 0–37)
BUN: 21 mg/dL (ref 6–23)
CO2: 23 mEq/L (ref 19–32)
Calcium: 8.8 mg/dL (ref 8.4–10.5)
Chloride: 101 mEq/L (ref 96–112)
Creatinine, Ser: 0.83 mg/dL (ref 0.50–1.35)
GFR calc non Af Amer: 85 mL/min — ABNORMAL LOW (ref >90)
GLUCOSE: 106 mg/dL — AB (ref 70–99)
POTASSIUM: 4.6 meq/L (ref 3.7–5.3)
Sodium: 139 mEq/L (ref 137–147)
TOTAL PROTEIN: 6.3 g/dL (ref 6.0–8.3)
Total Bilirubin: 1.8 mg/dL — ABNORMAL HIGH (ref 0.3–1.2)

## 2013-09-22 LAB — LIPASE, BLOOD: Lipase: 23 U/L (ref 11–59)

## 2013-09-22 MED ORDER — POLYETHYLENE GLYCOL 3350 17 G PO PACK
17.0000 g | PACK | Freq: Every day | ORAL | Status: DC
  Administered 2013-09-22 – 2013-09-23 (×2): 17 g via ORAL
  Filled 2013-09-22 (×2): qty 1

## 2013-09-22 MED ORDER — CLOPIDOGREL BISULFATE 75 MG PO TABS
75.0000 mg | ORAL_TABLET | Freq: Every evening | ORAL | Status: DC
  Administered 2013-09-22: 75 mg via ORAL
  Filled 2013-09-22 (×2): qty 1

## 2013-09-22 MED ORDER — ASPIRIN 81 MG PO CHEW
81.0000 mg | CHEWABLE_TABLET | Freq: Every day | ORAL | Status: DC
  Administered 2013-09-22 – 2013-09-23 (×2): 81 mg via ORAL
  Filled 2013-09-22 (×2): qty 1

## 2013-09-22 NOTE — Progress Notes (Signed)
Physical Therapy Treatment Patient Details Name: [name] MRN: [id number] DOB: March 31, 1941 Today's Date: 09/22/2013 Time: 7858-8502 PT Time Calculation (min): 23 min  PT Assessment / Plan / Recommendation  History of Present Illness 73 y.o. male admitted to [facility] on 09/17/13 with nausea abdominal pain and weakness. Of significance he was recently being followed by surgery service (with previous admission) for acute cholecystitis with drain placement.  Dx with sepsis and acute enchepholopathy as well as continued cholecystitis.  Pt now is s/p laporoscopic cholecystectomy and has a post-op JP drain.     PT Comments   Pt is progressing well with his mobility and despite 2/4 DOE with gait, his O2 sats and HR remained stable on RA.  We reviewed the HEP given yesterday and encouraged him to do the exercises while he is here in the hospital.  I also encouraged him to go on another walk with the staff later today.  He will need to practice stairs prior to d/c.  Follow Up Recommendations  Home health PT;Supervision/Assistance - 24 hour     Does the patient have the potential to tolerate intense rehabilitation   NA  Barriers to Discharge  None      Equipment Recommendations  None recommended by PT (pt has RW at home)    Recommendations for Other Services   None  Frequency Min 3X/week   Progress towards PT Goals Progress towards PT goals: Progressing toward goals  Plan Current plan remains appropriate    Precautions / Restrictions Precautions Precautions: Fall Precaution Comments: pt weak and deconditioned, monitor vitals with mobility as he gets very DOE and lightheaded in standing.    Pertinent Vitals/Pain  09/22/13 1126  Vital Signs  Pulse Rate 85  Pain Assessment  Pain Assessment 0-10  Pain Score 2  Pain Type Acute pain  Pain Location Abdomen  Pain Intervention(s) Repositioned;Ambulation/increased activity  Oxygen Therapy  SpO2 96 % (supine at rest)  O2 Device None (Room air)     09/22/13 1130  Vital Signs  Pulse Rate ! 103  Oxygen Therapy  SpO2 93 % (seated EOB DOE 2/4)  O2 Device None (Room air)    09/22/13 1135  Vital Signs  Pulse Rate 96  Oxygen Therapy  SpO2 94 % (DOE 2/4, after walking to door and back)  O2 Device None (Room air)    09/22/13 1157  Vital Signs  Pulse Rate 98  Oxygen Therapy  SpO2 95 % (DOE 2/4 after walking to RN station and back, left on RA)  O2 Device None (Room air)      Mobility  Bed Mobility Overal bed mobility: Needs Assistance Bed Mobility: Rolling;Sidelying to Sit Rolling: Modified independent (Device/Increase time) (with bed rail) Sidelying to sit: Min guard General bed mobility comments: Min guard assist to support trunk during last half of transition for balance.  Encouragement to breathe as he moved.   Transfers Overall transfer level: Needs assistance Equipment used: Rolling walker (2 wheeled) Transfers: Sit to/from Stand Sit to Stand: Min assist General transfer comment: Min assist x 2 (better the second time) from bed.  Verbal cues for safe hand placement during transitions.  Controlled descent to recliner chair using arms.  Ambulation/Gait Ambulation/Gait assistance: Min guard Ambulation Distance (Feet): 100 Feet (20' x1 with 3 min seated rest break, 80' x 1) Assistive device: Rolling walker (2 wheeled) Gait Pattern/deviations: Step-through pattern;Shuffle;Trunk flexed Gait velocity: decreased Gait velocity interpretation: Below normal speed for age/gender General Gait Details: Verbal cues for upright posture,  safe RW use.  Pt with slow gait pattern.  Advised him to stand a minute every time he got to his feet to make sure that the light headedness he is feeling is getting better and not worse.  Advised this for initial sitting EOB as well.      Exercises General Exercises - Lower Extremity Long Arc Quad: AROM;Both;10 reps;Seated Hip ABduction/ADduction: AROM;Both;10 reps;Seated (adduct only, against  pillow for resistance) Hip Flexion/Marching: AROM;Both;10 reps;Seated Toe Raises: AROM;Both;10 reps;Seated Heel Raises: AROM;Both;10 reps;Seated     PT Goals (current goals can now be found in the care plan section) Acute Rehab PT Goals Patient Stated Goal: to go home  Visit Information  Last PT Received On: 09/22/13 Assistance Needed: +1 History of Present Illness: 73 y.o. male admitted to [facility] on 09/17/13 with nausea abdominal pain and weakness. Of significance he was recently being followed by surgery service (with previous admission) for acute cholecystitis with drain placement.  Dx with sepsis and acute enchepholopathy as well as continued cholecystitis.  Pt now is s/p laporoscopic cholecystectomy and has a post-op JP drain.      Subjective Data  Subjective: Pt is wanting to get up and walk and stated he was able to sit up a little over an hour in the chair yesterday.  Patient Stated Goal: to go home   Cognition  Cognition Arousal/Alertness: Awake/alert Behavior During Therapy: WFL for tasks assessed/performed Overall Cognitive Status: Within Functional Limits for tasks assessed (not specifically tested)    Balance  Balance Overall balance assessment: Needs assistance Sitting-balance support: Feet supported Sitting balance-Leahy Scale: Good Standing balance support: Bilateral upper extremity supported Standing balance-Leahy Scale: Poor General Comments General comments (skin integrity, edema, etc.): Pt on RA throughout session DOE 2/4 with gait and mobility, but O2 sats and HR remained stable on RA.  RN made aware and pt left on RA after walking.    End of Session PT - End of Session Equipment Utilized During Treatment: Gait belt Activity Tolerance: Patient limited by fatigue;Treatment limited secondary to medical complications (Comment) (limited by DOE) Patient left: in chair;with call bell/phone within reach;with family/visitor present Nurse Communication: Mobility  status;Other (comment) (left pt on RA due to good O2 sats)     Wells Guiles B. [place], [place], DPT [phone]   09/22/2013, 12:04 PM

## 2013-09-22 NOTE — Progress Notes (Signed)
Patient ID: [name], male   DOB: 19-Feb-1941, 73 y.o.   MRN: [id number]  Subjective: Sat up in a chair, c/o gas pain.  Discussed mobilization.  PT ordered.  Passing flatus.  Tolerating full liquid diet.  Objective:  Vital signs:  Filed Vitals:   09/21/13 1827 09/21/13 2056 09/22/13 0500 09/22/13 0545  BP: 187/92 165/82  171/80  Pulse: 86 82  75  Temp:  97.9 F (36.6 C)  97.7 F (36.5 C)  TempSrc:  Oral  Oral  Resp:  18  18  Height:      Weight:   190 lb 14.4 oz (86.592 kg)   SpO2:  95%  94%    Last BM Date: 09/19/13  Intake/Output   Yesterday:  03/21 0701 - 03/22 0700 In: 443 [I.V.:443] Out: 5397 [Urine:1150; Drains:105] This shift:    I/O last 3 completed shifts: In: 30 [P.O.:120; I.V.:483] Out: 2365 [Urine:2250; Drains:115]    Physical Exam:  General: Pt awake/alert/oriented x3 in no acute distress  Abdomen: [email or web address] distended. Mildly tender at incisions only. RUQ JP drain with serosanguinous output. No evidence of peritonitis. No incarcerated hernias   Problem List:   Principal Problem:   Sepsis, Gram negative Active Problems:   GERD   Fatigue   Cholecystitis   Acute febrile illness   CAD (coronary artery disease)   Ascending cholangitis    Results:   Labs: Results for orders placed during the hospital encounter of 09/17/13 (from the past 37 hour(s))  CBC     Status: Abnormal   Collection Time    09/21/13  5:00 AM      Result Value Ref Range   WBC 9.1  4.0 - 10.5 K/uL   RBC 4.48  4.22 - 5.81 MIL/uL   Hemoglobin 13.2  13.0 - 17.0 g/dL   HCT 39.5  39.0 - 52.0 %   MCV 88.2  78.0 - 100.0 fL   MCH 29.5  26.0 - 34.0 pg   MCHC 33.4  30.0 - 36.0 g/dL   RDW 14.2  11.5 - 15.5 %   Platelets 128 (*) 150 - 400 K/uL  COMPREHENSIVE METABOLIC PANEL     Status: Abnormal   Collection Time    09/22/13  6:40 AM      Result Value Ref Range   Sodium 139  137 - 147 mEq/L   Potassium 4.6  3.7 - 5.3 mEq/L   Comment: HEMOLYSIS AT THIS LEVEL MAY AFFECT  RESULT   Chloride 101  96 - 112 mEq/L   CO2 23  19 - 32 mEq/L   Glucose, Bld 106 (*) 70 - 99 mg/dL   BUN 21  6 - 23 mg/dL   Creatinine, Ser 0.83  0.50 - 1.35 mg/dL   Calcium 8.8  8.4 - 10.5 mg/dL   Total Protein 6.3  6.0 - 8.3 g/dL   Albumin 2.6 (*) 3.5 - 5.2 g/dL   AST 37  0 - 37 U/L   Comment: HEMOLYSIS AT THIS LEVEL MAY AFFECT RESULT   ALT 47  0 - 53 U/L   Comment: HEMOLYSIS AT THIS LEVEL MAY AFFECT RESULT   Alkaline Phosphatase 137 (*) 39 - 117 U/L   Comment: HEMOLYSIS AT THIS LEVEL MAY AFFECT RESULT   Total Bilirubin 1.8 (*) 0.3 - 1.2 mg/dL   GFR calc non Af Amer 85 (*) >90 mL/min   GFR calc Af Amer >90  >90 mL/min   Comment: (NOTE)     The eGFR has  been calculated using the CKD EPI equation.     This calculation has not been validated in all clinical situations.     eGFR's persistently <90 mL/min signify possible Chronic Kidney     Disease.  LIPASE, BLOOD     Status: None   Collection Time    09/22/13  6:40 AM      Result Value Ref Range   Lipase 23  11 - 59 U/L  CBC WITH DIFFERENTIAL     Status: Abnormal   Collection Time    09/22/13  6:40 AM      Result Value Ref Range   WBC 7.3  4.0 - 10.5 K/uL   RBC 4.32  4.22 - 5.81 MIL/uL   Hemoglobin 12.7 (*) 13.0 - 17.0 g/dL   HCT 38.1 (*) 39.0 - 52.0 %   MCV 88.2  78.0 - 100.0 fL   MCH 29.4  26.0 - 34.0 pg   MCHC 33.3  30.0 - 36.0 g/dL   RDW 14.1  11.5 - 15.5 %   Platelets 125 (*) 150 - 400 K/uL   Neutrophils Relative % 76  43 - 77 %   Neutro Abs 5.5  1.7 - 7.7 K/uL   Lymphocytes Relative 11 (*) 12 - 46 %   Lymphs Abs 0.8  0.7 - 4.0 K/uL   Monocytes Relative 11  3 - 12 %   Monocytes Absolute 0.8  0.1 - 1.0 K/uL   Eosinophils Relative 2  0 - 5 %   Eosinophils Absolute 0.1  0.0 - 0.7 K/uL   Basophils Relative 0  0 - 1 %   Basophils Absolute 0.0  0.0 - 0.1 K/uL    Imaging / Studies: Dg Chest 2 View  09/21/2013   CLINICAL DATA:  Postop from cholecystectomy.  Productive cough.  EXAM: CHEST  2 VIEW  COMPARISON:   09/17/2013  FINDINGS: Lordotic position noted. The heart size and mediastinal contours are within normal limits. Both lungs are clear. No evidence of pneumothorax or pleural effusion.  IMPRESSION: No active disease.   Electronically Signed   By: Earle Gell M.D.   On: 09/21/2013 18:33   Dg Cholangiogram Operative  09/20/2013   CLINICAL DATA:  Cholelithiasis  EXAM: INTRAOPERATIVE CHOLANGIOGRAM  FLUOROSCOPY TIME:  8 seconds  COMPARISON:  IR CHOLAN EXIST TUBE dated 09/18/2013; IR PERC CHOLECYSTOSTOMY dated 07/17/2013; CT ABD/PELV WO CM dated 09/17/2013  FINDINGS: Intraoperative angiographic images of the right upper abdominal quadrant during laparoscopic cholecystectomy are provided for review.  Surgical clips overlie the expected location of the gallbladder fossa.  Contrast injection demonstrates selective cannulation of the central aspect of the cystic duct.  There is passage of contrast through the central aspect of the cystic duct with filling of a non dilated common bile duct. There is passage of contrast though the CBD and into the descending portion of the duodenum.  There is minimal reflux of injected contrast into the common hepatic duct and central aspect of the non dilated intrahepatic biliary system.  There are no discrete filling defects within the opacified portions of the biliary system to suggest the presence of choledocholithiasis.  Enteric tube tip and side port overlie the expected location of the gastric antrum.  IMPRESSION: No evidence of choledocholithiasis.   Electronically Signed   By: Sandi Mariscal M.D.   On: 09/20/2013 11:30    Medications / Allergies: per chart  Antibiotics: Anti-infectives   Start     Dose/Rate Route Frequency Ordered Stop  09/20/13 0600  ceFAZolin (ANCEF) IVPB 2 g/50 mL premix     2 g 100 mL/hr over 30 Minutes Intravenous On call to O.R. 09/19/13 1027 09/20/13 0950   09/18/13 0400  vancomycin (VANCOCIN) IVPB 1000 mg/200 mL premix  Status:  Discontinued     1,000  mg 200 mL/hr over 60 Minutes Intravenous Every 12 hours 09/17/13 1557 09/18/13 1324   09/17/13 2330  piperacillin-tazobactam (ZOSYN) IVPB 3.375 g  Status:  Discontinued     3.375 g 12.5 mL/hr over 240 Minutes Intravenous Every 8 hours 09/17/13 1557 09/17/13 2016   09/17/13 2200  ceFEPIme (MAXIPIME) 2 g in dextrose 5 % 50 mL IVPB     2 g 100 mL/hr over 30 Minutes Intravenous Every 12 hours 09/17/13 2016     09/17/13 1515  piperacillin-tazobactam (ZOSYN) IVPB 3.375 g     3.375 g 100 mL/hr over 30 Minutes Intravenous  Once 09/17/13 1505 09/17/13 1625   09/17/13 1515  vancomycin (VANCOCIN) IVPB 1000 mg/200 mL premix  Status:  Discontinued     1,000 mg 200 mL/hr over 60 Minutes Intravenous  Once 09/17/13 1505 09/17/13 1510   09/17/13 1515  vancomycin (VANCOCIN) 2,000 mg in sodium chloride 0.9 % 500 mL IVPB     2,000 mg 250 mL/hr over 120 Minutes Intravenous  Once 09/17/13 1510 09/17/13 1823      Assessment/Plan  Cholecystitis s/p perc chole drain s/p lap chole with IOC---Dr. Brantley Stage 3/20  Bacteremia--e coli per blood cultures, repeat [facility] 3/19 negative to date  transaminitis  -advance to soft diet -add miralax -MOBILIZE  -IS/flutter valve  -LFTs are trending down, likely passed a stone, IOC negative  -resume plavix  -continue drain care--monitor for bile leak -continue with Maxipime   Erby Pian, [facility] Surgery Pager [phone] Office [phone]  09/22/2013 8:44 AM

## 2013-09-22 NOTE — Progress Notes (Signed)
Improving slowly.  Can D/C JP prior to D/C.

## 2013-09-22 NOTE — Progress Notes (Signed)
PROGRESS NOTE  [name] [id number] DOB: [birth year] DOA: 09/17/2013 PCP: [name], MD  [name] is a 73 y.o. male with multiple comorbidities including coronary artery disease, hypertension, dyslipidemia, morbid obesity, admitted to the surgery service on 07/15/2013, which time he presented with abdominal pain, diagnosed with acute cholecystitis. Given multiple comorbidities, felt to be a high surgical risk for which he underwent percutaneous cholecystostomy drain placement. He was discharged from the surgical service on 07/19/2013. He followed up with cardiology for preoperative clearance. Stress test on her 09/05/2013 revealed reversible ischemia. 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 Cardiology felt that  medical management would be most appropriate, as multiple lesions would not respond well to stenting.  On admission he was noted by his wife to appear acutely ill, lethargic, diaphoretic, having fevers and chills. Patient reported feeling "sick" but could not give more detail. He also reports that he became so weak he was unable to stand on his own. In the ED denies chest pain, shortness of breath, palpitations, diarrhea, dysuria, hematemesis, bright red blood per rectum or melena.  Subjective: Seen with wife at bedside, denies any complaints. Some discomfort in his stomach. Wants to get up and walk around today.  Assessment/Plan:  Sepsis Secondary to E-coli bacteremia Patient now stabilized on IV fluids and antibiotic Acute sepsis resolved.  Ecoli bacteremia 2 of 2 blood cultures are positive for ecoli from 3/17.   Being treated with IV Cefepime.  Will need 14 days of antibiotic therapy, likely can be discharged on oral Cipro. Second set of Bld Cx are negative to date.    Cholecystitis with acute cholangitis Patient had a cholecystostomy tube placed on 07/17/2013 as he was considered  not appropriate for surgery at that time. Patient undergone cholecystectomy 3/20,  drain in place. Appreciate GI consultation.  CBD had no stones on cholangiogram 3/18 Will continue with antibiotic therapy.  CAD Restart aspirin and Plavix.  Emphysema Currently stable and asymptomatic  HTN Coreg continued.   Imdur being held Hydralazine PRN  HLD  GERD On protonix.    DVT Prophylaxis:  lovenox  Code Status: full Family Communication: wife at bedside. Disposition Plan: home when appropriate.  Will need PT consult after surgery - ordered.   Consultants: [facility]  Procedures:  cholecystectomy  Antibiotics: Anti-infectives   Start     Dose/Rate Route Frequency Ordered Stop   09/20/13 0600  ceFAZolin (ANCEF) IVPB 2 g/50 mL premix     2 g 100 mL/hr over 30 Minutes Intravenous On call to O.R. 09/19/13 1027 09/20/13 0950   09/18/13 0400  vancomycin (VANCOCIN) IVPB 1000 mg/200 mL premix  Status:  Discontinued     1,000 mg 200 mL/hr over 60 Minutes Intravenous Every 12 hours 09/17/13 1557 09/18/13 1324   09/17/13 2330  piperacillin-tazobactam (ZOSYN) IVPB 3.375 g  Status:  Discontinued     3.375 g 12.5 mL/hr over 240 Minutes Intravenous Every 8 hours 09/17/13 1557 09/17/13 2016   09/17/13 2200  ceFEPIme (MAXIPIME) 2 g in dextrose 5 % 50 mL IVPB     2 g 100 mL/hr over 30 Minutes Intravenous Every 12 hours 09/17/13 2016     09/17/13 1515  piperacillin-tazobactam (ZOSYN) IVPB 3.375 g     3.375 g 100 mL/hr over 30 Minutes Intravenous  Once 09/17/13 1505 09/17/13 1625   09/17/13 1515  vancomycin (VANCOCIN) IVPB 1000 mg/200  mL premix  Status:  Discontinued     1,000 mg 200 mL/hr over 60 Minutes Intravenous  Once 09/17/13 1505 09/17/13 1510   09/17/13 1515  vancomycin (VANCOCIN) 2,000 mg in sodium chloride 0.9 % 500 mL IVPB     2,000 mg 250 mL/hr over 120 Minutes Intravenous  Once 09/17/13 1510 09/17/13 1823       HPI/Subjective: Anticipating  surgery.  Objective: Filed Vitals:   09/21/13 1827 09/21/13 2056 09/22/13 0500 09/22/13 0545  BP: 187/92 165/82  171/80  Pulse: 86 82  75  Temp:  97.9 F (36.6 C)  97.7 F (36.5 C)  TempSrc:  Oral  Oral  Resp:  18  18  Height:      Weight:   86.592 kg (190 lb 14.4 oz)   SpO2:  95%  94%    Intake/Output Summary (Last 24 hours) at 09/22/13 1017 Last data filed at 09/22/13 0700  Gross per 24 hour  Intake    443 ml  Output   1215 ml  Net   -772 ml   Filed Weights   09/20/13 0513 09/21/13 0703 09/22/13 0500  Weight: 82.419 kg (181 lb 11.2 oz) 84.097 kg (185 lb 6.4 oz) 86.592 kg (190 lb 14.4 oz)    Exam: General: Well developed, well nourished, NAD, lying comfortably in bed. HEENT:  Pupils pin point, Anicteic Sclera, MMM. No pharyngeal erythema or exudates  Neck: Supple, no JVD, no masses  Cardiovascular: RRR, S1 S2 auscultated, no rubs, murmurs or gallops.   Respiratory: Clear to auscultation bilaterally with equal chest rise  Abdomen: Soft, nontender, nondistended, + bowel sounds, +percutaneous cholecystomy drain. Bandage clean and dry. Extremities: warm dry without cyanosis clubbing or edema.  Neuro: AAOx3, cranial nerves grossly intact. Strength 5/5 in upper and lower extremities  Skin: Without rashes exudates or nodules.   Psych: Normal affect and demeanor with intact judgement and insight   Data Reviewed: Basic Metabolic Panel:  Recent Labs Lab 09/18/13 0229 09/19/13 0835 09/19/13 1515 09/20/13 0633 09/22/13 0640  NA 141 143 138 140 139  K 4.8 4.4 4.2 4.0 4.6  CL 104 102 99 101 101  CO2 22 26 26 27 23   GLUCOSE 128* 98 107* 103* 106*  BUN 17 21 18 16 21   CREATININE 1.20 1.29 1.10 1.10 0.83  CALCIUM 8.3* 8.9 8.7 8.9 8.8   Liver Function Tests:  Recent Labs Lab 09/18/13 0229 09/19/13 0835 09/19/13 1515 09/20/13 0633 09/22/13 0640  AST 112* 50* 53* 35 37  ALT 166* 118* 105* 88* 47  ALKPHOS 169* 162* 164* 169* 137*  BILITOT 5.9* 2.5* 2.2* 1.9* 1.8*   PROT 5.7* 6.6 6.4 6.4 6.3  ALBUMIN 2.8* 3.0* 2.9* 2.8* 2.6*    Recent Labs Lab 09/17/13 1459 09/22/13 0640  LIPASE 19 23   CBC:  Recent Labs Lab 09/17/13 1459  09/18/13 0229 09/19/13 0835 09/19/13 1515 09/21/13 0500 09/22/13 0640  WBC 10.5  --  11.7* 6.7 7.0 9.1 7.3  NEUTROABS 10.1*  --   --   --  5.8  --  5.5  HGB 14.2  < > 11.8* 13.2 12.8* 13.2 12.7*  HCT 41.3  < > 34.8* 39.5 38.2* 39.5 38.1*  MCV 86.0  --  87.4 88.2 87.4 88.2 88.2  PLT 113*  --  110* 125* 124* 128* 125*  < > = values in this interval not displayed. Cardiac Enzymes:  Recent Labs Lab 09/17/13 1459  TROPONINI <0.30   BNP (last 3 results)  Recent Labs  09/17/13 1459  PROBNP 756.4*   CBG:  Recent Labs Lab 09/17/13 1614  GLUCAP 110*    Recent Results (from the past 240 hour(s))  CULTURE, BLOOD (ROUTINE X 2)     Status: None   Collection Time    09/17/13  3:10 PM      Result Value Ref Range Status   Specimen Description BLOOD ARM RIGHT   Final   Special Requests BOTTLES DRAWN AEROBIC AND ANAEROBIC 5CC   Final   Culture  Setup Time     Final   Value: 09/17/2013 18:52     Performed at [facility]   Culture     Final   Value: ESCHERICHIA COLI     Note: Gram Stain Report Called to,Read Back By and Verified With: [place] USSERY AT 4:41 A.M. ON 09/18/13 WARRB     Performed at [facility]   Report Status 09/21/2013 FINAL   Final   Organism ID, Bacteria ESCHERICHIA COLI   Final  CULTURE, BLOOD (ROUTINE X 2)     Status: None   Collection Time    09/17/13  3:16 PM      Result Value Ref Range Status   Specimen Description BLOOD RIGHT FOREARM   Final   Special Requests BOTTLES DRAWN AEROBIC AND ANAEROBIC 5CC   Final   Culture  Setup Time     Final   Value: 09/17/2013 18:53     Performed at [facility]   Culture     Final   Value: ESCHERICHIA COLI     Note: SUSCEPTIBILITIES PERFORMED ON PREVIOUS CULTURE WITHIN THE LAST 5 DAYS.     Note: Gram Stain Report Called  to,Read Back By and Verified With: [place] USSERY 09/18/13 0624A [facility]     Performed at [facility]   Report Status 09/21/2013 FINAL   Final  URINE CULTURE     Status: None   Collection Time    09/17/13  4:10 PM      Result Value Ref Range Status   Specimen Description URINE, CATHETERIZED   Final   Special Requests NONE   Final   Culture  Setup Time     Final   Value: 09/17/2013 16:58     Performed at [facility]     Final   Value: NO GROWTH     Performed at [facility]   Culture     Final   Value: NO GROWTH     Performed at [facility]   Report Status 09/18/2013 FINAL   Final  MRSA PCR SCREENING     Status: None   Collection Time    09/17/13 11:10 PM      Result Value Ref Range Status   MRSA by PCR NEGATIVE  NEGATIVE Final   Comment:            The GeneXpert MRSA Assay (FDA     approved for NASAL specimens     only), is one component of a     comprehensive MRSA colonization     surveillance program. It is not     intended to diagnose MRSA     infection nor to guide or     monitor treatment for     MRSA infections.  CULTURE, BLOOD (ROUTINE X 2)     Status: None   Collection Time    09/19/13  1:25 PM      Result  Value Ref Range Status   Specimen Description BLOOD RIGHT ARM   Final   Special Requests BOTTLES DRAWN AEROBIC AND ANAEROBIC 10CC   Final   Culture  Setup Time     Final   Value: 09/19/2013 17:07     Performed at [facility]   Culture     Final   Value:        BLOOD CULTURE RECEIVED NO GROWTH TO DATE CULTURE WILL BE HELD FOR 5 DAYS BEFORE ISSUING A FINAL NEGATIVE REPORT     Performed at [facility]   Report Status PENDING   Incomplete  CULTURE, BLOOD (ROUTINE X 2)     Status: None   Collection Time    09/19/13  1:31 PM      Result Value Ref Range Status   Specimen Description BLOOD RIGHT HAND   Final   Special Requests BOTTLES DRAWN AEROBIC AND ANAEROBIC 10CC   Final   Culture  Setup Time      Final   Value: 09/19/2013 17:07     Performed at [facility]   Culture     Final   Value:        BLOOD CULTURE RECEIVED NO GROWTH TO DATE CULTURE WILL BE HELD FOR 5 DAYS BEFORE ISSUING A FINAL NEGATIVE REPORT     Performed at [facility]   Report Status PENDING   Incomplete  SURGICAL PCR SCREEN     Status: None   Collection Time    09/20/13 12:44 AM      Result Value Ref Range Status   MRSA, PCR NEGATIVE  NEGATIVE Final   Staphylococcus aureus NEGATIVE  NEGATIVE Final   Comment:            The Xpert SA Assay (FDA     approved for NASAL specimens     in patients over 42 years of age),     is one component of     a comprehensive surveillance     program.  Test performance has     been validated by [employer] for patients greater     than or equal to 28 year old.     It is not intended     to diagnose infection nor to     guide or monitor treatment.     Studies: Dg Chest 2 View  09/21/2013   CLINICAL DATA:  Postop from cholecystectomy.  Productive cough.  EXAM: CHEST  2 VIEW  COMPARISON:  09/17/2013  FINDINGS: Lordotic position noted. The heart size and mediastinal contours are within normal limits. Both lungs are clear. No evidence of pneumothorax or pleural effusion.  IMPRESSION: No active disease.   Electronically Signed   By: Earle Gell M.D.   On: 09/21/2013 18:33   Dg Cholangiogram Operative  09/20/2013   CLINICAL DATA:  Cholelithiasis  EXAM: INTRAOPERATIVE CHOLANGIOGRAM  FLUOROSCOPY TIME:  8 seconds  COMPARISON:  IR CHOLAN EXIST TUBE dated 09/18/2013; IR PERC CHOLECYSTOSTOMY dated 07/17/2013; CT ABD/PELV WO CM dated 09/17/2013  FINDINGS: Intraoperative angiographic images of the right upper abdominal quadrant during laparoscopic cholecystectomy are provided for review.  Surgical clips overlie the expected location of the gallbladder fossa.  Contrast injection demonstrates selective cannulation of the central aspect of the cystic duct.  There is passage of  contrast through the central aspect of the cystic duct with filling of a non dilated common bile duct. There is passage of contrast though the  CBD and into the descending portion of the duodenum.  There is minimal reflux of injected contrast into the common hepatic duct and central aspect of the non dilated intrahepatic biliary system.  There are no discrete filling defects within the opacified portions of the biliary system to suggest the presence of choledocholithiasis.  Enteric tube tip and side port overlie the expected location of the gastric antrum.  IMPRESSION: No evidence of choledocholithiasis.   Electronically Signed   By: Sandi Mariscal M.D.   On: 09/20/2013 11:30    Scheduled Meds: . carvedilol  6.25 mg Oral BID  . ceFEPime (MAXIPIME) IV  2 g Intravenous Q12H  . enoxaparin (LOVENOX) injection  40 mg Subcutaneous Q24H  . guaiFENesin  1,200 mg Oral BID  . pantoprazole  40 mg Oral Daily  . pneumococcal 23 valent vaccine  0.5 mL Intramuscular Tomorrow-1000  . polyethylene glycol  17 g Oral Daily  . simvastatin  20 mg Oral q1800  . sodium chloride  3 mL Intravenous Q12H   Continuous Infusions: . sodium chloride 10 mL/hr (09/21/13 1646)    Principal Problem:   Sepsis, Gram negative Active Problems:   GERD   Fatigue   Cholecystitis   Acute febrile illness   CAD (coronary artery disease)   Ascending cholangitis    [facility]  [facility] Pager [phone]. If 7PM-7AM, please contact night-coverage at [email or web address], password [facility] 09/22/2013, 10:17 AM  LOS: 5 days

## 2013-09-23 DIAGNOSIS — R0989 Other specified symptoms and signs involving the circulatory and respiratory systems: Secondary | ICD-10-CM

## 2013-09-23 DIAGNOSIS — I739 Peripheral vascular disease, unspecified: Secondary | ICD-10-CM

## 2013-09-23 LAB — COMPREHENSIVE METABOLIC PANEL
ALK PHOS: 151 U/L — AB (ref 39–117)
ALT: 34 U/L (ref 0–53)
AST: 17 U/L (ref 0–37)
Albumin: 2.8 g/dL — ABNORMAL LOW (ref 3.5–5.2)
BUN: 18 mg/dL (ref 6–23)
CO2: 25 mEq/L (ref 19–32)
Calcium: 8.9 mg/dL (ref 8.4–10.5)
Chloride: 102 mEq/L (ref 96–112)
Creatinine, Ser: 0.8 mg/dL (ref 0.50–1.35)
GFR calc non Af Amer: 86 mL/min — ABNORMAL LOW (ref >90)
GLUCOSE: 120 mg/dL — AB (ref 70–99)
Potassium: 3.9 mEq/L (ref 3.7–5.3)
Sodium: 141 mEq/L (ref 137–147)
Total Bilirubin: 1.5 mg/dL — ABNORMAL HIGH (ref 0.3–1.2)
Total Protein: 6.4 g/dL (ref 6.0–8.3)

## 2013-09-23 MED ORDER — CIPROFLOXACIN HCL 750 MG PO TABS: 750.0000 mg | ORAL_TABLET | Freq: Two times a day (BID) | ORAL | Status: DC

## 2013-09-23 MED ORDER — TAMSULOSIN HCL 0.4 MG PO CAPS
0.4000 mg | ORAL_CAPSULE | Freq: Every day | ORAL | Status: DC
  Filled 2013-09-23: qty 1

## 2013-09-23 MED ORDER — FINASTERIDE 5 MG PO TABS
5.0000 mg | ORAL_TABLET | Freq: Every evening | ORAL | Status: DC
  Filled 2013-09-23: qty 1

## 2013-09-23 MED ORDER — OXYCODONE HCL 5 MG PO TABS: 5.0000 mg | ORAL_TABLET | ORAL | Status: DC | PRN

## 2013-09-23 MED ORDER — ISOSORBIDE MONONITRATE ER 30 MG PO TB24
30.0000 mg | ORAL_TABLET | Freq: Every day | ORAL | Status: DC
  Administered 2013-09-23: 30 mg via ORAL
  Filled 2013-09-23: qty 1

## 2013-09-23 MED ORDER — ASPIRIN 81 MG PO CHEW: 81.0000 mg | CHEWABLE_TABLET | Freq: Every day | ORAL | Status: DC

## 2013-09-23 NOTE — Plan of Care (Signed)
Problem: Phase I Progression Outcomes Goal: OOB as tolerated unless otherwise ordered Outcome: Completed/Met Date Met:  09/23/13 OOB to chair. Tolerating well.      

## 2013-09-23 NOTE — Progress Notes (Signed)
Physical Therapy Treatment Patient Details Name: [name] MRN: [id number] DOB: Sep 03, 1940 Today's Date: 09/23/2013 Time: 9735-3299 PT Time Calculation (min): 11 min  PT Assessment / Plan / Recommendation  History of Present Illness 73 y.o. male admitted to [facility] on 09/17/13 with nausea abdominal pain and weakness. Of significance he was recently being followed by surgery service (with previous admission) for acute cholecystitis with drain placement.  Dx with sepsis and acute enchepholopathy as well as continued cholecystitis.  Pt now is s/p laporoscopic cholecystectomy and has a post-op JP drain.     PT Comments   *Increased gait distance today, decreased assist required for transfers. Ready to DC home from PT standpoint. Pt declined stair training. Exercises deferred 2* pt's lunch arrived and he wanted to eat.  **  Follow Up Recommendations  Home health PT;Supervision/Assistance - 24 hour     Does the patient have the potential to tolerate intense rehabilitation     Barriers to Discharge        Equipment Recommendations  None recommended by PT (pt has RW at home)    Recommendations for Other Services    Frequency Min 3X/week   Progress towards PT Goals Progress towards PT goals: Progressing toward goals  Plan Current plan remains appropriate    Precautions / Restrictions Precautions Precautions: Fall Precaution Comments: pt weak and deconditioned, monitor vitals with mobility as he gets very DOE and lightheaded in standing.    Pertinent Vitals/Pain *0/10**    Mobility  Transfers Overall transfer level: Needs assistance Equipment used: Rolling walker (2 wheeled) Transfers: Sit to/from Stand Sit to Stand: Supervision General transfer comment: VCs for hand placement Ambulation/Gait Ambulation/Gait assistance: Supervision Ambulation Distance (Feet): 180 Feet Assistive device: Rolling walker (2 wheeled) Gait Pattern/deviations: Step-through pattern Gait velocity  interpretation: at or above normal speed for age/gender    Exercises     PT Diagnosis:    PT Problem List:   PT Treatment Interventions:     PT Goals (current goals can now be found in the care plan section) Acute Rehab PT Goals Patient Stated Goal: to go home PT Goal Formulation: With patient/family Time For Goal Achievement: 10/05/13 Potential to Achieve Goals: Good  Visit Information  Last PT Received On: 09/23/13 Assistance Needed: +1 History of Present Illness: 73 y.o. male admitted to [facility] on 09/17/13 with nausea abdominal pain and weakness. Of significance he was recently being followed by surgery service (with previous admission) for acute cholecystitis with drain placement.  Dx with sepsis and acute enchepholopathy as well as continued cholecystitis.  Pt now is s/p laporoscopic cholecystectomy and has a post-op JP drain.      Subjective Data  Patient Stated Goal: to go home   Cognition  Cognition Arousal/Alertness: Awake/alert Behavior During Therapy: WFL for tasks assessed/performed Overall Cognitive Status: Within Functional Limits for tasks assessed (not specifically tested)    Balance     End of Session PT - End of Session Equipment Utilized During Treatment: Gait belt Activity Tolerance: Patient tolerated treatment well (limited by DOE) Patient left: in chair;with call bell/phone within reach;with family/visitor present Nurse Communication: Mobility status (left pt on RA due to good O2 sats)   GP     Philomena Doheny 09/23/2013, 1:29 PM [phone]

## 2013-09-23 NOTE — Discharge Instructions (Signed)
LAPAROSCOPIC SURGERY: POST OP INSTRUCTIONS ° °1. DIET: Follow a light bland diet the first 24 hours after arrival home, such as soup, liquids, crackers, etc.  Be sure to include lots of fluids daily.  Avoid fast food or heavy meals as your are more likely to get nauseated.  Eat a low fat the next few days after surgery.   °2. Take your usually prescribed home medications unless otherwise directed. °3. PAIN CONTROL: °a. Pain is best controlled by a usual combination of three different methods TOGETHER: °i. Ice/Heat °ii. Over the counter pain medication °iii. Prescription pain medication °b. Most patients will experience some swelling and bruising around the incisions.  Ice packs or heating pads (30-60 minutes up to 6 times a day) will help. Use ice for the first few days to help decrease swelling and bruising, then switch to heat to help relax tight/sore spots and speed recovery.  Some people prefer to use ice alone, heat alone, alternating between ice & heat.  Experiment to what works for you.  Swelling and bruising can take several weeks to resolve.   °c. It is helpful to take an over-the-counter pain medication regularly for the first few weeks.  Choose one of the following that works best for you: °i. Naproxen (Aleve, etc)  Two 220mg tabs twice a day °ii. Ibuprofen (Advil, etc) Three 200mg tabs four times a day (every meal & bedtime) °iii. Acetaminophen (Tylenol, etc) 500-650mg four times a day (every meal & bedtime) °d. A  prescription for pain medication (such as oxycodone, hydrocodone, etc) should be given to you upon discharge.  Take your pain medication as prescribed.  °i. If you are having problems/concerns with the prescription medicine (does not control pain, nausea, vomiting, rash, itching, etc), please call us [phone] to see if we need to switch you to a different pain medicine that will work better for you and/or control your side effect better. °ii. If you need a refill on your pain medication,  please contact your pharmacy.  They will contact our office to request authorization. Prescriptions will not be filled after 5 pm or on week-ends. °4. Avoid getting constipated.  Between the surgery and the pain medications, it is common to experience some constipation.  Increasing fluid intake and taking a fiber supplement (such as Metamucil, Citrucel, FiberCon, MiraLax, etc) 1-2 times a day regularly will usually help prevent this problem from occurring.  A mild laxative (prune juice, Milk of Magnesia, MiraLax, etc) should be taken according to package directions if there are no bowel movements after 48 hours.   °5. Watch out for diarrhea.  If you have many loose bowel movements, simplify your diet to bland foods & liquids for a few days.  Stop any stool softeners and decrease your fiber supplement.  Switching to mild anti-diarrheal medications (Kayopectate, Pepto Bismol) can help.  If this worsens or does not improve, please call us. °6. Wash / shower every day.  You may shower over the dressings as they are waterproof.  Continue to shower over incision(s) after the dressing is off. °7. Remove your waterproof bandages 5 days after surgery.  You may leave the incision open to air.  You may replace a dressing/Band-Aid to cover the incision for comfort if you wish.  °8. ACTIVITIES as tolerated:   °a. You may resume regular (light) daily activities beginning the next day--such as daily self-care, walking, climbing stairs--gradually increasing activities as tolerated.  If you can walk 30 minutes without difficulty, it   is safe to try more intense activity such as jogging, treadmill, bicycling, low-impact aerobics, swimming, etc. °b. Save the most intensive and strenuous activity for last such as sit-ups, heavy lifting, contact sports, etc  Refrain from any heavy lifting or straining until you are off narcotics for pain control.   °c. DO NOT PUSH THROUGH PAIN.  Let pain be your guide: If it hurts to do something, don't  do it.  Pain is your body warning you to avoid that activity for another week until the pain goes down. °d. You may drive when you are no longer taking prescription pain medication, you can comfortably wear a seatbelt, and you can safely maneuver your car and apply brakes. °e. You may have sexual intercourse when it is comfortable.  °9. FOLLOW UP in our office °a. Please call [facility] at [phone] to set up an appointment to see your surgeon in the office for a follow-up appointment approximately 2-3 weeks after your surgery. °b. Make sure that you call for this appointment the day you arrive home to insure a convenient appointment time. °10. IF YOU HAVE DISABILITY OR FAMILY LEAVE FORMS, BRING THEM TO THE OFFICE FOR PROCESSING.  DO NOT GIVE THEM TO YOUR DOCTOR. ° ° °WHEN TO CALL US [phone]: °1. Poor pain control °2. Reactions / problems with new medications (rash/itching, nausea, etc)  °3. Fever over 101.5 F (38.5 C) °4. Inability to urinate °5. Nausea and/or vomiting °6. Worsening swelling or bruising °7. Continued bleeding from incision. °8. Increased pain, redness, or drainage from the incision ° ° The clinic staff is available to answer your questions during regular business hours (8:30am-5pm).  Please don’t hesitate to call and ask to speak to one of our nurses for clinical concerns.  ° If you have a medical emergency, go to the nearest emergency room or call 911. ° A surgeon from [facility] is always on call at the hospitals ° ° °[facility] °[address], [address], [place], [place]  [address] ? °MAIN: [phone] ? TOLL FREE: [phone] ?  °FAX [phone] °[email or web address] ° °DRAIN CARE:   °You have a closed bulb drain to help you heal. ° °A bulb drain is a small, plastic reservoir which creates a gentle suction. It is used to remove excess fluid from a surgical wound. The color and amount of fluid will vary. Immediately after surgery, the  fluid is bright red. It may gradually change to a yellow color. When the amount decreases to about 1 or 2 tablespoons (15 to 30 cc) per 24 hours, your caregiver will usually remove it. ° °DAILY CARE °· Keep the bulb compressed at all times, except while emptying it. The compression creates suction.  °· Keep sites where the tubes enter the skin dry and covered with a light bandage (dressing).  °· Tape the tubes to your skin, 1 to 2 inches below the insertion sites, to keep from pulling on your stitches. Tubes are stitched in place and will not slip out.  °· Pin the bulb to your shirt (not to your pants) with a safety pin.  °· For the first few days after surgery, there usually is more fluid in the bulb. Empty the bulb whenever it becomes half full because the bulb does not create enough suction if it is too full. Include this amount in your 24 hour totals.  °· When the amount of drainage decreases, empty the bulb at the same time every   day. Write down the amounts and the 24 hour totals. Your caregiver will want to know them. This helps your caregiver know when the tubes can be removed.  °· (We anticipate removing the drain in 1-3 weeks, depending on when the output is <30mL a day for 2+ days) °· If there is drainage around the tube sites, change dressings and keep the area dry. If you see a clot in the tube, leave it alone. However, if the tube does not appear to be draining, let your caregiver know.  °TO EMPTY THE BULB °· Open the stopper to release suction.  °· Holding the stopper out of the way, pour drainage into the measuring cup that was sent home with you.  °· Measure and write down the amount. If there are 2 bulbs, note the amount of drainage from bulb 1 or bulb 2 and keep the totals separate. Your caregiver will want to know which tube is draining more.  °· Compress the bulb by folding it in half.  °· Replace the stopper.  °· Check the tape that holds the tube to your skin, and pin the bulb to your shirt.    °SEEK MEDICAL CARE IF: °· The drainage develops a bad odor.  °· You have an oral temperature above 102° F (38.9° C).  °· The amount of drainage from your wound suddenly increases or decreases.  °· You accidentally pull out your drain.  °· You have any other questions or concerns.  °MAKE SURE YOU:  °· Understand these instructions.  °· Will watch your condition.  °· Will get help right away if you are not doing well or get worse.  ° ° °· Call our office if you have any questions about your drain. [phone] °·  °

## 2013-09-23 NOTE — Progress Notes (Signed)
Nsg Discharge Note  Admit Date:  09/17/2013 Discharge date: 09/23/2013   ALBEN JEPSEN to be D/C'd Home per MD order.  AVS completed.  Copy for chart, and copy for patient signed, and dated. Patient/caregiver able to verbalize understanding.  Discharge Medication:   Medication List    STOP taking these medications       aspirin 325 MG EC tablet  Replaced by:  aspirin 81 MG chewable tablet      TAKE these medications       aspirin 81 MG chewable tablet  Chew 1 tablet (81 mg total) by mouth daily.     carvedilol 6.25 MG tablet  Commonly known as:  COREG  Take 1 tablet (6.25 mg total) by mouth 2 (two) times daily.     ciprofloxacin 750 MG tablet  Commonly known as:  CIPRO  Take 1 tablet (750 mg total) by mouth 2 (two) times daily.     clopidogrel 75 MG tablet  Commonly known as:  PLAVIX  Take 75 mg by mouth every evening.     finasteride 5 MG tablet  Commonly known as:  PROSCAR  Take 5 mg by mouth every evening.     isosorbide mononitrate 30 MG 24 hr tablet  Commonly known as:  IMDUR  Take 1 tablet (30 mg total) by mouth daily.     omeprazole 20 MG capsule  Commonly known as:  PRILOSEC  Take 20 mg by mouth every morning.     oxyCODONE 5 MG immediate release tablet  Commonly known as:  Oxy IR/ROXICODONE  Take 1 tablet (5 mg total) by mouth every 4 (four) hours as needed for moderate pain.     pravastatin 40 MG tablet  Commonly known as:  PRAVACHOL  Take 20 mg by mouth every evening. Takes half tab daily     tamsulosin 0.4 MG Caps capsule  Commonly known as:  FLOMAX  Take 0.4 mg by mouth daily after supper.        Discharge Assessment: Filed Vitals:   09/23/13 1329  BP: 118/69  Pulse: 94  Temp: 97.5 F (36.4 C)  Resp: 18   Skin clean, dry and intact without evidence of skin break down, no evidence of skin tears noted. IV catheter discontinued intact. Site without signs and symptoms of complications - no redness or edema noted at insertion site,  patient denies c/o pain - only slight tenderness at site.  Dressing with slight pressure applied.  D/c Instructions-Education:[employer] copy.  Discharge instructions given to patient/family with verbalized understanding. D/c education completed with patient/family including follow up instructions, medication list, d/c activities limitations if indicated, with other d/c instructions as indicated by MD - patient able to verbalize understanding, all questions fully answered. Patient instructed to return to ED, call 911, or call MD for any changes in condition.  Patient escorted via [place], and D/C home via private auto. JP drain intact.   Dayle Points, RN 09/23/2013 5:10 PM

## 2013-09-23 NOTE — Discharge Summary (Signed)
Physician Discharge Summary  [name] [id number] DOB: [birth year] DOA: 09/17/2013  PCP: [name], MD  Admit date: 09/17/2013 Discharge date: 09/23/2013  Time spent: 45 minutes  Recommendations for Outpatient Follow-up:  Follow with [facility] on Monday 3/30 for drain removal. Follow up with Dr. Damita Dunnings in 2 weeks - check CBC and CMET. Patient will have home health physical therapy   Discharge Diagnoses:  Principal Problem:   Sepsis, Gram negative Active Problems:   GERD   Fatigue   Cholecystitis   Acute febrile illness   CAD (coronary artery disease)   Ascending cholangitis   Discharge Condition: stable.  Diet recommendation: low fat.  Filed Weights   09/20/13 0513 09/21/13 0703 09/22/13 0500  Weight: 82.419 kg (181 lb 11.2 oz) 84.097 kg (185 lb 6.4 oz) 86.592 kg (190 lb 14.4 oz)    History of present illness:  [name] is a 73 y.o. male with multiple comorbidities including coronary artery disease, hypertension, dyslipidemia, morbid obesity, admitted to the surgery service on 07/15/2013, which time he presented with abdominal pain, diagnosed with acute cholecystitis. Given multiple comorbidities, felt to be a high surgical risk for which he underwent percutaneous cholecystostomy drain placement. He was discharged from the surgical service on 07/19/2013. He followed up with cardiology for preoperative clearance. Stress test on her 09/05/2013 revealed reversible ischemia. 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 Cardiology felt that medical management would be most appropriate, as multiple lesions would not respond well to stenting.   On admission he was noted by his wife to appear acutely ill, lethargic, diaphoretic, having fevers and chills. Patient reported feeling "sick" but could not give more detail. He also reports that he became so weak he was unable to stand on his own. In the ED  denies chest pain, shortness of breath, palpitations, diarrhea, dysuria, hematemesis, bright red blood per rectum or melena.   Hospital Course:  Sepsis  Secondary to E-coli bacteremia  Patient now stabilized on IV fluids and antibiotic  Acute sepsis resolved.   Ecoli bacteremia  2 of 2 blood cultures are positive for ecoli from 3/17.  Being treated with IV Cefepime. Will need 14 days of antibiotic therapy.  Will be discharged on oral Cipro.  Second set of Bld Cx are negative to date.   Cholecystitis with acute cholangitis  Patient had a cholecystostomy tube placed on 07/17/2013 as he was considered not appropriate for surgery at that time.  Patient undergone cholecystectomy 3/20, drain in place.  Drain to be removed at [facility] follow up on 09/30/13 Appreciate GI consultation. CBD had no stones on cholangiogram 3/18  Will continue with antibiotic therapy.   CAD  Restart aspirin and Plavix.   Emphysema  Currently stable and asymptomatic   HTN  Coreg continued. Imdur resumed at discharge.  HLD   GERD  On protonix   Procedures:  Cholecystectomy with drain placement and IOC.  Consultations:  GI  [facility]   Discharge Exam: Filed Vitals:   09/23/13 1329  BP: 118/69  Pulse: 94  Temp: 97.5 F (36.4 C)  Resp: 18   General: Well developed, well nourished, NAD, lying comfortably in bed.  HEENT: PEER, Anicteic Sclera, MMM. No pharyngeal erythema or exudates  Neck: Supple, no JVD, no masses  Cardiovascular: RRR, S1 S2 auscultated, no rubs, murmurs or gallops.  Respiratory: Clear to auscultation bilaterally with equal chest rise  Abdomen: Soft,  nontender, nondistended, + bowel sounds, +percutaneous cholecystomy drain.  Extremities: warm dry without cyanosis clubbing or edema.  Neuro: AAOx3, cranial nerves grossly intact. Strength 5/5 in upper and lower extremities  Skin: Without rashes exudates or nodules.  Psych: Normal affect and demeanor with intact judgement and  insight    Discharge Instructions  Discharge Orders   Future Appointments Provider Department Dept Phone   09/30/2013 3:50 PM Marcello Moores A. Glenn, [facility], [place]   10/04/2013 11:50 AM Liliane Shi, PA-C [place] [facility] [phone]   11/20/2013 8:00 AM Josue Hector, MD [facility] [phone]   Future Orders Complete By Expires   Diet - low sodium heart healthy  As directed    Increase activity slowly  As directed        Medication List    STOP taking these medications       aspirin 325 MG EC tablet  Replaced by:  aspirin 81 MG chewable tablet      TAKE these medications       aspirin 81 MG chewable tablet  Chew 1 tablet (81 mg total) by mouth daily.     carvedilol 6.25 MG tablet  Commonly known as:  COREG  Take 1 tablet (6.25 mg total) by mouth 2 (two) times daily.     ciprofloxacin 750 MG tablet  Commonly known as:  CIPRO  Take 1 tablet (750 mg total) by mouth 2 (two) times daily.     clopidogrel 75 MG tablet  Commonly known as:  PLAVIX  Take 75 mg by mouth every evening.     finasteride 5 MG tablet  Commonly known as:  PROSCAR  Take 5 mg by mouth every evening.     isosorbide mononitrate 30 MG 24 hr tablet  Commonly known as:  IMDUR  Take 1 tablet (30 mg total) by mouth daily.     omeprazole 20 MG capsule  Commonly known as:  PRILOSEC  Take 20 mg by mouth every morning.     oxyCODONE 5 MG immediate release tablet  Commonly known as:  Oxy IR/ROXICODONE  Take 1 tablet (5 mg total) by mouth every 4 (four) hours as needed for moderate pain.     pravastatin 40 MG tablet  Commonly known as:  PRAVACHOL  Take 20 mg by mouth every evening. Takes half tab daily     tamsulosin 0.4 MG Caps capsule  Commonly known as:  FLOMAX  Take 0.4 mg by mouth daily after supper.       Allergies  Allergen Reactions  . Hydrochlorothiazide Other (See Comments)    REACTION: joint pain  . Benazepril Hcl  Other (See Comments)    unknown   Follow-up Information   Follow up with CORNETT,THOMAS A., MD On 09/30/2013. (arrive by 3:40PM  to check your drain and post operative check up)    Specialty:  General Surgery   Contact information:   [place] [address] [phone]       Follow up with [name], MD In 2 weeks.   Specialty:  Family Medicine   Contact information:   [place]  [address] [phone]        The results of significant diagnostics from this hospitalization (including imaging, microbiology, ancillary and laboratory) are listed below for reference.    Significant Diagnostic Studies: Ct Abdomen Pelvis Wo Contrast  09/17/2013   CLINICAL DATA:  Patient is  lethargic.  Generalized weakness.  EXAM: CT ABDOMEN AND PELVIS WITHOUT CONTRAST  TECHNIQUE: Multidetector CT imaging of the abdomen and pelvis was performed following the standard protocol without intravenous contrast.  COMPARISON:  IR PERC CHOLECYSTOSTOMY dated 07/17/2013; CT ABD/PELV WO CM dated 02/05/2012; US ABDOMEN LIMITED dated 07/15/2013  FINDINGS: The lung bases are clear.  No renal, ureteral, or bladder calculi. No obstructive uropathy. No perinephric stranding is seen. The kidneys are symmetric in size without evidence for exophytic mass. There is a Foley catheter present within the bladder. The prostate gland is mildly enlarged measuring 6.4 cm x 4.5 cm in greatest transverse dimension.  The gallbladder wall is thickened. There is a percutaneous cholecystostomy tube coiled within the gallbladder which is decompressed.  The liver demonstrates no focal abnormality. The spleen demonstrates no focal abnormality. The adrenal glands and pancreas are normal.  The stomach, duodenum, small intestine and large intestine are unremarkable. There is no bowel dilatation to suggest obstruction. There is no pneumoperitoneum, pneumatosis, or portal venous gas. There is no abdominal or  pelvic free fluid. There is no lymphadenopathy.  The abdominal aorta is normal in caliber with atherosclerosis.  There is severe degenerative disc disease at L5-S1 with disc height loss. There is mild degenerative disc disease at L4-5. There is bilateral foraminal stenosis at L5-S1.  IMPRESSION:  1. Gallbladder wall thickening with a percutaneous cholecystostomy tube within the gallbladder in satisfactory position. 2. Foley catheter within the bladder in satisfactory position. 3. No bowel obstruction.   Electronically Signed   By: Kathreen Devoid   On: 09/17/2013 18:32   Dg Chest 2 View  09/21/2013   CLINICAL DATA:  Postop from cholecystectomy.  Productive cough.  EXAM: CHEST  2 VIEW  COMPARISON:  09/17/2013  FINDINGS: Lordotic position noted. The heart size and mediastinal contours are within normal limits. Both lungs are clear. No evidence of pneumothorax or pleural effusion.  IMPRESSION: No active disease.   Electronically Signed   By: Earle Gell M.D.   On: 09/21/2013 18:33   Dg Cholangiogram Operative  09/20/2013   CLINICAL DATA:  Cholelithiasis  EXAM: INTRAOPERATIVE CHOLANGIOGRAM  FLUOROSCOPY TIME:  8 seconds  COMPARISON:  IR CHOLAN EXIST TUBE dated 09/18/2013; IR PERC CHOLECYSTOSTOMY dated 07/17/2013; CT ABD/PELV WO CM dated 09/17/2013  FINDINGS: Intraoperative angiographic images of the right upper abdominal quadrant during laparoscopic cholecystectomy are provided for review.  Surgical clips overlie the expected location of the gallbladder fossa.  Contrast injection demonstrates selective cannulation of the central aspect of the cystic duct.  There is passage of contrast through the central aspect of the cystic duct with filling of a non dilated common bile duct. There is passage of contrast though the CBD and into the descending portion of the duodenum.  There is minimal reflux of injected contrast into the common hepatic duct and central aspect of the non dilated intrahepatic biliary system.  There are no  discrete filling defects within the opacified portions of the biliary system to suggest the presence of choledocholithiasis.  Enteric tube tip and side port overlie the expected location of the gastric antrum.  IMPRESSION: No evidence of choledocholithiasis.   Electronically Signed   By: Sandi Mariscal M.D.   On: 09/20/2013 11:30   Ct Head Wo Contrast  09/17/2013   CLINICAL DATA:  Generalized weakness  EXAM: CT HEAD WITHOUT CONTRAST  TECHNIQUE: Contiguous axial images were obtained from the base of the skull through the vertex without intravenous contrast.  COMPARISON:  None.  FINDINGS: There is no evidence of mass effect, midline shift or extra-axial fluid collections. There is no evidence of a space-occupying lesion or intracranial hemorrhage. There is no evidence of a cortical-based area of acute infarction. Mild periventricular white matter low attenuation likely secondary to microangiopathy.  The ventricles and sulci are appropriate for the patient's age. The basal cisterns are patent.  Visualized portions of the orbits are unremarkable. The visualized portions of the paranasal sinuses and mastoid air cells are unremarkable.  The osseous structures are unremarkable.  IMPRESSION: No acute intracranial pathology.   Electronically Signed   By: Kathreen Devoid   On: 09/17/2013 18:25   [place] Tube  09/18/2013   CLINICAL DATA:  Cholecystitis.  Cholecystostomy tube in place  EXAM: CHOLANGIOGRAM VIA EXISTING CATHETER  MEDICATIONS: None.  CONTRAST:  72mL OMNIPAQUE IOHEXOL 300 MG/ML  SOLN  FLUOROSCOPY TIME:  24 seconds.  PROCEDURE: Contrast was injected into the cholecystostomy tube and imaging was obtained.  Complications: None.  FINDINGS: Contrast fills the gallbladder, cystic duct, common bile duct, and duodenum compatible with patency. Several gallstones are seen within the lumen of the gallbladder.  IMPRESSION: Cystic and common bile ducts are patent.  Cholelithiasis.   Electronically Signed   By: Maryclare Bean  M.D.   On: 09/18/2013 17:10   Dg Chest Port 1 View  09/17/2013   CLINICAL DATA:  Sepsis, weakness  EXAM: PORTABLE CHEST - 1 VIEW  COMPARISON:  DG CHEST 1 VIEW dated 07/17/2013; DG CHEST 2 VIEW dated 07/15/2013; DG CHEST 2 VIEW dated 06/21/2012  FINDINGS: The heart size and mediastinal contours are within normal limits. Both lungs are clear. The visualized skeletal structures are unremarkable.  IMPRESSION: No active disease.   Electronically Signed   By: Skipper Cliche M.D.   On: 09/17/2013 15:35    Microbiology: Recent Results (from the past 240 hour(s))  CULTURE, BLOOD (ROUTINE X 2)     Status: None   Collection Time    09/17/13  3:10 PM      Result Value Ref Range Status   Specimen Description BLOOD ARM RIGHT   Final   Special Requests BOTTLES DRAWN AEROBIC AND ANAEROBIC 5CC   Final   Culture  Setup Time     Final   Value: 09/17/2013 18:52     Performed at [facility]   Culture     Final   Value: ESCHERICHIA COLI     Note: Gram Stain Report Called to,Read Back By and Verified With: [place] USSERY AT 4:41 A.M. ON 09/18/13 WARRB     Performed at [facility]   Report Status 09/21/2013 FINAL   Final   Organism ID, Bacteria ESCHERICHIA COLI   Final  CULTURE, BLOOD (ROUTINE X 2)     Status: None   Collection Time    09/17/13  3:16 PM      Result Value Ref Range Status   Specimen Description BLOOD RIGHT FOREARM   Final   Special Requests BOTTLES DRAWN AEROBIC AND ANAEROBIC 5CC   Final   Culture  Setup Time     Final   Value: 09/17/2013 18:53     Performed at [facility]   Culture     Final   Value: ESCHERICHIA COLI     Note: SUSCEPTIBILITIES PERFORMED ON PREVIOUS CULTURE WITHIN THE LAST 5 DAYS.     Note: Gram Stain Report Called to,Read Back By and Verified With: [place] USSERY 09/18/13 0624A [facility]  Performed at [facility]   Report Status 09/21/2013 FINAL   Final  URINE CULTURE     Status: None   Collection Time    09/17/13  4:10 PM      Result  Value Ref Range Status   Specimen Description URINE, CATHETERIZED   Final   Special Requests NONE   Final   Culture  Setup Time     Final   Value: 09/17/2013 16:58     Performed at [place]     Final   Value: NO GROWTH     Performed at [facility]   Culture     Final   Value: NO GROWTH     Performed at [facility]   Report Status 09/18/2013 FINAL   Final  MRSA PCR SCREENING     Status: None   Collection Time    09/17/13 11:10 PM      Result Value Ref Range Status   MRSA by PCR NEGATIVE  NEGATIVE Final   Comment:            The GeneXpert MRSA Assay (FDA     approved for NASAL specimens     only), is one component of a     comprehensive MRSA colonization     surveillance program. It is not     intended to diagnose MRSA     infection nor to guide or     monitor treatment for     MRSA infections.  CULTURE, BLOOD (ROUTINE X 2)     Status: None   Collection Time    09/19/13  1:25 PM      Result Value Ref Range Status   Specimen Description BLOOD RIGHT ARM   Final   Special Requests BOTTLES DRAWN AEROBIC AND ANAEROBIC 10CC   Final   Culture  Setup Time     Final   Value: 09/19/2013 17:07     Performed at [facility]   Culture     Final   Value:        BLOOD CULTURE RECEIVED NO GROWTH TO DATE CULTURE WILL BE HELD FOR 5 DAYS BEFORE ISSUING A FINAL NEGATIVE REPORT     Performed at [facility]   Report Status PENDING   Incomplete  CULTURE, BLOOD (ROUTINE X 2)     Status: None   Collection Time    09/19/13  1:31 PM      Result Value Ref Range Status   Specimen Description BLOOD RIGHT HAND   Final   Special Requests BOTTLES DRAWN AEROBIC AND ANAEROBIC 10CC   Final   Culture  Setup Time     Final   Value: 09/19/2013 17:07     Performed at [facility]   Culture     Final   Value:        BLOOD CULTURE RECEIVED NO GROWTH TO DATE CULTURE WILL BE HELD FOR 5 DAYS BEFORE ISSUING A FINAL NEGATIVE REPORT      Performed at [facility]   Report Status PENDING   Incomplete  SURGICAL PCR SCREEN     Status: None   Collection Time    09/20/13 12:44 AM      Result Value Ref Range Status   MRSA, PCR NEGATIVE  NEGATIVE Final   Staphylococcus aureus NEGATIVE  NEGATIVE Final   Comment:            The Xpert SA Assay (  FDA     approved for NASAL specimens     in patients over 85 years of age),     is one component of     a comprehensive surveillance     program.  Test performance has     been validated by [employer] for patients greater     than or equal to 65 year old.     It is not intended     to diagnose infection nor to     guide or monitor treatment.     Labs: Basic Metabolic Panel:  Recent Labs Lab 09/19/13 0835 09/19/13 1515 09/20/13 0633 09/22/13 0640 09/23/13 0721  NA 143 138 140 139 141  K 4.4 4.2 4.0 4.6 3.9  CL 102 99 101 101 102  CO2 26 26 27 23 25   GLUCOSE 98 107* 103* 106* 120*  BUN 21 18 16 21 18   CREATININE 1.29 1.10 1.10 0.83 0.80  CALCIUM 8.9 8.7 8.9 8.8 8.9   Liver Function Tests:  Recent Labs Lab 09/19/13 0835 09/19/13 1515 09/20/13 0633 09/22/13 0640 09/23/13 0721  AST 50* 53* 35 37 17  ALT 118* 105* 88* 47 34  ALKPHOS 162* 164* 169* 137* 151*  BILITOT 2.5* 2.2* 1.9* 1.8* 1.5*  PROT 6.6 6.4 6.4 6.3 6.4  ALBUMIN 3.0* 2.9* 2.8* 2.6* 2.8*    Recent Labs Lab 09/17/13 1459 09/22/13 0640  LIPASE 19 23   CBC:  Recent Labs Lab 09/17/13 1459  09/18/13 0229 09/19/13 0835 09/19/13 1515 09/21/13 0500 09/22/13 0640  WBC 10.5  --  11.7* 6.7 7.0 9.1 7.3  NEUTROABS 10.1*  --   --   --  5.8  --  5.5  HGB 14.2  < > 11.8* 13.2 12.8* 13.2 12.7*  HCT 41.3  < > 34.8* 39.5 38.2* 39.5 38.1*  MCV 86.0  --  87.4 88.2 87.4 88.2 88.2  PLT 113*  --  110* 125* 124* 128* 125*  < > = values in this interval not displayed. Cardiac Enzymes:  Recent Labs Lab 09/17/13 1459  TROPONINI <0.30   BNP: BNP (last 3 results)  Recent Labs   09/17/13 1459  PROBNP 756.4*   CBG:  Recent Labs Lab 09/17/13 1614  GLUCAP 110*       Signed:  Karen Kitchens [phone]  [facility] 09/23/2013, 3:29 PM

## 2013-09-23 NOTE — Progress Notes (Signed)
JP output not high but some bile tinge so will leave it in. Plan to remove at F/U. OK to D/C. I also spoke to his wife. We will arrange F/U with Dr. Brantley Stage. Patient examined and I agree with the assessment and plan  Georganna Skeans, MD, MPH, FACS Trauma: [phone] General Surgery: [phone]  09/23/2013 2:59 PM

## 2013-09-23 NOTE — Discharge Summary (Signed)
Addendum  Patient seen and examined, chart and data base reviewed.  I agree with the above assessment and plan.  For full details please see Mrs. Imogene Burn PA note.  E-coli bacteremia and cholangitis, improving.  Discharged home with the JP darin to follow with Gen. Surgery, High dose Cipro for total of 14 days.   Birdie Hopes, MD [facility] Pager: [phone] 09/23/2013, 3:34 PM

## 2013-09-23 NOTE — Progress Notes (Signed)
Patient ID: [name], male   DOB: 1941/03/23, 73 y.o.   MRN: [id number]  Subjective: Had a large BM this morning, tolerating a diet.  sitting up in a chair.  LFTs are trending down.  He remains afebrile.  Normal WBC  Objective:  Vital signs:  Filed Vitals:   09/23/13 0529 09/23/13 0609 09/23/13 0649 09/23/13 0857  BP: 181/95 199/84 145/79   Pulse: 83 88 91 84  Temp: 98.3 F (36.8 C)     TempSrc: Oral     Resp: 20     Height:      Weight:      SpO2: 96%       Last BM Date: 09/22/13  Intake/Output   Yesterday:  03/22 0701 - 03/23 0700 In: 250 [IV Piggyback:250] Out: 20 [Urine:500; Drains:15] This shift:  Total I/O In: 3 [I.V.:3] Out: 20 [Drains:20]  Physical Exam:  General: Pt awake/alert/oriented x3 in no acute distress  Abdomen: Soft. Non distended.  Mildly tender at incisions only. RUQ JP drain with serosanguinous, ? Brown tinged output. No evidence of peritonitis. No incarcerated hernias    Problem List:   Principal Problem:   Sepsis, Gram negative Active Problems:   GERD   Fatigue   Cholecystitis   Acute febrile illness   CAD (coronary artery disease)   Ascending cholangitis    Results:   Labs: Results for orders placed during the hospital encounter of 09/17/13 (from the past 31 hour(s))  COMPREHENSIVE METABOLIC PANEL     Status: Abnormal   Collection Time    09/22/13  6:40 AM      Result Value Ref Range   Sodium 139  137 - 147 mEq/L   Potassium 4.6  3.7 - 5.3 mEq/L   Comment: HEMOLYSIS AT THIS LEVEL MAY AFFECT RESULT   Chloride 101  96 - 112 mEq/L   CO2 23  19 - 32 mEq/L   Glucose, Bld 106 (*) 70 - 99 mg/dL   BUN 21  6 - 23 mg/dL   Creatinine, Ser 0.83  0.50 - 1.35 mg/dL   Calcium 8.8  8.4 - 10.5 mg/dL   Total Protein 6.3  6.0 - 8.3 g/dL   Albumin 2.6 (*) 3.5 - 5.2 g/dL   AST 37  0 - 37 U/L   Comment: HEMOLYSIS AT THIS LEVEL MAY AFFECT RESULT   ALT 47  0 - 53 U/L   Comment: HEMOLYSIS AT THIS LEVEL MAY AFFECT RESULT   Alkaline  Phosphatase 137 (*) 39 - 117 U/L   Comment: HEMOLYSIS AT THIS LEVEL MAY AFFECT RESULT   Total Bilirubin 1.8 (*) 0.3 - 1.2 mg/dL   GFR calc non Af Amer 85 (*) >90 mL/min   GFR calc Af Amer >90  >90 mL/min   Comment: (NOTE)     The eGFR has been calculated using the CKD EPI equation.     This calculation has not been validated in all clinical situations.     eGFR's persistently <90 mL/min signify possible Chronic Kidney     Disease.  LIPASE, BLOOD     Status: None   Collection Time    09/22/13  6:40 AM      Result Value Ref Range   Lipase 23  11 - 59 U/L  CBC WITH DIFFERENTIAL     Status: Abnormal   Collection Time    09/22/13  6:40 AM      Result Value Ref Range   WBC 7.3  4.0 - 10.5  K/uL   RBC 4.32  4.22 - 5.81 MIL/uL   Hemoglobin 12.7 (*) 13.0 - 17.0 g/dL   HCT 38.1 (*) 39.0 - 52.0 %   MCV 88.2  78.0 - 100.0 fL   MCH 29.4  26.0 - 34.0 pg   MCHC 33.3  30.0 - 36.0 g/dL   RDW 14.1  11.5 - 15.5 %   Platelets 125 (*) 150 - 400 K/uL   Neutrophils Relative % 76  43 - 77 %   Neutro Abs 5.5  1.7 - 7.7 K/uL   Lymphocytes Relative 11 (*) 12 - 46 %   Lymphs Abs 0.8  0.7 - 4.0 K/uL   Monocytes Relative 11  3 - 12 %   Monocytes Absolute 0.8  0.1 - 1.0 K/uL   Eosinophils Relative 2  0 - 5 %   Eosinophils Absolute 0.1  0.0 - 0.7 K/uL   Basophils Relative 0  0 - 1 %   Basophils Absolute 0.0  0.0 - 0.1 K/uL  COMPREHENSIVE METABOLIC PANEL     Status: Abnormal   Collection Time    09/23/13  7:21 AM      Result Value Ref Range   Sodium 141  137 - 147 mEq/L   Potassium 3.9  3.7 - 5.3 mEq/L   Chloride 102  96 - 112 mEq/L   CO2 25  19 - 32 mEq/L   Glucose, Bld 120 (*) 70 - 99 mg/dL   BUN 18  6 - 23 mg/dL   Creatinine, Ser 0.80  0.50 - 1.35 mg/dL   Calcium 8.9  8.4 - 10.5 mg/dL   Total Protein 6.4  6.0 - 8.3 g/dL   Albumin 2.8 (*) 3.5 - 5.2 g/dL   AST 17  0 - 37 U/L   ALT 34  0 - 53 U/L   Alkaline Phosphatase 151 (*) 39 - 117 U/L   Total Bilirubin 1.5 (*) 0.3 - 1.2 mg/dL   GFR calc  non Af Amer 86 (*) >90 mL/min   GFR calc Af Amer >90  >90 mL/min   Comment: (NOTE)     The eGFR has been calculated using the CKD EPI equation.     This calculation has not been validated in all clinical situations.     eGFR's persistently <90 mL/min signify possible Chronic Kidney     Disease.    Imaging / Studies: Dg Chest 2 View  09/21/2013   CLINICAL DATA:  Postop from cholecystectomy.  Productive cough.  EXAM: CHEST  2 VIEW  COMPARISON:  09/17/2013  FINDINGS: Lordotic position noted. The heart size and mediastinal contours are within normal limits. Both lungs are clear. No evidence of pneumothorax or pleural effusion.  IMPRESSION: No active disease.   Electronically Signed   By: Earle Gell M.D.   On: 09/21/2013 18:33    Medications / Allergies: per chart  Antibiotics: Anti-infectives   Start     Dose/Rate Route Frequency Ordered Stop   09/20/13 0600  ceFAZolin (ANCEF) IVPB 2 g/50 mL premix     2 g 100 mL/hr over 30 Minutes Intravenous On call to O.R. 09/19/13 1027 09/20/13 0950   09/18/13 0400  vancomycin (VANCOCIN) IVPB 1000 mg/200 mL premix  Status:  Discontinued     1,000 mg 200 mL/hr over 60 Minutes Intravenous Every 12 hours 09/17/13 1557 09/18/13 1324   09/17/13 2330  piperacillin-tazobactam (ZOSYN) IVPB 3.375 g  Status:  Discontinued     3.375 g 12.5 mL/hr over 240  Minutes Intravenous Every 8 hours 09/17/13 1557 09/17/13 2016   09/17/13 2200  ceFEPIme (MAXIPIME) 2 g in dextrose 5 % 50 mL IVPB     2 g 100 mL/hr over 30 Minutes Intravenous Every 12 hours 09/17/13 2016     09/17/13 1515  piperacillin-tazobactam (ZOSYN) IVPB 3.375 g     3.375 g 100 mL/hr over 30 Minutes Intravenous  Once 09/17/13 1505 09/17/13 1625   09/17/13 1515  vancomycin (VANCOCIN) IVPB 1000 mg/200 mL premix  Status:  Discontinued     1,000 mg 200 mL/hr over 60 Minutes Intravenous  Once 09/17/13 1505 09/17/13 1510   09/17/13 1515  vancomycin (VANCOCIN) 2,000 mg in sodium chloride 0.9 % 500 mL IVPB      2,000 mg 250 mL/hr over 120 Minutes Intravenous  Once 09/17/13 1510 09/17/13 1823      Assessment/Plan  Cholecystitis s/p perc chole drain s/p lap chole with IOC---Dr. Brantley Stage 3/20  Bacteremia--e coli per blood cultures, repeat [facility] 3/19 negative to date  transaminitis  POD#3 -tolerating diet, had a BM this AM, pain well controlled with oral pain meds.  My only concern is brown tinged JP Drain output.  I will have Dr. Grandville Silos evaluate before discharge.  If okay with DC drain -c/w miralax PRN -MOBILIZE  -IS/flutter valve  -antibiotics per primary service   Erby Pian, [facility] Pager [phone] Office [phone]  09/23/2013  10:38 AM

## 2013-09-23 NOTE — Progress Notes (Signed)
Patient d/c per MD. D/C instructions explained to patient and wife. Verbalized understanding to follow up with surgeon and PCP. Agreed to pick up prescriptions at preferred pharmacy of choice. NSL removed with catheter intact. Patient escorted out via wheelchair by nursing student. Noreene Filbert, Student-RN

## 2013-09-23 NOTE — Care Management Note (Signed)
    Page 1 of 1   09/23/2013     4:18:50 PM   CARE MANAGEMENT NOTE 09/23/2013  Patient:  [name], [name]   Account Number:  [id number]  Date Initiated:  09/18/2013  Documentation initiated by:  Marvetta Gibbons  Subjective/Objective Assessment:   Pt admitted with sepsis- has perc. drain in place for gall bladder     Action/Plan:   PTA pt lived at home with spouse- NCM to follow for d/c needs   Anticipated DC Date:  09/23/2013   Anticipated DC Plan:  [place]  CM consult      [facility] Choice  HOME HEALTH   Choice offered to / List presented to:  C-3 Spouse        HH arranged  HH-2 PT      [place].   Status of service:  Completed, signed off Medicare Important Message given?   (If response is "NO", the following Medicare IM given date fields will be blank) Date Medicare IM given:   Date Additional Medicare IM given:    Discharge Disposition:  [place]  Per UR Regulation:  Reviewed for med. necessity/level of care/duration of stay  If discussed at [place] of Stay Meetings, dates discussed:    Comments:  09/23/13 [place], BSN [phone] patient lives with spouse, patient for dc today, NCM spoke with [place], she chose [facility] for hhpt, referral made to [facility] , [place] notified. Soc will begin 24-48 hrs post dc. RN will show patient and wife how to empty drains before dc

## 2013-09-24 ENCOUNTER — Ambulatory Visit: Payer: Medicare Other | Admitting: Physician Assistant

## 2013-09-24 ENCOUNTER — Encounter (HOSPITAL_COMMUNITY): Payer: Self-pay | Admitting: Surgery

## 2013-09-25 ENCOUNTER — Telehealth (INDEPENDENT_AMBULATORY_CARE_PROVIDER_SITE_OTHER): Payer: Self-pay | Admitting: General Surgery

## 2013-09-25 LAB — CULTURE, BLOOD (ROUTINE X 2)
Culture: NO GROWTH
Culture: NO GROWTH

## 2013-09-25 NOTE — Telephone Encounter (Signed)
Amy, physical therapist with [facility], called to ask (for pt) if okay to change the bandage around his JP drain.  Explained this is okay to do.  Wash area with soap and water, pat dry and apply new bandage.  Instruct pt to continue to empty and measure drain output; bring to appt on Monday.

## 2013-09-30 ENCOUNTER — Ambulatory Visit (INDEPENDENT_AMBULATORY_CARE_PROVIDER_SITE_OTHER): Payer: Medicare Other | Admitting: Surgery

## 2013-09-30 ENCOUNTER — Other Ambulatory Visit: Payer: Self-pay | Admitting: *Deleted

## 2013-09-30 ENCOUNTER — Encounter (INDEPENDENT_AMBULATORY_CARE_PROVIDER_SITE_OTHER): Payer: Self-pay | Admitting: Surgery

## 2013-09-30 VITALS — BP 120/80 | HR 75 | Temp 98.4°F | Resp 14 | Ht 69.0 in | Wt 180.2 lb

## 2013-09-30 DIAGNOSIS — Z9889 Other specified postprocedural states: Secondary | ICD-10-CM

## 2013-09-30 MED ORDER — PRAVASTATIN SODIUM 40 MG PO TABS: 20.0000 mg | ORAL_TABLET | Freq: Every evening | ORAL | Status: AC

## 2013-09-30 NOTE — Patient Instructions (Signed)
Resume full activity 1 week.

## 2013-09-30 NOTE — Progress Notes (Signed)
He is here for a postop visit following laparoscopic cholecystectomy.  Diet is being tolerated, bowels are moving.  No problems with incisions. Pt was admitted for dehydration prior to that.  Pt has drain in place.  CAse done on DOW.   PE:  ABD:  Soft, incisions clean/dry/intact and solid.DRAIN REMOVED.   Assessment:  Doing well postop.  Plan:  Lowfat diet recommended.  Activities as tolerated.  Return visit prn.

## 2013-10-04 ENCOUNTER — Encounter: Payer: Self-pay | Admitting: Physician Assistant

## 2013-10-04 ENCOUNTER — Ambulatory Visit (INDEPENDENT_AMBULATORY_CARE_PROVIDER_SITE_OTHER): Payer: Medicare Other | Admitting: Physician Assistant

## 2013-10-04 VITALS — BP 141/77 | HR 83 | Ht 69.0 in | Wt 183.0 lb

## 2013-10-04 DIAGNOSIS — E785 Hyperlipidemia, unspecified: Secondary | ICD-10-CM | POA: Diagnosis not present

## 2013-10-04 DIAGNOSIS — I70219 Atherosclerosis of native arteries of extremities with intermittent claudication, unspecified extremity: Secondary | ICD-10-CM

## 2013-10-04 DIAGNOSIS — I10 Essential (primary) hypertension: Secondary | ICD-10-CM | POA: Diagnosis not present

## 2013-10-04 DIAGNOSIS — I2510 Atherosclerotic heart disease of native coronary artery without angina pectoris: Secondary | ICD-10-CM

## 2013-10-04 NOTE — Patient Instructions (Signed)
Your physician recommends that you schedule a follow-up appointment in: keep appt with Nishan on 11/20/13  Your physician recommends that you continue on your current medications as directed. Please refer to the Current Medication list given to you today.

## 2013-10-04 NOTE — Progress Notes (Signed)
[address], [place], [place]  [address] Phone: [phone] Fax:  [phone]  Date:  10/04/2013   ID:  [name], DOB 20-Sep-1940, MRN [id number]  PCP:  [name], MD  Cardiologist:  Dr. [name]     History of Present Illness: [name] is a 73 y.o. male with a history of PAD, HTN, HL. He was admitted in 07/2013 with acute cholecystitis. He was treated with percutaneous cholecystotomy drain. He was seen by Dr. Johnsie Cancel in 08/2013 for preoperative risk assessment. Stress test was abnormal with inferior ischemia. He was referred for cardiac catheterization.  LHC demonstrated significant disease in OM2 and distal L PDA.  PDA was likely responsible for abnormal nuclear study.  Small PDA and OM would need DES that would only postpone surgery.  Recommendation was to continue medical Rx and proceed with GB surgery.    Patient was admitted to 3/17-3/23. He actually presented with Escherichia coli bacteremia/sepsis. He was resuscitated with IV fluids and IV antibiotics. He ultimately underwent cholecystectomy.  There were no reported cardiac complications.  He is over all doing well. He reports chronic dyspnea with exertion. He is NYHA class 2-2b. He denies any significant change. He denies orthopnea, PND. He has chronic LE edema without change. He denies syncope. He denies chest pain.  Studies:  - LHC (09/11/13):  prox and mid LAD 20-30, prox CFX 30, ostial OM2 90 (large vessel), L PDA 70-80, dist RCA (small, non-dom), EF 60%.    - Nuclear (09/05/13):  Inf ischemia, EF 58%;  High Risk.   Recent Labs: 09/17/2013: Pro B Natriuretic peptide (BNP) 756.4*  09/22/2013: Hemoglobin 12.7*  09/23/2013: ALT 34; Creatinine 0.80; Potassium 3.9   Wt Readings from Last 3 Encounters:  10/04/13 183 lb (83.008 kg)  09/30/13 180 lb 3.2 oz (81.738 kg)  09/22/13 190 lb 14.4 oz (86.592 kg)     Past Medical History  Diagnosis Date  . Hyperlipidemia   . Hypertension   . OAD  (obstructive airway disease)   . GERD (gastroesophageal reflux disease)   . Diverticulosis   . ED (erectile dysfunction)     no relief with cialis, etc  . Back pain     per [facility]  . Thyroid nodule   . Anemia   . Irregular heartbeat   . Peripheral vascular disease   . Bronchitis   . AAA (abdominal aortic aneurysm)   . Emphysema     "has a little bit" (07/16/2013)  . Pneumonia     "twice since 1959; once when he was a child" (07/16/2013)  . Exertional shortness of breath   . DDD (degenerative disc disease)     cervical, followed by [facility] 2012    Current Outpatient Prescriptions  Medication Sig Dispense Refill  . aspirin 81 MG chewable tablet Chew 1 tablet (81 mg total) by mouth daily.      . carvedilol (COREG) 6.25 MG tablet Take 1 tablet (6.25 mg total) by mouth 2 (two) times daily.  180 tablet  3  . clopidogrel (PLAVIX) 75 MG tablet Take 75 mg by mouth every evening.      . finasteride (PROSCAR) 5 MG tablet Take 5 mg by mouth every evening.       . isosorbide mononitrate (IMDUR) 30 MG 24 hr tablet Take 1 tablet (30 mg total) by mouth daily.  90 tablet  3  . omeprazole (PRILOSEC) 20 MG capsule Take 20 mg by mouth every morning.      Marland Kitchen  pravastatin (PRAVACHOL) 40 MG tablet Take 0.5 tablets (20 mg total) by mouth every evening. Takes half tab daily  45 tablet  3  . tamsulosin (FLOMAX) 0.4 MG CAPS capsule Take 0.4 mg by mouth daily after supper.      . VENTOLIN HFA 108 (90 BASE) MCG/ACT inhaler        No current facility-administered medications for this visit.    Allergies:   Hydrochlorothiazide and Benazepril hcl   Social History:  The patient  reports that he quit smoking about 31 years ago. His smoking use included Cigarettes. He has a 90 pack-year smoking history. He has never used smokeless tobacco. He reports that he drinks alcohol. He reports that he does not use illicit drugs.   Family History:  The patient's family history includes Breast cancer in his sister; Cancer in his  brother and sister; Diabetes in his sister; Heart attack in his brother, brother, brother, father, and mother; Heart disease in his brother, brother, father, and mother; Hyperlipidemia in his brother, brother, father, and mother; Hypertension in his brother and father; Other in his brother, brother, and father; Prostate cancer in his brother; Stroke in his father.   ROS:  Please see the history of present illness.      All other systems reviewed and negative.   PHYSICAL EXAM: VS:  BP 141/77  Pulse 83  Ht 5\' 9"  (1.753 m)  Wt 183 lb (83.008 kg)  BMI 27.01 kg/m2 Well nourished, well developed, in no acute distress HEENT: normal Neck: no JVD Cardiac:  normal S1, S2; RRR; no murmur Lungs:  Decreased breath sounds bilaterally, no wheezing, rhonchi or rales Abd: soft, nontender, no hepatomegaly Ext: no edema Skin: warm and dry Neuro:  CNs 2-12 intact, no focal abnormalities noted  EKG:  NSR, HR 83, normal axis, NSSTTW changes     ASSESSMENT AND PLAN:  1. CAD: He denies any symptoms consistent with angina. He has chronic dyspnea on exertion without significant change.  Continue medical therapy with aspirin, Plavix, beta blocker, nitrates, statin. We discussed symptoms that would suggest angina. He knows to return sooner if symptoms begin. 2. Hypertension: Controlled. Continue current therapy. 3. Hyperlipidemia: Continue statin. 4. Gallbladder Disease: Follow up with surgery as planned. 5. Disposition: Follow up with Dr. Johnsie Cancel in 6-8 weeks.  Signed, Richardson Dopp, PA-C  10/04/2013 11:47 AM

## 2013-10-08 ENCOUNTER — Ambulatory Visit: Payer: Medicare Other | Admitting: Family Medicine

## 2013-10-11 ENCOUNTER — Encounter: Payer: Self-pay | Admitting: Family Medicine

## 2013-10-11 ENCOUNTER — Ambulatory Visit (INDEPENDENT_AMBULATORY_CARE_PROVIDER_SITE_OTHER): Payer: Medicare Other | Admitting: Family Medicine

## 2013-10-11 VITALS — BP 132/82 | HR 80 | Temp 97.4°F | Wt 181.5 lb

## 2013-10-11 DIAGNOSIS — K8309 Other cholangitis: Secondary | ICD-10-CM

## 2013-10-11 DIAGNOSIS — I70219 Atherosclerosis of native arteries of extremities with intermittent claudication, unspecified extremity: Secondary | ICD-10-CM

## 2013-10-11 DIAGNOSIS — Z8719 Personal history of other diseases of the digestive system: Secondary | ICD-10-CM | POA: Diagnosis not present

## 2013-10-11 LAB — COMPREHENSIVE METABOLIC PANEL
ALT: 17 U/L (ref 0–53)
AST: 16 U/L (ref 0–37)
Albumin: 3.8 g/dL (ref 3.5–5.2)
Alkaline Phosphatase: 74 U/L (ref 39–117)
BILIRUBIN TOTAL: 1.3 mg/dL — AB (ref 0.3–1.2)
BUN: 13 mg/dL (ref 6–23)
CALCIUM: 9.4 mg/dL (ref 8.4–10.5)
CHLORIDE: 105 meq/L (ref 96–112)
CO2: 31 meq/L (ref 19–32)
CREATININE: 1 mg/dL (ref 0.4–1.5)
GFR: 75 mL/min (ref >60.00)
Glucose, Bld: 90 mg/dL (ref 70–99)
Potassium: 4 mEq/L (ref 3.5–5.1)
SODIUM: 140 meq/L (ref 135–145)
Total Protein: 7.1 g/dL (ref 6.0–8.3)

## 2013-10-11 LAB — CBC WITH DIFFERENTIAL/PLATELET
BASOS ABS: 0 10*3/uL (ref 0.0–0.1)
Basophils Relative: 0.8 % (ref 0.0–3.0)
EOS ABS: 0.2 10*3/uL (ref 0.0–0.7)
Eosinophils Relative: 4.1 % (ref 0.0–5.0)
HEMATOCRIT: 39.3 % (ref 39.0–52.0)
Hemoglobin: 13.3 g/dL (ref 13.0–17.0)
Lymphocytes Relative: 21.5 % (ref 12.0–46.0)
Lymphs Abs: 1.1 10*3/uL (ref 0.7–4.0)
MCHC: 33.8 g/dL (ref 30.0–36.0)
MCV: 85.6 fl (ref 78.0–100.0)
Monocytes Absolute: 0.5 10*3/uL (ref 0.1–1.0)
Monocytes Relative: 10.4 % (ref 3.0–12.0)
NEUTROS ABS: 3.2 10*3/uL (ref 1.4–7.7)
Neutrophils Relative %: 63.2 % (ref 43.0–77.0)
Platelets: 171 10*3/uL (ref 150.0–400.0)
RBC: 4.59 Mil/uL (ref 4.22–5.81)
RDW: 14.6 % (ref 11.5–14.6)
WBC: 5 10*3/uL (ref 4.5–10.5)

## 2013-10-11 NOTE — Patient Instructions (Addendum)
Look at heart.org for low fat recipe ideas. You look great.   Take care. Go to the lab on the way out.  We'll contact you with your lab report. Recheck in the fall of 2015.  Glad to see you.

## 2013-10-11 NOTE — Progress Notes (Signed)
Pre visit review using our clinic review tool, if applicable. No additional management support is needed unless otherwise documented below in the visit note.  Hospital f/u.   Admitted with Sepsis  Secondary to E-coli bacteremia  Patient stabilized on IV fluids and antibiotic  Acute sepsis resolved.   Ecoli bacteremia  2 of 2 blood cultures are positive for ecoli from 3/17.  Treated inpatient with IV Cefepime.   Cholecystitis with acute cholangitis  Patient had a cholecystostomy tube placed on 07/17/2013 as he was considered not appropriate for surgery at that time.  Patient undergone cholecystectomy 3/20, drain in place. Drain removed at [facility] follow up on 09/30/13  CBD had no stones on cholangiogram 3/18   Procedures:  Sepsis  Secondary to E-coli bacteremia  Patient now stabilized on IV fluids and antibiotic  Acute sepsis resolved.  Ecoli bacteremia  2 of 2 blood cultures are positive for ecoli from 3/17.  Being treated with IV Cefepime. Will need 14 days of antibiotic therapy. Will be discharged on oral Cipro.  Second set of Bld Cx are negative to date.  Cholecystitis with acute cholangitis  Patient had a cholecystostomy tube placed on 07/17/2013 as he was considered not appropriate for surgery at that time.  Patient undergone cholecystectomy 3/20, drain in place. Drain to be removed at [facility] follow up on 09/30/13  Appreciate GI consultation. CBD had no stones on cholangiogram 3/18  Will continue with antibiotic therapy.  CAD  Restart aspirin and Plavix.  Emphysema  Currently stable and asymptomatic  HTN  Coreg continued. Imdur resumed at discharge.  HLD  GERD  On protonix  Procedures:  Cholecystectomy with drain placement and IOC. with drain placement and IOC.  Now back to feeling well but still somewhat weak. D/w pt that he is likely "ahead of schedule" given his illness.  Inc in appetite, no fevers, no abd pain, no diarrhea.  D/w pt about diet given h/o cholecystectomy and  vascular disease.    Meds, vitals, and allergies reviewed.   ROS: See HPI.  Otherwise, noncontributory.  GEN: nad, alert and oriented HEENT: mucous membranes moist NECK: supple w/o LA CV: rrr.  PULM: ctab, no inc wob ABD: soft, +bs, drain site healing well, CDI EXT: no edema SKIN: no acute rash

## 2013-10-13 DIAGNOSIS — A419 Sepsis, unspecified organism: Secondary | ICD-10-CM | POA: Diagnosis not present

## 2013-10-13 DIAGNOSIS — Z48814 Encounter for surgical aftercare following surgery on the teeth or oral cavity: Secondary | ICD-10-CM

## 2013-10-13 DIAGNOSIS — IMO0001 Reserved for inherently not codable concepts without codable children: Secondary | ICD-10-CM | POA: Diagnosis not present

## 2013-10-13 DIAGNOSIS — I2510 Atherosclerotic heart disease of native coronary artery without angina pectoris: Secondary | ICD-10-CM | POA: Diagnosis not present

## 2013-10-13 DIAGNOSIS — Z48815 Encounter for surgical aftercare following surgery on the digestive system: Secondary | ICD-10-CM | POA: Diagnosis not present

## 2013-10-13 NOTE — Assessment & Plan Note (Signed)
Now back to feeling well but still somewhat weak. D/w pt that he is likely "ahead of schedule" given his illness. Inc in appetite, no fevers, no abd pain, no diarrhea. D/w pt about diet given h/o cholecystectomy and vascular disease.  See notes on labs, all improved.

## 2013-10-16 ENCOUNTER — Ambulatory Visit: Admit: 2013-10-16 | Payer: Medicare Other | Admitting: Surgery

## 2013-10-21 ENCOUNTER — Other Ambulatory Visit: Payer: Self-pay | Admitting: *Deleted

## 2013-10-21 MED ORDER — OMEPRAZOLE 20 MG PO CPDR: 20.0000 mg | DELAYED_RELEASE_CAPSULE | Freq: Every day | ORAL | Status: DC

## 2013-11-13 ENCOUNTER — Other Ambulatory Visit: Payer: Self-pay | Admitting: Vascular Surgery

## 2013-11-13 DIAGNOSIS — Z48812 Encounter for surgical aftercare following surgery on the circulatory system: Secondary | ICD-10-CM

## 2013-11-13 DIAGNOSIS — I739 Peripheral vascular disease, unspecified: Secondary | ICD-10-CM

## 2013-11-20 ENCOUNTER — Ambulatory Visit (INDEPENDENT_AMBULATORY_CARE_PROVIDER_SITE_OTHER): Payer: Medicare Other | Admitting: Cardiovascular Disease

## 2013-11-20 VITALS — BP 152/82 | HR 80 | Ht 69.0 in | Wt 187.2 lb

## 2013-11-20 DIAGNOSIS — I70219 Atherosclerosis of native arteries of extremities with intermittent claudication, unspecified extremity: Secondary | ICD-10-CM

## 2013-11-20 DIAGNOSIS — I2510 Atherosclerotic heart disease of native coronary artery without angina pectoris: Secondary | ICD-10-CM | POA: Diagnosis not present

## 2013-11-20 DIAGNOSIS — E785 Hyperlipidemia, unspecified: Secondary | ICD-10-CM | POA: Diagnosis not present

## 2013-11-20 DIAGNOSIS — K819 Cholecystitis, unspecified: Secondary | ICD-10-CM | POA: Diagnosis not present

## 2013-11-20 MED ORDER — NITROGLYCERIN 0.4 MG SL SUBL: 0.4000 mg | SUBLINGUAL_TABLET | SUBLINGUAL | Status: AC | PRN

## 2013-11-20 NOTE — Assessment & Plan Note (Signed)
Cholesterol is at goal.  Continue current dose of statin and diet Rx.  No myalgias or side effects.  F/U  LFT's in 6 months. Lab Results  Component Value Date   LDLCALC 69 09/24/2012

## 2013-11-20 NOTE — Assessment & Plan Note (Signed)
Stable with no angina and good activity level.  Continue medical Rx Nitro called into [facility] in [place]

## 2013-11-20 NOTE — Assessment & Plan Note (Signed)
S/P lap choly with improved GI symptoms  F/U general surgery

## 2013-11-20 NOTE — Patient Instructions (Signed)
Your physician wants you to follow-up in:  6 MONTHS WITH DR NISHAN  You will receive a reminder letter in the mail two months in advance. If you don't receive a letter, please call our office to schedule the follow-up appointment. Your physician recommends that you continue on your current medications as directed. Please refer to the Current Medication list given to you today. 

## 2013-11-20 NOTE — Progress Notes (Signed)
Patient ID: [name], male   DOB: 1940-11-26, 73 y.o.   MRN: [id number] [name] is a 73 y.o. male with a history of PAD, HTN, HL. He was admitted in 07/2013 with acute cholecystitis. He was treated with percutaneous cholecystotomy drain. He was seen by Dr. Johnsie Cancel in 08/2013 for preoperative risk assessment. Stress test was abnormal with inferior ischemia. He was referred for cardiac catheterization. LHC demonstrated significant disease in OM2 and distal L PDA. PDA was likely responsible for abnormal nuclear study. Small PDA and OM would need DES that would only postpone surgery. Recommendation was to continue medical Rx and proceed with GB surgery.  Patient was admitted to 3/17-3/23. He actually presented with Escherichia coli bacteremia/sepsis. He was resuscitated with IV fluids and IV antibiotics. He ultimately underwent cholecystectomy. There were no reported cardiac complications.  He is over all doing well. He reports chronic dyspnea with exertion. He is NYHA class 2-2b. He denies any significant change. He denies orthopnea, PND. He has chronic LE edema without change. He denies syncope. He denies chest pain.  Studies:  - LHC (09/11/13): prox and mid LAD 20-30, prox CFX 30, ostial OM2 90 (large vessel), L PDA 70-80, dist RCA (small, non-dom), EF 60%.  - Nuclear (09/05/13): Inf ischemia, EF 58%; High Risk.    ROS: Denies fever, malais, weight loss, blurry vision, decreased visual acuity, cough, sputum, SOB, hemoptysis, pleuritic pain, palpitaitons, heartburn, abdominal pain, melena, lower extremity edema, claudication, or rash.  All other systems reviewed and negative  General: Affect appropriate Healthy:  appears stated age 73: normal Neck supple with no adenopathy JVP normal no bruits no thyromegaly Lungs clear with no wheezing and good diaphragmatic motion Heart:  S1/S2 no murmur, no rub, gallop or click PMI normal Abdomen: benighn, BS positve, no tenderness, no AAA  S/P lap  choly no bruit.  No HSM or HJR Distal pulses intact with no bruits No edema Neuro non-focal Skin warm and dry No muscular weakness   Current Outpatient Prescriptions  Medication Sig Dispense Refill  . aspirin 81 MG chewable tablet Chew 1 tablet (81 mg total) by mouth daily.      . carvedilol (COREG) 6.25 MG tablet Take 1 tablet (6.25 mg total) by mouth 2 (two) times daily.  180 tablet  3  . clopidogrel (PLAVIX) 75 MG tablet Take 75 mg by mouth every evening.      . finasteride (PROSCAR) 5 MG tablet Take 5 mg by mouth every evening.       . isosorbide mononitrate (IMDUR) 30 MG 24 hr tablet Take 1 tablet (30 mg total) by mouth daily.  90 tablet  3  . omeprazole (PRILOSEC) 20 MG capsule Take 1 capsule (20 mg total) by mouth daily.  90 capsule  1  . pravastatin (PRAVACHOL) 40 MG tablet Take 0.5 tablets (20 mg total) by mouth every evening. Takes half tab daily  45 tablet  3  . tamsulosin (FLOMAX) 0.4 MG CAPS capsule Take 0.4 mg by mouth daily after supper.      . VENTOLIN HFA 108 (90 BASE) MCG/ACT inhaler        No current facility-administered medications for this visit.    Allergies  Hydrochlorothiazide and Benazepril hcl  Electrocardiogram:  SR rate 83 normal   Assessment and Plan

## 2013-11-21 ENCOUNTER — Encounter: Payer: Self-pay | Admitting: Vascular Surgery

## 2013-11-22 ENCOUNTER — Ambulatory Visit (HOSPITAL_COMMUNITY)
Admission: RE | Admit: 2013-11-22 | Discharge: 2013-11-22 | Disposition: A | Discharge status: Home / Self Care | Payer: Medicare Other | Source: Ambulatory Visit | Attending: Vascular Surgery | Admitting: Vascular Surgery

## 2013-11-22 ENCOUNTER — Ambulatory Visit (INDEPENDENT_AMBULATORY_CARE_PROVIDER_SITE_OTHER)
Admission: RE | Admit: 2013-11-22 | Discharge: 2013-11-22 | Disposition: A | Discharge status: Home / Self Care | Payer: Medicare Other | Source: Ambulatory Visit | Attending: Vascular Surgery | Admitting: Vascular Surgery

## 2013-11-22 ENCOUNTER — Encounter: Payer: Self-pay | Admitting: Vascular Surgery

## 2013-11-22 ENCOUNTER — Ambulatory Visit (INDEPENDENT_AMBULATORY_CARE_PROVIDER_SITE_OTHER): Payer: Medicare Other | Admitting: Vascular Surgery

## 2013-11-22 VITALS — BP 141/89 | HR 80 | Resp 18 | Ht 69.0 in | Wt 187.0 lb

## 2013-11-22 DIAGNOSIS — Z48812 Encounter for surgical aftercare following surgery on the circulatory system: Secondary | ICD-10-CM

## 2013-11-22 DIAGNOSIS — I70219 Atherosclerosis of native arteries of extremities with intermittent claudication, unspecified extremity: Secondary | ICD-10-CM | POA: Diagnosis not present

## 2013-11-22 DIAGNOSIS — I739 Peripheral vascular disease, unspecified: Secondary | ICD-10-CM | POA: Insufficient documentation

## 2013-11-22 NOTE — Progress Notes (Signed)
    Established Intermittent Claudication  History of Present Illness  [name] is a 73 y.o. (1941-03-16) male who presents with chief complaint: B thigh pain.  The patient's symptoms have not progressed.  The patient's symptoms are: thigh and leg sx more consistent with osteoarthritis.  The patient's treatment regimen currently included: maximal medical management.  The patient has limited ambulation.  The patient's PMH, PSH, SH, FamHx, Med, and Allergies are unchanged from 03/22/13.  On ROS today: no rest pain, no ulcers or gangrene  Physical Examination  Filed Vitals:   11/22/13 0957  BP: 141/89  Pulse: 80  Resp: 18  Height: 5\' 9"  (1.753 m)  Weight: 187 lb (84.823 kg)   Body mass index is 27.6 kg/(m^2).  Body mass index is 29.21 kg/(m^2).   General: A&O x 3, WDWN   Pulmonary: Sym exp, good air movt, CTAB, no rales, rhonchi, & wheezing   Cardiac: RRR, Nl S1, S2, no Murmurs, rubs or gallops   Vascular:  Vessel  Right  Left   Radial  Palpable  Palpable   Ulnar  Palpable  Palpable   Brachial  Palpable  Palpable   Carotid  Palpable, without bruit  Palpable, without bruit   Aorta  Not palpable due to pannus  N/A   Femoral  Palpable  Palpable   Popliteal  Not palpable  Not palpable   PT  Faintly Palpable  Faintly Palpable   DP  Faintly Palpable  Faintly Palpable    Musculoskeletal: M/S 5/5 throughout , Extremities without ischemic changes   Neurologic: Pain and light touch intact in extremities , Motor exam as listed above   Non-Invasive Vascular Imaging ABI (Date: 11/22/2013)  R: 1.14 (1.07), DP: bi, PT: bi, TBI: 0.78  L: 1.14 (0.95), DP: bi, PT: bi, TBI: 0.61  Aortoiliac Duplex (Date: 11/22/2013)  Ao: 51 c/s  R iliac: 141-216 c/s  Patent R CIA stent  Medical Decision Making  [name] is a 73 y.o. male who presents with:  Known bilateral multilevel peripheral arterial disease , likely osteoarthritis L>R  Based on the patient's vascular studies  and examination, I have offered the patient: q6 months ABI and aortoiliac duplex.  I discussed in depth with the patient the nature of atherosclerosis, and emphasized the importance of maximal medical management including strict control of blood pressure, blood glucose, and lipid levels, antiplatelet agents, obtaining regular exercise, and cessation of smoking.    The patient is aware that without maximal medical management the underlying atherosclerotic disease process will progress, limiting the benefit of any interventions. The patient is currently on a statin: Pravachol. The patient is currently on an anti-platelet: ASA and plavix. I emphasized to this patient the importance of his medical management as while he appears to be asx from his PAD, he has significant disease in both legs.  Thank you for allowing [place] to participate in this patient's care.  Adele Barthel, MD [facility] Office: [phone] Pager: [phone]  11/22/2013, 5:29 PM

## 2014-03-18 ENCOUNTER — Ambulatory Visit (INDEPENDENT_AMBULATORY_CARE_PROVIDER_SITE_OTHER): Payer: Medicare Other | Admitting: Family Medicine

## 2014-03-18 ENCOUNTER — Encounter: Payer: Self-pay | Admitting: Family Medicine

## 2014-03-18 VITALS — BP 126/82 | HR 78 | Temp 97.5°F | Wt 187.8 lb

## 2014-03-18 DIAGNOSIS — I10 Essential (primary) hypertension: Secondary | ICD-10-CM

## 2014-03-18 DIAGNOSIS — I70219 Atherosclerosis of native arteries of extremities with intermittent claudication, unspecified extremity: Secondary | ICD-10-CM

## 2014-03-18 DIAGNOSIS — Z23 Encounter for immunization: Secondary | ICD-10-CM | POA: Diagnosis not present

## 2014-03-18 NOTE — Patient Instructions (Addendum)
Take care.   Check with your insurance to see if they will cover the shingles shot. Don't change your meds.   Recheck at a physical in about 6 months.  Labs ahead of time.

## 2014-03-18 NOTE — Progress Notes (Signed)
Pre visit review using our clinic review tool, if applicable. No additional management support is needed unless otherwise documented below in the visit note.  PSA per Dr. Risa Grill.    Hypertension:    Using medication without problems or lightheadedness: yes Chest pain with exertion:no Edema: occ, trace L leg edema, at baseline Short of breath: no change  No abd pain.  He feels well overall.   Meds, vitals, and allergies reviewed.   ROS: See HPI.  Otherwise negative.    GEN: nad, alert and oriented HEENT: mucous membranes moist NECK: supple w/o LA CV: rrr. PULM: ctab, no inc wob ABD: soft, +bs EXT: trace L leg edema SKIN: no acute rash

## 2014-03-20 DIAGNOSIS — I10 Essential (primary) hypertension: Secondary | ICD-10-CM | POA: Insufficient documentation

## 2014-03-20 NOTE — Assessment & Plan Note (Signed)
Controlled, continue as is.  D/w pt.  No change in meds.  Recheck in about 6 months.  He agrees.

## 2014-04-28 ENCOUNTER — Other Ambulatory Visit: Payer: Self-pay | Admitting: *Deleted

## 2014-04-28 DIAGNOSIS — I739 Peripheral vascular disease, unspecified: Secondary | ICD-10-CM

## 2014-04-28 MED ORDER — CLOPIDOGREL BISULFATE 75 MG PO TABS: 75.0000 mg | ORAL_TABLET | Freq: Every evening | ORAL | Status: DC

## 2014-05-20 ENCOUNTER — Encounter: Payer: Self-pay | Admitting: Cardiovascular Disease

## 2014-05-20 ENCOUNTER — Ambulatory Visit (INDEPENDENT_AMBULATORY_CARE_PROVIDER_SITE_OTHER): Payer: Medicare Other | Admitting: Cardiovascular Disease

## 2014-05-20 VITALS — BP 150/80 | HR 70 | Ht 69.0 in | Wt 187.0 lb

## 2014-05-20 DIAGNOSIS — I10 Essential (primary) hypertension: Secondary | ICD-10-CM | POA: Diagnosis not present

## 2014-05-20 DIAGNOSIS — I739 Peripheral vascular disease, unspecified: Secondary | ICD-10-CM | POA: Diagnosis not present

## 2014-05-20 DIAGNOSIS — I70219 Atherosclerosis of native arteries of extremities with intermittent claudication, unspecified extremity: Secondary | ICD-10-CM

## 2014-05-20 DIAGNOSIS — I2583 Coronary atherosclerosis due to lipid rich plaque: Secondary | ICD-10-CM

## 2014-05-20 DIAGNOSIS — I2510 Atherosclerotic heart disease of native coronary artery without angina pectoris: Secondary | ICD-10-CM

## 2014-05-20 NOTE — Assessment & Plan Note (Signed)
Well controlled.  Continue current medications and low sodium Dash type diet.    

## 2014-05-20 NOTE — Progress Notes (Signed)
Patient ID: [name], male   DOB: April 11, 1941, 73 y.o.   MRN: [id number] FELTON BUCZYNSKI is a 73 y.o. male with a history of PAD, HTN, HL. He was admitted in 07/2013 with acute cholecystitis. He was treated with percutaneous cholecystotomy drain. He was seen by me  in 08/2013 for preoperative risk assessment. Stress test was abnormal with inferior ischemia. He was referred for cardiac catheterization. LHC demonstrated significant disease in OM2 and distal L PDA. PDA was likely responsible for abnormal nuclear study. Small PDA and OM would need DES that would only postpone surgery. Recommendation was to continue medical Rx and proceed with GB surgery.  Patient was admitted to 3/17-3/23. He actually presented with Escherichia coli bacteremia/sepsis. He was resuscitated with IV fluids and IV antibiotics. He ultimately underwent cholecystectomy. There were no reported cardiac complications.  He is over all doing well. He reports chronic dyspnea with exertion. He is NYHA class 2-2b. He denies any significant change. He denies orthopnea, PND. He has chronic LE edema without change. He denies syncope. He denies chest pain.  Studies:  - LHC (09/11/13): prox and mid LAD 20-30, prox CFX 30, ostial OM2 90 (large vessel), L PDA 70-80, dist RCA (small, non-dom), EF 60%.  - Nuclear (09/05/13): Inf ischemia, EF 58%; High Risk.  He complains of LLE claudication getting worse I believe he has seen Dr Bridgett Larsson in past with R CIA stent and known residual LLE disese No chest pain Some exertional dyspnea   ROS: Denies fever, malais, weight loss, blurry vision, decreased visual acuity, cough, sputum, SOB, hemoptysis, pleuritic pain, palpitaitons, heartburn, abdominal pain, melena, lower extremity edema, claudication, or rash.  All other systems reviewed and negative  General: Affect appropriate Healthy:  appears stated age 3: normal Neck supple with no adenopathy JVP normal no bruits no thyromegaly Lungs clear with  no wheezing and good diaphragmatic motion Heart:  S1/S2 no murmur, no rub, gallop or click PMI normal Abdomen: benighn, BS positve, no tenderness, no AAA no bruit.  No HSM or HJR Distal pulses poor left greater than right  No edema Neuro non-focal Skin warm and dry No muscular weakness   Current Outpatient Prescriptions  Medication Sig Dispense Refill  . aspirin 81 MG chewable tablet Chew 1 tablet (81 mg total) by mouth daily.    . carvedilol (COREG) 6.25 MG tablet Take 1 tablet (6.25 mg total) by mouth 2 (two) times daily. 180 tablet 3  . clopidogrel (PLAVIX) 75 MG tablet Take 1 tablet (75 mg total) by mouth every evening. 30 tablet 3  . finasteride (PROSCAR) 5 MG tablet Take 5 mg by mouth every evening.     . isosorbide mononitrate (IMDUR) 30 MG 24 hr tablet Take 1 tablet (30 mg total) by mouth daily. 90 tablet 3  . nitroGLYCERIN (NITROSTAT) 0.4 MG SL tablet Place 1 tablet (0.4 mg total) under the tongue every 5 (five) minutes as needed for chest pain. 25 tablet 3  . omeprazole (PRILOSEC) 20 MG capsule Take 1 capsule (20 mg total) by mouth daily. 90 capsule 1  . pravastatin (PRAVACHOL) 40 MG tablet Take 0.5 tablets (20 mg total) by mouth every evening. Takes half tab daily 45 tablet 3  . tamsulosin (FLOMAX) 0.4 MG CAPS capsule Take 0.4 mg by mouth daily after supper.    . VENTOLIN HFA 108 (90 BASE) MCG/ACT inhaler      No current facility-administered medications for this visit.    Allergies  Hydrochlorothiazide and Benazepril hcl  Electrocardiogram:  4/30  SR rate 83 normal ECG   Assessment and Plan

## 2014-05-20 NOTE — Assessment & Plan Note (Signed)
Despite known OM disease he is not having angina on medical Rx  Initial intervention not done due to acute GB issues Continue current medical Rx

## 2014-05-20 NOTE — Assessment & Plan Note (Signed)
Previously seen by Dr Bridgett Larsson  ? 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

## 2014-05-20 NOTE — Patient Instructions (Signed)
Your physician recommends that you schedule a follow-up appointment in: 3 MONTHS WITH  DR NISHAN Your physician recommends that you continue on your current medications as directed. Please refer to the Current Medication list given to you today. Your physician has requested that you have a lower or upper extremity arterial duplex. This test is an ultrasound of the arteries in the legs or arms. It looks at arterial blood flow in the legs and arms. Allow one hour for Lower and Upper Arterial scans. There are no restrictions or special instructions WITH  ABI'S 

## 2014-05-23 ENCOUNTER — Ambulatory Visit (HOSPITAL_COMMUNITY): Payer: Medicare Other | Attending: Cardiovascular Disease | Admitting: *Deleted

## 2014-05-23 DIAGNOSIS — E785 Hyperlipidemia, unspecified: Secondary | ICD-10-CM | POA: Insufficient documentation

## 2014-05-23 DIAGNOSIS — Z87891 Personal history of nicotine dependence: Secondary | ICD-10-CM | POA: Insufficient documentation

## 2014-05-23 DIAGNOSIS — I10 Essential (primary) hypertension: Secondary | ICD-10-CM | POA: Diagnosis not present

## 2014-05-23 DIAGNOSIS — I739 Peripheral vascular disease, unspecified: Secondary | ICD-10-CM | POA: Diagnosis not present

## 2014-05-23 DIAGNOSIS — I70212 Atherosclerosis of native arteries of extremities with intermittent claudication, left leg: Secondary | ICD-10-CM | POA: Diagnosis not present

## 2014-05-23 NOTE — Progress Notes (Signed)
Lower arterial doppler completed. 

## 2014-06-02 ENCOUNTER — Other Ambulatory Visit: Payer: Self-pay | Admitting: *Deleted

## 2014-06-02 DIAGNOSIS — Z9862 Peripheral vascular angioplasty status: Secondary | ICD-10-CM

## 2014-06-03 ENCOUNTER — Other Ambulatory Visit: Payer: Self-pay | Admitting: Family Medicine

## 2014-06-05 ENCOUNTER — Encounter: Payer: Self-pay | Admitting: Family

## 2014-06-06 ENCOUNTER — Encounter: Payer: Self-pay | Admitting: Family

## 2014-06-06 ENCOUNTER — Encounter (HOSPITAL_COMMUNITY): Payer: Medicare Other

## 2014-06-06 ENCOUNTER — Ambulatory Visit (INDEPENDENT_AMBULATORY_CARE_PROVIDER_SITE_OTHER): Payer: Medicare Other | Admitting: Family

## 2014-06-06 ENCOUNTER — Ambulatory Visit (HOSPITAL_COMMUNITY)
Admission: RE | Admit: 2014-06-06 | Discharge: 2014-06-06 | Disposition: A | Discharge status: Home / Self Care | Payer: Medicare Other | Source: Ambulatory Visit | Attending: Vascular Surgery | Admitting: Vascular Surgery

## 2014-06-06 VITALS — BP 96/61 | HR 88 | Temp 97.1°F | Resp 16 | Ht 68.0 in | Wt 187.0 lb

## 2014-06-06 DIAGNOSIS — Z48812 Encounter for surgical aftercare following surgery on the circulatory system: Secondary | ICD-10-CM | POA: Diagnosis not present

## 2014-06-06 DIAGNOSIS — Z95828 Presence of other vascular implants and grafts: Secondary | ICD-10-CM | POA: Insufficient documentation

## 2014-06-06 DIAGNOSIS — Z9889 Other specified postprocedural states: Secondary | ICD-10-CM | POA: Diagnosis not present

## 2014-06-06 DIAGNOSIS — Z9862 Peripheral vascular angioplasty status: Secondary | ICD-10-CM

## 2014-06-06 DIAGNOSIS — I739 Peripheral vascular disease, unspecified: Secondary | ICD-10-CM | POA: Diagnosis not present

## 2014-06-06 NOTE — Progress Notes (Signed)
[facility] HISTORY AND PHYSICAL -PAD  History of Present Illness [name] is a 73 y.o. male patient of Dr. Bridgett Larsson who is s/p R CIA stent placement in 2013. The pt has known bilateral multilevel peripheral arterial disease. He presents with chief complaint: left calf claudication pain after walking about 300 feet, relieved with rest,  returns today for follow up. The patient's symptoms have not progressed.He denies non healing wounds.  The patient's treatment regimen currently included: maximal medical management. The patient states he has mild arthritis.    Pt Diabetic: No Pt smoker: former smoker, quit in 1983, was smoking 3 ppd for 30 years  Pt meds include: Statin :Yes Betablocker: Yes ASA: Yes Other anticoagulants/antiplatelets: Plavix  Past Medical History  Diagnosis Date  . Hyperlipidemia   . Hypertension   . OAD (obstructive airway disease)   . GERD (gastroesophageal reflux disease)   . Diverticulosis   . ED (erectile dysfunction)     no relief with cialis, etc  . Back pain     per [facility]  . Thyroid nodule   . Anemia   . Irregular heartbeat   . Peripheral vascular disease   . Bronchitis   . AAA (abdominal aortic aneurysm)   . Emphysema     "has a little bit" (07/16/2013)  . Pneumonia     "twice since 1959; once when he was a child" (07/16/2013)  . Exertional shortness of breath   . DDD (degenerative disc disease)     cervical, followed by [facility] 2012    Social History History  Substance Use Topics  . Smoking status: Former Smoker -- 3.00 packs/day for 30 years    Types: Cigarettes    Quit date: 02/28/1982  . Smokeless tobacco: Never Used  . Alcohol Use: No     Comment: 07/16/2013 "used to drink a couple beers/night; stopped  02/2012"    Family History Family History  Problem Relation Age of Onset  . Heart attack Mother   . Heart disease Mother   . Hyperlipidemia Mother   . Stroke Father   . Heart attack Father   .  Heart disease Father   . Hyperlipidemia Father   . Hypertension Father   . Other Father     varicose veins  . Heart attack Brother   . Prostate cancer Brother   . Cancer Brother     lung  . Other Brother     varicose veins  . Heart attack Brother   . Heart disease Brother   . Hyperlipidemia Brother   . Other Brother     varicose veins  . Breast cancer Sister   . Diabetes Sister   . Cancer Sister     stomach  . Heart disease Brother   . Hyperlipidemia Brother   . Hypertension Brother   . Heart attack Brother     Past Surgical History  Procedure Laterality Date  . Small intestine surgery      "blockage" (07/16/2013)  . Skin graft Left     "hand"  . Tonsillectomy    . Colonoscopy w/ polypectomy    . Aortogram  Aug. 1, 2013  . Colectomy  1961  . Iliac artery stent Right 02/2012    "Dr. Bridgett Larsson"  . Cholecystectomy N/A 09/20/2013    Procedure: LAPAROSCOPIC CHOLECYSTECTOMY WITH INTRAOPERATIVE CHOLANGIOGRAM;  Surgeon: Joyice Faster. Cornett, MD;  Location: [facility];  Service: General;  Laterality: N/A;    Allergies  Allergen Reactions  .  Hydrochlorothiazide Other (See Comments)    REACTION: joint pain  . Benazepril Hcl Other (See Comments)    unknown    Current Outpatient Prescriptions  Medication Sig Dispense Refill  . aspirin 81 MG chewable tablet Chew 1 tablet (81 mg total) by mouth daily.    . carvedilol (COREG) 6.25 MG tablet Take 1 tablet (6.25 mg total) by mouth 2 (two) times daily. 180 tablet 3  . clopidogrel (PLAVIX) 75 MG tablet Take 1 tablet (75 mg total) by mouth every evening. 30 tablet 3  . finasteride (PROSCAR) 5 MG tablet Take 5 mg by mouth every evening.     . isosorbide mononitrate (IMDUR) 30 MG 24 hr tablet Take 1 tablet (30 mg total) by mouth daily. 90 tablet 3  . nitroGLYCERIN (NITROSTAT) 0.4 MG SL tablet Place 1 tablet (0.4 mg total) under the tongue every 5 (five) minutes as needed for chest pain. 25 tablet 3  . omeprazole (PRILOSEC) 20 MG capsule TAKE ONE  CAPSULE BY MOUTH ONCE DAILY 90 capsule 1  . pravastatin (PRAVACHOL) 40 MG tablet Take 0.5 tablets (20 mg total) by mouth every evening. Takes half tab daily 45 tablet 3  . tamsulosin (FLOMAX) 0.4 MG CAPS capsule Take 0.4 mg by mouth daily after supper.    . VENTOLIN HFA 108 (90 BASE) MCG/ACT inhaler      No current facility-administered medications for this visit.    ROS: See HPI for pertinent positives and negatives.   Physical Examination  Filed Vitals:   06/06/14 1631  BP: 96/61  Pulse: 88  Temp: 97.1 F (36.2 C)  TempSrc: Oral  Resp: 16  Height: 5\' 8"  (1.727 m)  Weight: 187 lb (84.823 kg)  SpO2: 98%   Body mass index is 28.44 kg/(m^2).  General: A&O x 3, WDWN   Pulmonary: Sym exp, good air movt, CTAB, no rales, rhonchi, & wheezing   Cardiac: RRR, Nl S1, S2, no Murmurs detected  Vascular:  Vessel  Right  Left   Radial  Palpable  Palpable   Ulnar  Palpable  Palpable   Brachial  Palpable  Palpable   Carotid  Palpable, without bruit  Palpable, without bruit   Aorta  Not palpable due to pannus  N/A   Femoral  Palpable  Palpable   Popliteal  Not palpable  Not palpable   PT  Faintly Palpable  Faintly Palpable   DP  Faintly Palpable  Faintly Palpable    Musculoskeletal: M/S 5/5 throughout , Extremities without ischemic changes   Neurologic: Pain and light touch intact in extremities , Motor exam as listed above      Non-Invasive Vascular Imaging: DATE: 06/06/2014  DUPLEX SCAN OF right CIA stent: triphasic waveforms with widely patent right CIA stent.    ASSESSMENT: [name] is a 73 y.o. male who is s/p R CIA stent placement in 2013. The pt has no claudication symptoms in his right LE and mild symptoms in his left LE, no tissue loss. He does not have DM, he stopped smoking long ago. He takes a statin, ASA, and Plavix. ABI's done 05/23/14 ordered by Dr. Johnsie Cancel were normal, TBI's were slightly below normal. Duplex of  right iliac artery today reveals triphasic waveforms with widely patent right CIA stent.  PLAN:  I discussed in depth with the patient the nature of atherosclerosis, and emphasized the importance of maximal medical management including strict control of blood pressure, blood glucose, and lipid levels, obtaining regular exercise, and continued cessation of  smoking.  The patient is aware that without maximal medical management the underlying atherosclerotic disease process will progress, limiting the benefit of any interventions.  Graduated walking program Based on the patient's vascular studies and examination, pt will return to clinic in 1 year with ABI's and right aortoiliac Duplex.   The patient was given information about PAD including signs, symptoms, treatment, what symptoms should prompt the patient to seek immediate medical care, and risk reduction measures to take.  Clemon Chambers, RN, MSN, FNP-C [facility] Phone: [phone]  Clinic MD: Bridgett Larsson  06/06/2014 9:48 AM

## 2014-06-06 NOTE — Patient Instructions (Signed)

## 2014-06-12 ENCOUNTER — Encounter (HOSPITAL_COMMUNITY): Payer: Self-pay | Admitting: Vascular Surgery

## 2014-06-20 ENCOUNTER — Ambulatory Visit (INDEPENDENT_AMBULATORY_CARE_PROVIDER_SITE_OTHER): Payer: Medicare Other | Admitting: Family Medicine

## 2014-06-20 ENCOUNTER — Encounter: Payer: Self-pay | Admitting: Family Medicine

## 2014-06-20 VITALS — BP 142/70 | HR 88 | Temp 98.2°F | Wt 185.8 lb

## 2014-06-20 DIAGNOSIS — M5431 Sciatica, right side: Secondary | ICD-10-CM | POA: Diagnosis not present

## 2014-06-20 DIAGNOSIS — I70219 Atherosclerosis of native arteries of extremities with intermittent claudication, unspecified extremity: Secondary | ICD-10-CM

## 2014-06-20 MED ORDER — TRAMADOL HCL 50 MG PO TABS: 50.0000 mg | ORAL_TABLET | Freq: Three times a day (TID) | ORAL | Status: DC | PRN

## 2014-06-20 NOTE — Patient Instructions (Signed)
It looks like your sciatic nerve is irritated.  You can try ice and heat on your lower back. Gently stretch the back of your right thigh.  Take the tramadol for pain.  Take care.  Update me as needed.

## 2014-06-20 NOTE — Progress Notes (Signed)
Pre visit review using our clinic review tool, if applicable. No additional management support is needed unless otherwise documented below in the visit note.  Pain for the last 2 weeks.  Near the R hip and R leg.  It aches down the R leg, posteriorly.  No L sided pain.  No trauma, no falls.  No back pain.  No rash. No FCNAVD.  No dysuria.  Tramadol helped the pain.   Meds, vitals, and allergies reviewed.   ROS: See HPI.  Otherwise, noncontributory.  nad ncat Mmm rrr ctab Back w/o midline pain.  R hip with normal ROM but hamstring tight.  R greater troch not ttp R SLR pos Distally NV intact w/o weakness or loss of sensation.

## 2014-06-22 DIAGNOSIS — M5431 Sciatica, right side: Secondary | ICD-10-CM | POA: Insufficient documentation

## 2014-06-22 NOTE — Assessment & Plan Note (Signed)
D/w pt about ice and heat on lower back. Gently stretch the R hamstring. Take the tramadol for pain, with routine cautions.   F/u prn.  No need to image today.  He agrees.

## 2014-07-15 ENCOUNTER — Encounter: Payer: Self-pay | Admitting: Family Medicine

## 2014-07-15 ENCOUNTER — Ambulatory Visit (INDEPENDENT_AMBULATORY_CARE_PROVIDER_SITE_OTHER)
Admission: RE | Admit: 2014-07-15 | Discharge: 2014-07-15 | Disposition: A | Discharge status: Home / Self Care | Payer: Medicare Other | Source: Ambulatory Visit | Attending: Family Medicine | Admitting: Family Medicine

## 2014-07-15 ENCOUNTER — Ambulatory Visit (INDEPENDENT_AMBULATORY_CARE_PROVIDER_SITE_OTHER): Payer: Medicare Other | Admitting: Family Medicine

## 2014-07-15 VITALS — BP 108/56 | HR 87 | Temp 97.4°F | Wt 181.5 lb

## 2014-07-15 DIAGNOSIS — M5431 Sciatica, right side: Secondary | ICD-10-CM

## 2014-07-15 MED ORDER — PREDNISONE 20 MG PO TABS: ORAL_TABLET | ORAL | Status: DC

## 2014-07-15 NOTE — Progress Notes (Signed)
Pre visit review using our clinic review tool, if applicable. No additional management support is needed unless otherwise documented below in the visit note.  Pain for the last 6 weeks. Near the R hip and R leg. It aches down the R leg, posteriorly. No L sided pain. No trauma, no falls. No back pain. No rash. No FCNAVD. No dysuria.  Pain is worse as the day goes on.  Tramadol didn't help.  Ibuprofen helped some, taking 400mg , 4 times a day.  No claudication.   Meds, vitals, and allergies reviewed.   ROS: See HPI.  Otherwise, noncontributory.  nad ncat Mmm rrr ctab Back w/o midline pain.  R hip with normal ROM but hamstring tight.  R greater troch not ttp R SLR pos Distally NV intact w/o weakness or loss of sensation.

## 2014-07-15 NOTE — Patient Instructions (Signed)
Stop the ibuprofen for now.  Take prednisone with food and update me if not better.  Go to the lab on the way out.  We'll contact you with your xray report. If not better, we'll likely need to get you set up with the ortho clinic.  Take care.  Glad to see you.

## 2014-07-15 NOTE — Assessment & Plan Note (Signed)
Change to pred taper, routine cautions given, stop ibuprofen for now.  Check plain films today and notify pt.  He agrees.

## 2014-07-24 ENCOUNTER — Ambulatory Visit: Payer: Medicare Other | Admitting: Family Medicine

## 2014-07-24 ENCOUNTER — Encounter: Payer: Self-pay | Admitting: Family Medicine

## 2014-07-24 ENCOUNTER — Ambulatory Visit (INDEPENDENT_AMBULATORY_CARE_PROVIDER_SITE_OTHER): Payer: Medicare Other | Admitting: Family Medicine

## 2014-07-24 VITALS — BP 110/58 | HR 94 | Temp 97.5°F

## 2014-07-24 DIAGNOSIS — M5431 Sciatica, right side: Secondary | ICD-10-CM | POA: Diagnosis not present

## 2014-07-24 DIAGNOSIS — M25512 Pain in left shoulder: Secondary | ICD-10-CM | POA: Diagnosis not present

## 2014-07-24 MED ORDER — HYDROCODONE-ACETAMINOPHEN 10-325 MG PO TABS
0.5000 | ORAL_TABLET | Freq: Four times a day (QID) | ORAL | Status: DC | PRN
Start: 2014-07-24 — End: 2014-08-18

## 2014-07-24 NOTE — Patient Instructions (Signed)
Take vicodin for pain, sedation caution. [name] will call about your referral. I want you to try to see ortho next week. t Gently try to swing your left arm like a pendulum in the meantime.  Take care.

## 2014-07-24 NOTE — Progress Notes (Signed)
Pre visit review using our clinic review tool, if applicable. No additional management support is needed unless otherwise documented below in the visit note.  Prev with R leg pain, thought to be sciatica, seen 9 days ago, started on pred, finished it today.  R leg pain never got better.  No new sx, just not better.  Can still bear weight but with pain.  No true weakness, but sometimes his pain limits ROM.    Now with L shoulder pain after loading wood and "I think I pulled something."  He did feel a pop in his L shoulder.  He felt fairly well this AM but then the pain got worse in L shoulder and R leg again midday.  No FCNAV.  No rash.  No chest pain.  Not SOB, "I'm normal for me."    Tramadol doesn't help the pain.    nad but uncomfortable sitting in chair rrr ctab abd soft Normal neck ROM Sig pain lifting L arm, can only get to 90 deg and with sig pain.   Pain on int/ext rotation, pos impingement.  Back w/o midline pain.  Sensation intact in the BLE, no BLE weakness but R SLR positive.

## 2014-07-26 DIAGNOSIS — M25519 Pain in unspecified shoulder: Secondary | ICD-10-CM | POA: Insufficient documentation

## 2014-07-26 NOTE — Assessment & Plan Note (Signed)
Not improved with pred taper.  Change to vicodin with sedation caution.  Start with half tab dosing.  He agrees.  Refer to ortho.

## 2014-07-26 NOTE — Assessment & Plan Note (Signed)
Likely cuff sx, but no reason to suspect acute bony changes.  We didn't image due to his discomfort.  Not ttp on bony prominences.   Change to vicodin with sedation caution.  Start with half tab dosing.  He agrees.  Refer to ortho.  Anatomy of cuff d/w pt.  Use pendulum exercises/ROM in meantime.  He agrees.

## 2014-08-14 ENCOUNTER — Other Ambulatory Visit (HOSPITAL_COMMUNITY): Payer: Self-pay | Admitting: Physical Medicine and Rehabilitation

## 2014-08-14 DIAGNOSIS — M6281 Muscle weakness (generalized): Secondary | ICD-10-CM

## 2014-08-14 DIAGNOSIS — R269 Unspecified abnormalities of gait and mobility: Secondary | ICD-10-CM

## 2014-08-15 ENCOUNTER — Encounter (HOSPITAL_COMMUNITY)
Admission: RE | Admit: 2014-08-15 | Discharge: 2014-08-15 | Disposition: A | Discharge status: Home / Self Care | Payer: Medicare Other | Source: Ambulatory Visit | Attending: Physical Medicine and Rehabilitation | Admitting: Physical Medicine and Rehabilitation

## 2014-08-15 ENCOUNTER — Encounter (HOSPITAL_COMMUNITY): Payer: Self-pay

## 2014-08-15 ENCOUNTER — Encounter (HOSPITAL_COMMUNITY): Payer: Medicare Other

## 2014-08-15 DIAGNOSIS — R269 Unspecified abnormalities of gait and mobility: Secondary | ICD-10-CM

## 2014-08-15 DIAGNOSIS — M6281 Muscle weakness (generalized): Secondary | ICD-10-CM | POA: Diagnosis not present

## 2014-08-15 MED ORDER — TECHNETIUM TC 99M MEDRONATE IV KIT
25.0000 | PACK | Freq: Once | INTRAVENOUS | Status: AC | PRN
  Administered 2014-08-15: 25 via INTRAVENOUS

## 2014-08-18 ENCOUNTER — Ambulatory Visit (INDEPENDENT_AMBULATORY_CARE_PROVIDER_SITE_OTHER)
Admission: RE | Admit: 2014-08-18 | Discharge: 2014-08-18 | Disposition: A | Discharge status: Home / Self Care | Payer: Medicare Other | Source: Ambulatory Visit | Attending: Family Medicine | Admitting: Family Medicine

## 2014-08-18 ENCOUNTER — Encounter: Payer: Self-pay | Admitting: Family Medicine

## 2014-08-18 ENCOUNTER — Ambulatory Visit: Admission: RE | Admit: 2014-08-18 | Payer: Medicare Other | Source: Ambulatory Visit

## 2014-08-18 ENCOUNTER — Ambulatory Visit (INDEPENDENT_AMBULATORY_CARE_PROVIDER_SITE_OTHER): Payer: Medicare Other | Admitting: Family Medicine

## 2014-08-18 VITALS — BP 142/80 | HR 80 | Temp 97.4°F

## 2014-08-18 DIAGNOSIS — Z125 Encounter for screening for malignant neoplasm of prostate: Secondary | ICD-10-CM

## 2014-08-18 DIAGNOSIS — R9389 Abnormal findings on diagnostic imaging of other specified body structures: Secondary | ICD-10-CM

## 2014-08-18 DIAGNOSIS — R948 Abnormal results of function studies of other organs and systems: Secondary | ICD-10-CM | POA: Diagnosis not present

## 2014-08-18 DIAGNOSIS — R938 Abnormal findings on diagnostic imaging of other specified body structures: Secondary | ICD-10-CM

## 2014-08-18 DIAGNOSIS — M899 Disorder of bone, unspecified: Secondary | ICD-10-CM

## 2014-08-18 LAB — CBC WITH DIFFERENTIAL/PLATELET
Basophils Absolute: 0 10*3/uL (ref 0.0–0.1)
Basophils Relative: 0.3 % (ref 0.0–3.0)
Eosinophils Absolute: 0.1 10*3/uL (ref 0.0–0.7)
Eosinophils Relative: 1 % (ref 0.0–5.0)
HCT: 41.6 % (ref 39.0–52.0)
HEMOGLOBIN: 13.8 g/dL (ref 13.0–17.0)
LYMPHS PCT: 13.3 % (ref 12.0–46.0)
Lymphs Abs: 0.9 10*3/uL (ref 0.7–4.0)
MCHC: 33.2 g/dL (ref 30.0–36.0)
MCV: 83.9 fl (ref 78.0–100.0)
MONO ABS: 0.7 10*3/uL (ref 0.1–1.0)
Monocytes Relative: 10.6 % (ref 3.0–12.0)
Neutro Abs: 5 10*3/uL (ref 1.4–7.7)
Neutrophils Relative %: 74.8 % (ref 43.0–77.0)
Platelets: 220 10*3/uL (ref 150.0–400.0)
RBC: 4.96 Mil/uL (ref 4.22–5.81)
RDW: 14.4 % (ref 11.5–15.5)
WBC: 6.7 10*3/uL (ref 4.0–10.5)

## 2014-08-18 LAB — URINALYSIS, ROUTINE W REFLEX MICROSCOPIC
HGB URINE DIPSTICK: NEGATIVE
KETONES UR: NEGATIVE
Leukocytes, UA: NEGATIVE
Nitrite: NEGATIVE
Specific Gravity, Urine: >=1.03 — AB (ref 1.000–1.030)
Urine Glucose: 100 — AB
Urobilinogen, UA: 1 (ref 0.0–1.0)
pH: 5.5 (ref 5.0–8.0)

## 2014-08-18 LAB — COMPREHENSIVE METABOLIC PANEL
ALBUMIN: 3.6 g/dL (ref 3.5–5.2)
ALT: 20 U/L (ref 0–53)
AST: 17 U/L (ref 0–37)
Alkaline Phosphatase: 97 U/L (ref 39–117)
BILIRUBIN TOTAL: 1.1 mg/dL (ref 0.2–1.2)
BUN: 24 mg/dL — AB (ref 6–23)
CALCIUM: 9.7 mg/dL (ref 8.4–10.5)
CHLORIDE: 102 meq/L (ref 96–112)
CO2: 30 mEq/L (ref 19–32)
CREATININE: 0.99 mg/dL (ref 0.40–1.50)
GFR: 78.32 mL/min (ref >60.00)
GLUCOSE: 99 mg/dL (ref 70–99)
POTASSIUM: 4.4 meq/L (ref 3.5–5.1)
SODIUM: 140 meq/L (ref 135–145)
Total Protein: 6.7 g/dL (ref 6.0–8.3)

## 2014-08-18 LAB — PSA, MEDICARE: PSA: 9.12 ng/ml — ABNORMAL HIGH (ref 0.10–4.00)

## 2014-08-18 MED ORDER — OXYCODONE HCL 5 MG PO TABS
5.0000 mg | ORAL_TABLET | Freq: Four times a day (QID) | ORAL | Status: DC | PRN
Start: 2014-08-18 — End: 2014-09-01

## 2014-08-18 NOTE — Assessment & Plan Note (Signed)
D/w pt, wife, son.  Concern for CA.  CXR with possible nodular density in the right upper lobe.  PSA elevated, but this isn't a new finding.  SPEP and UPEP pending.  At this point, change to oxycodone and start miralax for bowel regimen.  Will order CT chest/abd/pelvis with contrast to see if other sites are involved.  After CT, will likely have to determine if/where biopsy should be performed.   Will send note to Dr. Ron Agee, Dr. Risa Grill, and Dr. Johnsie Cancel as Juluis Rainier.  App help of all involved.  >45 minutes spent in face to face time with patient, >50% spent in counselling or coordination of care.

## 2014-08-18 NOTE — Progress Notes (Signed)
Pre visit review using our clinic review tool, if applicable. No additional management support is needed unless otherwise documented below in the visit note.  To recap- refractory back pain, referred to ortho, MRI with L3 lesion noted, f/u bone scan with small focus of increased uptake along the right side of L3 corresponds with the abnormality on the recent MRI. 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  Here to discuss.    Still with sig pain, in spite of hydrocodone- it helps some but he was tired after taking it.  Asking about options. R lower back pain continues as prev o/w.    PMH and SH reviewed  ROS: See HPI, otherwise noncontributory.  Meds, vitals, and allergies reviewed.   nad  In wheelchair rrr ctab abd soft Ext wo edema Back not tested due to pain.

## 2014-08-18 NOTE — Patient Instructions (Signed)
Go to the lab on the way out.  We'll contact you with your lab report. I'll be in touch with your other docs.   Stop hydrocodone.  Start taking oxycodone for pain.  Start with 1 tab at a time. You can increase to 1.5 or 2 tabs at a time.  It can make you drowsy.  Start taking miralax once a day to keep your bowels moving.  Take care.  Glad to see you.

## 2014-08-20 ENCOUNTER — Ambulatory Visit (INDEPENDENT_AMBULATORY_CARE_PROVIDER_SITE_OTHER)
Admission: RE | Admit: 2014-08-20 | Discharge: 2014-08-20 | Disposition: A | Discharge status: Home / Self Care | Payer: Medicare Other | Source: Ambulatory Visit | Attending: Family Medicine | Admitting: Family Medicine

## 2014-08-20 ENCOUNTER — Other Ambulatory Visit: Payer: Self-pay | Admitting: Family Medicine

## 2014-08-20 ENCOUNTER — Telehealth: Payer: Self-pay | Admitting: *Deleted

## 2014-08-20 ENCOUNTER — Inpatient Hospital Stay: Admission: RE | Admit: 2014-08-20 | Payer: Medicare Other | Source: Ambulatory Visit

## 2014-08-20 ENCOUNTER — Other Ambulatory Visit: Payer: Self-pay | Admitting: *Deleted

## 2014-08-20 DIAGNOSIS — R938 Abnormal findings on diagnostic imaging of other specified body structures: Secondary | ICD-10-CM | POA: Diagnosis not present

## 2014-08-20 DIAGNOSIS — R948 Abnormal results of function studies of other organs and systems: Secondary | ICD-10-CM

## 2014-08-20 DIAGNOSIS — R918 Other nonspecific abnormal finding of lung field: Secondary | ICD-10-CM

## 2014-08-20 DIAGNOSIS — R9389 Abnormal findings on diagnostic imaging of other specified body structures: Secondary | ICD-10-CM

## 2014-08-20 LAB — PROTEIN ELECTROPHORESIS, URINE REFLEX: Total Protein, Urine: 28 mg/dL

## 2014-08-20 LAB — PROTEIN ELECTROPHORESIS, SERUM
Albumin ELP: 48.4 % — ABNORMAL LOW (ref 55.8–66.1)
Alpha-1-Globulin: 9 % — ABNORMAL HIGH (ref 2.9–4.9)
Alpha-2-Globulin: 17 % — ABNORMAL HIGH (ref 7.1–11.8)
BETA 2: 6.5 % (ref 3.2–6.5)
BETA GLOBULIN: 4.9 % (ref 4.7–7.2)
Gamma Globulin: 14.2 % (ref 11.1–18.8)
Total Protein, Serum Electrophoresis: 6.6 g/dL (ref 6.0–8.3)

## 2014-08-20 MED ORDER — IOHEXOL 300 MG/ML  SOLN
100.0000 mL | Freq: Once | INTRAMUSCULAR | Status: AC | PRN
  Administered 2014-08-20: 100 mL via INTRAVENOUS

## 2014-08-20 NOTE — Telephone Encounter (Signed)
Received referral for [name] to be seen by Dr. Julien Nordmann.  Dr. Julien Nordmann ok with appt on 08/25/14 arrival at 3:15.  Patient aware.

## 2014-08-22 ENCOUNTER — Telehealth: Payer: Self-pay | Admitting: Internal Medicine

## 2014-08-22 NOTE — Telephone Encounter (Signed)
Chart delivered on 08/22/14.  TG °

## 2014-08-25 ENCOUNTER — Encounter: Payer: Self-pay | Admitting: Internal Medicine

## 2014-08-25 ENCOUNTER — Ambulatory Visit (HOSPITAL_BASED_OUTPATIENT_CLINIC_OR_DEPARTMENT_OTHER): Payer: Medicare Other | Admitting: Internal Medicine

## 2014-08-25 ENCOUNTER — Telehealth: Payer: Self-pay | Admitting: Internal Medicine

## 2014-08-25 ENCOUNTER — Other Ambulatory Visit (HOSPITAL_BASED_OUTPATIENT_CLINIC_OR_DEPARTMENT_OTHER): Payer: Medicare Other

## 2014-08-25 VITALS — BP 77/45 | HR 108 | Temp 97.6°F | Resp 18 | Ht 68.0 in | Wt 154.5 lb

## 2014-08-25 DIAGNOSIS — I10 Essential (primary) hypertension: Secondary | ICD-10-CM

## 2014-08-25 DIAGNOSIS — R918 Other nonspecific abnormal finding of lung field: Secondary | ICD-10-CM

## 2014-08-25 DIAGNOSIS — L988 Other specified disorders of the skin and subcutaneous tissue: Secondary | ICD-10-CM

## 2014-08-25 DIAGNOSIS — M899 Disorder of bone, unspecified: Secondary | ICD-10-CM

## 2014-08-25 DIAGNOSIS — E041 Nontoxic single thyroid nodule: Secondary | ICD-10-CM

## 2014-08-25 DIAGNOSIS — R52 Pain, unspecified: Secondary | ICD-10-CM | POA: Diagnosis not present

## 2014-08-25 LAB — COMPREHENSIVE METABOLIC PANEL (CC13)
ALT: 23 U/L (ref 0–55)
AST: 20 U/L (ref 5–34)
Albumin: 2.5 g/dL — ABNORMAL LOW (ref 3.5–5.0)
Alkaline Phosphatase: 103 U/L (ref 40–150)
Anion Gap: 12 mEq/L — ABNORMAL HIGH (ref 3–11)
BILIRUBIN TOTAL: 2.25 mg/dL — AB (ref 0.20–1.20)
BUN: 20.2 mg/dL (ref 7.0–26.0)
CALCIUM: 9.4 mg/dL (ref 8.4–10.4)
CO2: 23 meq/L (ref 22–29)
Chloride: 99 mEq/L (ref 98–109)
Creatinine: 1.1 mg/dL (ref 0.7–1.3)
EGFR: 68 mL/min/{1.73_m2} — AB (ref >90)
GLUCOSE: 125 mg/dL (ref 70–140)
Potassium: 4.5 mEq/L (ref 3.5–5.1)
Sodium: 135 mEq/L — ABNORMAL LOW (ref 136–145)
TOTAL PROTEIN: 6.3 g/dL — AB (ref 6.4–8.3)

## 2014-08-25 LAB — CBC WITH DIFFERENTIAL/PLATELET
BASO%: 0.4 % (ref 0.0–2.0)
BASOS ABS: 0 10*3/uL (ref 0.0–0.1)
EOS%: 0.8 % (ref 0.0–7.0)
Eosinophils Absolute: 0.1 10*3/uL (ref 0.0–0.5)
HCT: 36.7 % — ABNORMAL LOW (ref 38.4–49.9)
HGB: 11.9 g/dL — ABNORMAL LOW (ref 13.0–17.1)
LYMPH#: 0.7 10*3/uL — AB (ref 0.9–3.3)
LYMPH%: 9.1 % — AB (ref 14.0–49.0)
MCH: 26.7 pg — AB (ref 27.2–33.4)
MCHC: 32.4 g/dL (ref 32.0–36.0)
MCV: 82.5 fL (ref 79.3–98.0)
MONO#: 0.9 10*3/uL (ref 0.1–0.9)
MONO%: 11.7 % (ref 0.0–14.0)
NEUT%: 78 % — ABNORMAL HIGH (ref 39.0–75.0)
NEUTROS ABS: 6.1 10*3/uL (ref 1.5–6.5)
PLATELETS: 269 10*3/uL (ref 140–400)
RBC: 4.45 10*6/uL (ref 4.20–5.82)
RDW: 14.3 % (ref 11.0–14.6)
WBC: 7.8 10*3/uL (ref 4.0–10.3)

## 2014-08-25 NOTE — Telephone Encounter (Signed)
Gave avs & calendar for March. °

## 2014-08-25 NOTE — Progress Notes (Signed)
[name] [phone]   [phone]  CONSULT NOTE  REFERRING PHYSICIAN: Dr. [name]  REASON FOR CONSULTATION:  74 years old white male with questionable metastatic lung cancer.  HPI [name] is a 74 y.o. male was past medical history significant for hypertension, dyslipidemia, colon polyps, coronary artery disease, peripheral vascular disease and history of heavy smoking but quit 35 years ago. Since December 2015 the patient has been complaining of back pain with radiation to the left leg and hip area. He had x-ray of the lumbar spine performed on 07/15/2014 that showed no acute abnormalities. He was referred to orthopedic surgery an MRI of the lumbar spine Watts performed on 08/12/2014 and it showed 1.5 cm ill-defined lesion in the right L3 pedicle, suspicious for metastatic disease. Nuclear bone scan was performed on 08/15/2014 and it showed abnormal uptake in the distal left femur and abnormal uptake in the pelvis. There was also small focus of increased uptake along the right side of the L3 corresponding to the abnormality on the MRI. 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 The patient was seen by his primary care physician Dr. Damita Dunnings and CT scan of the chest, abdomen and pelvis were performed on 08/20/2014 and it showed 1.3 x 1.2 cm spiculated right upper lobe nodule suspicious for primary lung neoplasm. There was also probable hepatic and splenic metastasis. There was a cyst metastasis involving the right acetabulum and to the right hip joint, left iliac wing and left coracoid process. There was also left thyroid nodules. Dr. Damita Dunnings kindly referred the patient to me today for evaluation and recommendation regarding these abnormalities.  When seen today the patient continues to complain of back pain with radiation to the right femur and right lower extremity. He also has weakness in the right upper and lower extremities. He  lost around 30 pounds in the last 2 months. The patient denied having any significant chest pain, shortness breath, cough or hemoptysis. He denied having any headache or visual changes. He denied having any significant nausea or vomiting or change in his bowel movement. Family history significant for mother with heart disease, father had stroke, brother had lung cancer and prostate cancer and a sister with stomach cancer. The patient is married and has 2 children. His wife, [name] is also my patient. She accompanied him to the visit today. He used to work as a Engineer, civil (consulting). He has a history of smoking 3 packs per day for around 30 years but quit 35 years ago. He has no history of alcohol or drug abuse. HPI  Past Medical History  Diagnosis Date  . Hyperlipidemia   . Hypertension   . OAD (obstructive airway disease)   . GERD (gastroesophageal reflux disease)   . Diverticulosis   . ED (erectile dysfunction)     no relief with cialis, etc  . Back pain     per [facility]  . Thyroid nodule   . Anemia   . Irregular heartbeat   . Peripheral vascular disease   . Bronchitis   . AAA (abdominal aortic aneurysm)   . Emphysema     "has a little bit" (07/16/2013)  . Pneumonia     "twice since 1959; once when he was a child" (07/16/2013)  . Exertional shortness of breath   . DDD (degenerative disc disease)     cervical, followed by [facility] 2012  . Bone lesion     L3 on MRI/bone scan 08/2014  Past Surgical History  Procedure Laterality Date  . Small intestine surgery      "blockage" (07/16/2013)  . Skin graft Left     "hand"  . Tonsillectomy    . Colonoscopy w/ polypectomy    . Aortogram  Aug. 1, 2013  . Colectomy  1961  . Iliac artery stent Right 02/2012    "Dr. Bridgett Larsson"  . Cholecystectomy N/A 09/20/2013    Procedure: LAPAROSCOPIC CHOLECYSTECTOMY WITH INTRAOPERATIVE CHOLANGIOGRAM;  Surgeon: Joyice Faster. Cornett, MD;  Location: [place];  Service: General;  Laterality: N/A;  . Abdominal aortagram N/A  02/02/2012    Procedure: ABDOMINAL AORTAGRAM;  Surgeon: Conrad Anacortes, MD;  Location: [facility] CATH LAB;  Service: Cardiovascular;  Laterality: N/A;  . Left heart catheterization with coronary angiogram N/A 09/11/2013    Procedure: LEFT HEART CATHETERIZATION WITH CORONARY ANGIOGRAM;  Surgeon: Josue Hector, MD;  Location: [facility] CATH LAB;  Service: Cardiovascular;  Laterality: N/A;    Family History  Problem Relation Age of Onset  . Heart attack Mother   . Heart disease Mother   . Hyperlipidemia Mother   . Stroke Father   . Heart attack Father   . Heart disease Father   . Hyperlipidemia Father   . Hypertension Father   . Other Father     varicose veins  . Heart attack Brother   . Prostate cancer Brother   . Cancer Brother     lung  . Other Brother     varicose veins  . Heart attack Brother   . Heart disease Brother   . Hyperlipidemia Brother   . Other Brother     varicose veins  . Breast cancer Sister   . Diabetes Sister   . Cancer Sister     stomach  . Heart disease Brother   . Hyperlipidemia Brother   . Hypertension Brother   . Heart attack Brother     Social History History  Substance Use Topics  . Smoking status: Former Smoker -- 3.00 packs/day for 30 years    Types: Cigarettes    Quit date: 02/28/1982  . Smokeless tobacco: Never Used  . Alcohol Use: No     Comment: 07/16/2013 "used to drink a couple beers/night; stopped  02/2012"    Allergies  Allergen Reactions  . Hydrochlorothiazide Other (See Comments)    REACTION: joint pain  . Benazepril Hcl Other (See Comments)    unknown    Current Outpatient Prescriptions  Medication Sig Dispense Refill  . aspirin 81 MG chewable tablet Chew 1 tablet (81 mg total) by mouth daily.    . carvedilol (COREG) 6.25 MG tablet Take 1 tablet (6.25 mg total) by mouth 2 (two) times daily. 180 tablet 3  . clopidogrel (PLAVIX) 75 MG tablet Take 1 tablet (75 mg total) by mouth every evening. 30 tablet 3  . finasteride (PROSCAR) 5 MG  tablet Take 5 mg by mouth every evening.     . isosorbide mononitrate (IMDUR) 30 MG 24 hr tablet Take 1 tablet (30 mg total) by mouth daily. 90 tablet 3  . omeprazole (PRILOSEC) 20 MG capsule TAKE ONE CAPSULE BY MOUTH ONCE DAILY 90 capsule 1  . oxyCODONE (ROXICODONE) 5 MG immediate release tablet Take 1-2 tablets (5-10 mg total) by mouth every 6 (six) hours as needed for severe pain (sedation caution). 60 tablet 0  . polyethylene glycol (MIRALAX / GLYCOLAX) packet Take 17 g by mouth daily.    . pravastatin (PRAVACHOL) 40 MG tablet Take 0.5  tablets (20 mg total) by mouth every evening. Takes half tab daily 45 tablet 3  . tamsulosin (FLOMAX) 0.4 MG CAPS capsule Take 0.4 mg by mouth daily after supper.    . nitroGLYCERIN (NITROSTAT) 0.4 MG SL tablet Place 1 tablet (0.4 mg total) under the tongue every 5 (five) minutes as needed for chest pain. (Patient not taking: Reported on 08/25/2014) 25 tablet 3  . VENTOLIN HFA 108 (90 BASE) MCG/ACT inhaler      No current facility-administered medications for this visit.    Review of Systems  Constitutional: positive for anorexia, fatigue and weight loss Eyes: negative Ears, nose, mouth, throat, and face: negative Respiratory: negative Cardiovascular: negative Gastrointestinal: negative Genitourinary:negative Integument/breast: negative Hematologic/lymphatic: negative Musculoskeletal:positive for back pain, bone pain and muscle weakness Neurological: negative Behavioral/Psych: negative Endocrine: negative Allergic/Immunologic: negative  Physical Exam  PIR:JJOAC, healthy, no distress, well nourished and well developed SKIN: skin color, texture, turgor are normal, no rashes or significant lesions HEAD: Normocephalic, No masses, lesions, tenderness or abnormalities EYES: normal, PERRLA, Conjunctiva are pink and non-injected EARS: External ears normal, Canals clear OROPHARYNX:no exudate, no erythema and lips, buccal mucosa, and tongue normal  NECK:  supple, no adenopathy, no JVD LYMPH:  no palpable lymphadenopathy, no hepatosplenomegaly LUNGS: clear to auscultation , and palpation HEART: regular rate & rhythm, no murmurs and no gallops ABDOMEN:abdomen soft, non-tender, normal bowel sounds and no masses or organomegaly BACK: Back symmetric, no curvature., No CVA tenderness EXTREMITIES:no joint deformities, effusion, or inflammation, no edema, no skin discoloration  NEURO: alert & oriented x 3 with fluent speech, weakness of the right upper and lower extremities.  PERFORMANCE STATUS: ECOG 1-2  LABORATORY DATA: Lab Results  Component Value Date   WBC 7.8 08/25/2014   HGB 11.9* 08/25/2014   HCT 36.7* 08/25/2014   MCV 82.5 08/25/2014   PLT 269 08/25/2014      Chemistry      Component Value Date/Time   NA 135* 08/25/2014 1452   NA 140 08/18/2014 1404   K 4.5 08/25/2014 1452   K 4.4 08/18/2014 1404   CL 102 08/18/2014 1404   CO2 23 08/25/2014 1452   CO2 30 08/18/2014 1404   BUN 20.2 08/25/2014 1452   BUN 24* 08/18/2014 1404   CREATININE 1.1 08/25/2014 1452   CREATININE 0.99 08/18/2014 1404      Component Value Date/Time   CALCIUM 9.4 08/25/2014 1452   CALCIUM 9.7 08/18/2014 1404   ALKPHOS 103 08/25/2014 1452   ALKPHOS 97 08/18/2014 1404   AST 20 08/25/2014 1452   AST 17 08/18/2014 1404   ALT 23 08/25/2014 1452   ALT 20 08/18/2014 1404   BILITOT 2.25* 08/25/2014 1452   BILITOT 1.1 08/18/2014 1404       RADIOGRAPHIC STUDIES: Dg Chest 1 View  08/18/2014   CLINICAL DATA:  Back pain.  EXAM: CHEST  1 VIEW  COMPARISON:  09/21/2013.  FINDINGS: Mediastinum and hilar structures are normal. Questionable density is noted in the right upper lobe. Close follow-up chest x-rays are recommended demonstrate clearing. This is not nonenhanced chest CT suggested for further evaluation. Mild basilar atelectasis heart size stable. No pleural effusion or pneumothorax.  IMPRESSION: Questionable small infiltrate versus nodular density in the  right upper lobe. Follow-up chest x-ray is suggested to demonstrate clearing. If this does not clear nonenhanced chest CT suggested to exclude a right upper lobe mass.   Electronically Signed   By: [place]   On: 08/18/2014 14:39   Ct Chest  W Contrast  08/20/2014   CLINICAL DATA:  Severe bone pain for 3 weeks, anorexia, 25 pound weight loss, abnormal bone scan demonstrating pelvic bone lesions, lung mass on chest radiograph question metastatic disease, past history coronary artery disease, essential hypertension, emphysema, GERD, former smoker  EXAM: CT CHEST, ABDOMEN, AND PELVIS WITH CONTRAST  TECHNIQUE: Multidetector CT imaging of the chest, abdomen and pelvis was performed following the standard protocol during bolus administration of intravenous contrast. Sagittal and coronal MPR images reconstructed from axial data set.  CONTRAST:  141mL OMNIPAQUE IOHEXOL 300 MG/ML SOLN IV. Dilute oral contrast.  COMPARISON:  CT abdomen and pelvis 09/17/2013, bone scan 08/15/2014 ; no prior CT chest for comparison.  FINDINGS: CT CHEST FINDINGS  Scattered atherosclerotic calcifications aorta and coronary arteries.  10 mm LEFT thyroid nodule image 9.  Additional nodule posterior to the LEFT thyroid lobe 11 x 13 mm image 7 question thyroid origin versus lymph node.  8 mm RIGHT paratracheal lymph node image 81.  8 mm precarinal lymph node image 24.  No additional thoracic adenopathy.  Spiculated anterior RIGHT upper lobe nodule image 15 measuring 13 x 12 mm.  Scattered atelectasis in medial RIGHT upper lobe, lingula, and in RIGHT middle lobe.  No additional mass/nodule, infiltrate, pleural effusion, or pneumothorax.  Osseous demineralization or Schmorl's node at superior endplate of V89.  Destructive lesion at LEFT coracoid process.  CT ABDOMEN AND PELVIS FINDINGS  Multiple small low-attenuation liver lesions concerning for metastases, largest 14 x 11 mm dome of RIGHT lobe image 45.  Mild splenic enlargement with a  poorly defined lesion medially 2.4 x 2.5 cm image 54.  Tiny cyst inferior pole LEFT kidney.  Pancreas, kidneys, and adrenal glands otherwise normal.  Gallbladder surgically absent.  Normal appendix, bladder, and ureters.  Minimal prostatic enlargement.  Stomach and bowel loops normal.  Extensive atherosclerotic calcifications.  No adenopathy, free fluid, free air, hernia, or inflammatory process.  Large destructive bone lesion with soft tissue mass at posterior column RIGHT acetabulum, 6.0 x 6.2 cm image 120, extending through acetabular wall to RIGHT hip joint.  Bones demineralized with minimal scattered degenerative changes.  Probable lytic lesion within LEFT ilium 2 cm diameter coronal image 78.  IMPRESSION: 13 x 12 mm spiculated RIGHT upper lobe nodule suspicious for primary lung neoplasm.  Probable hepatic and splenic metastases.  Osseous metastases involving the RIGHT acetabulum into RIGHT hip joint, LEFT iliac wing, and LEFT coracoid process.  Area of abnormal radionuclide localization at the distal RIGHT femoral diaphysis on prior bone scan is not assessed by this CT exam; recommend dedicated radiographic assessment.  LEFT thyroid nodules; recommend further evaluation with thyroid ultrasound.  Atherosclerotic disease.   Electronically Signed   By: Lavonia Dana M.D.   On: 08/20/2014 13:03   Nm Bone Scan Whole Body  08/15/2014   ADDENDUM REPORT: 08/15/2014 13:45  ADDENDUM: Comparison made with a lumbar MRI on 08/12/2014. The small focus of increased uptake along the right side of L3 corresponds with the abnormality on the recent MRI. 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   Electronically Signed   By: Markus Daft M.D.   On: 08/15/2014 13:45   08/15/2014   ADDENDUM REPORT: 08/15/2014 12:36  ADDENDUM: These results will be called to the ordering clinician or representative by the Radiologist Assistant, and communication documented  in the PACS or zVision Dashboard.   Electronically Signed   By: Quita Skye  Anselm Pancoast M.D.   On: 08/15/2014 12:36   08/15/2014   CLINICAL DATA:  Muscle weakness and gait abnormality.  EXAM: NUCLEAR MEDICINE WHOLE BODY BONE SCAN  TECHNIQUE: Whole body anterior and posterior images were obtained approximately 3 hours after intravenous injection of radiopharmaceutical.  RADIOPHARMACEUTICALS:  25.0 mCi Technetium-99 MDP  COMPARISON:  Abdominal CT 09/17/2013  FINDINGS: Focus of increased uptake in the distal diaphysis of the left femur. This location raises concern for a lesion or injury. There is increased uptake involving the right side of the pelvis around the right acetabulum. There is also concern for additional small foci in the left iliac wing. There are no gross abnormalities in the pelvis on the prior CT examination.  There is a focus of increased uptake along the medial left shoulder which could be degenerative in etiology. Expected uptake in the kidneys and urinary bladder. Small focus of increased uptake along the right side of the lumbar spine near L3 is likely degenerative in etiology. Small focus of increased uptake in the paranasal sinuses region is likely inflammatory.  IMPRESSION: Abnormal uptake in the distal left femur and abnormal uptake in the pelvis. There is no gross abnormality in the pelvis on the prior CT to explain these findings. Findings raise concern for multiple bone lesions. Recommend obtaining a PSA level. The left femur could be further evaluated with radiography. Pelvis could be further evaluated with MRI.  Focus of increased uptake in the left shoulder is likely degenerative in etiology.  Electronically Signed: By: Markus Daft M.D. On: 08/15/2014 12:01   Ct Abdomen Pelvis W Contrast  08/20/2014   CLINICAL DATA:  Severe bone pain for 3 weeks, anorexia, 25 pound weight loss, abnormal bone scan demonstrating pelvic bone lesions, lung mass on chest radiograph question metastatic disease, past  history coronary artery disease, essential hypertension, emphysema, GERD, former smoker  EXAM: CT CHEST, ABDOMEN, AND PELVIS WITH CONTRAST  TECHNIQUE: Multidetector CT imaging of the chest, abdomen and pelvis was performed following the standard protocol during bolus administration of intravenous contrast. Sagittal and coronal MPR images reconstructed from axial data set.  CONTRAST:  130mL OMNIPAQUE IOHEXOL 300 MG/ML SOLN IV. Dilute oral contrast.  COMPARISON:  CT abdomen and pelvis 09/17/2013, bone scan 08/15/2014 ; no prior CT chest for comparison.  FINDINGS: CT CHEST FINDINGS  Scattered atherosclerotic calcifications aorta and coronary arteries.  10 mm LEFT thyroid nodule image 9.  Additional nodule posterior to the LEFT thyroid lobe 11 x 13 mm image 7 question thyroid origin versus lymph node.  8 mm RIGHT paratracheal lymph node image 81.  8 mm precarinal lymph node image 24.  No additional thoracic adenopathy.  Spiculated anterior RIGHT upper lobe nodule image 15 measuring 13 x 12 mm.  Scattered atelectasis in medial RIGHT upper lobe, lingula, and in RIGHT middle lobe.  No additional mass/nodule, infiltrate, pleural effusion, or pneumothorax.  Osseous demineralization or Schmorl's node at superior endplate of S23.  Destructive lesion at LEFT coracoid process.  CT ABDOMEN AND PELVIS FINDINGS  Multiple small low-attenuation liver lesions concerning for metastases, largest 14 x 11 mm dome of RIGHT lobe image 45.  Mild splenic enlargement with a poorly defined lesion medially 2.4 x 2.5 cm image 54.  Tiny cyst inferior pole LEFT kidney.  Pancreas, kidneys, and adrenal glands otherwise normal.  Gallbladder surgically absent.  Normal appendix, bladder, and ureters.  Minimal prostatic enlargement.  Stomach and bowel loops normal.  Extensive atherosclerotic calcifications.  No adenopathy, free fluid, free air, hernia,  or inflammatory process.  Large destructive bone lesion with soft tissue mass at posterior column RIGHT  acetabulum, 6.0 x 6.2 cm image 120, extending through acetabular wall to RIGHT hip joint.  Bones demineralized with minimal scattered degenerative changes.  Probable lytic lesion within LEFT ilium 2 cm diameter coronal image 78.  IMPRESSION: 13 x 12 mm spiculated RIGHT upper lobe nodule suspicious for primary lung neoplasm.  Probable hepatic and splenic metastases.  Osseous metastases involving the RIGHT acetabulum into RIGHT hip joint, LEFT iliac wing, and LEFT coracoid process.  Area of abnormal radionuclide localization at the distal RIGHT femoral diaphysis on prior bone scan is not assessed by this CT exam; recommend dedicated radiographic assessment.  LEFT thyroid nodules; recommend further evaluation with thyroid ultrasound.  Atherosclerotic disease.   Electronically Signed   By: Lavonia Dana M.D.   On: 08/20/2014 13:03    ASSESSMENT: This is a very pleasant 74 years old white male presented with questionable metastatic malignancy suspicious for lung cancer but other primary cancer cannot be excluded at this point.   PLAN: I had a lengthy discussion with the patient and his wife today about his current disease status and further investigation to confirm a diagnosis. I will complete the staging workup by ordering a MRI of the brain as well as a PET scan to rule out other metastatic disease and also to evaluate the right upper and lower extremity weakness. These tests were ordered STAT. I also referred the patient to interventional radiology for consideration of CT-guided core biopsy of the soft tissue lesion in the right hip area.  I will see the patient back for follow-up visit after the biopsy and imaging studies for more detailed discussion of his condition and treatment options. For pain management the patient will continue on oxycodone for now. He was advised to call immediately if he has any concerning symptoms in the interval.  The patient voices understanding of current disease status and  treatment options and is in agreement with the current care plan.  All questions were answered. The patient knows to call the clinic with any problems, questions or concerns. We can certainly see the patient much sooner if necessary.  Thank you so much for allowing me to participate in the care of [name]. I will continue to follow up the patient with you and assist in his care.  I spent 40 minutes counseling the patient face to face. The total time spent in the appointment was 60 minutes.  Disclaimer: This note was dictated with voice recognition software. Similar sounding words can inadvertently be transcribed and may not be corrected upon review.   Katara Griner K. August 25, 2014, 5:30 PM

## 2014-08-28 ENCOUNTER — Telehealth: Payer: Self-pay | Admitting: Family Medicine

## 2014-08-28 DIAGNOSIS — R269 Unspecified abnormalities of gait and mobility: Secondary | ICD-10-CM

## 2014-08-28 DIAGNOSIS — M899 Disorder of bone, unspecified: Secondary | ICD-10-CM

## 2014-08-28 NOTE — Telephone Encounter (Signed)
Spouse called to say that [name] is getting worse and cannot walk at all.  She wanted to know if there was home health people she could get to come in and help her with [name].  He has medicare and [employer]  He had another appointment with cancer dr on 09/10/14 Tuesday bone biopsy and 3/7 his has mri and pet scan

## 2014-08-28 NOTE — Telephone Encounter (Signed)
Ordered.  Thanks.  Routed to [facility] and [facility] as FYI.

## 2014-08-29 ENCOUNTER — Emergency Department (HOSPITAL_COMMUNITY): Payer: Medicare Other

## 2014-08-29 ENCOUNTER — Encounter (HOSPITAL_COMMUNITY): Payer: Self-pay | Admitting: Emergency Medicine

## 2014-08-29 ENCOUNTER — Telehealth: Payer: Self-pay | Admitting: Family Medicine

## 2014-08-29 ENCOUNTER — Inpatient Hospital Stay (HOSPITAL_COMMUNITY)
Admission: EM | Admit: 2014-08-29 | Discharge: 2014-09-01 | DRG: 640 | Disposition: A | Discharge status: Hospice | Payer: Medicare Other | Attending: Internal Medicine | Admitting: Internal Medicine

## 2014-08-29 DIAGNOSIS — Z833 Family history of diabetes mellitus: Secondary | ICD-10-CM

## 2014-08-29 DIAGNOSIS — Z79891 Long term (current) use of opiate analgesic: Secondary | ICD-10-CM | POA: Diagnosis not present

## 2014-08-29 DIAGNOSIS — Z8042 Family history of malignant neoplasm of prostate: Secondary | ICD-10-CM

## 2014-08-29 DIAGNOSIS — Z823 Family history of stroke: Secondary | ICD-10-CM | POA: Diagnosis not present

## 2014-08-29 DIAGNOSIS — R531 Weakness: Secondary | ICD-10-CM | POA: Diagnosis not present

## 2014-08-29 DIAGNOSIS — Z66 Do not resuscitate: Secondary | ICD-10-CM | POA: Diagnosis present

## 2014-08-29 DIAGNOSIS — C7889 Secondary malignant neoplasm of other digestive organs: Secondary | ICD-10-CM | POA: Diagnosis present

## 2014-08-29 DIAGNOSIS — Z515 Encounter for palliative care: Secondary | ICD-10-CM | POA: Diagnosis not present

## 2014-08-29 DIAGNOSIS — C7951 Secondary malignant neoplasm of bone: Secondary | ICD-10-CM | POA: Diagnosis present

## 2014-08-29 DIAGNOSIS — I10 Essential (primary) hypertension: Secondary | ICD-10-CM | POA: Diagnosis present

## 2014-08-29 DIAGNOSIS — C787 Secondary malignant neoplasm of liver and intrahepatic bile duct: Secondary | ICD-10-CM | POA: Diagnosis present

## 2014-08-29 DIAGNOSIS — K219 Gastro-esophageal reflux disease without esophagitis: Secondary | ICD-10-CM | POA: Diagnosis present

## 2014-08-29 DIAGNOSIS — Z6822 Body mass index (BMI) 22.0-22.9, adult: Secondary | ICD-10-CM | POA: Diagnosis not present

## 2014-08-29 DIAGNOSIS — R918 Other nonspecific abnormal finding of lung field: Secondary | ICD-10-CM | POA: Diagnosis present

## 2014-08-29 DIAGNOSIS — Z7982 Long term (current) use of aspirin: Secondary | ICD-10-CM

## 2014-08-29 DIAGNOSIS — Z87891 Personal history of nicotine dependence: Secondary | ICD-10-CM

## 2014-08-29 DIAGNOSIS — E43 Unspecified severe protein-calorie malnutrition: Secondary | ICD-10-CM | POA: Diagnosis present

## 2014-08-29 DIAGNOSIS — Z7902 Long term (current) use of antithrombotics/antiplatelets: Secondary | ICD-10-CM | POA: Diagnosis not present

## 2014-08-29 DIAGNOSIS — R410 Disorientation, unspecified: Secondary | ICD-10-CM

## 2014-08-29 DIAGNOSIS — Z9049 Acquired absence of other specified parts of digestive tract: Secondary | ICD-10-CM | POA: Diagnosis present

## 2014-08-29 DIAGNOSIS — E785 Hyperlipidemia, unspecified: Secondary | ICD-10-CM | POA: Diagnosis present

## 2014-08-29 DIAGNOSIS — C799 Secondary malignant neoplasm of unspecified site: Secondary | ICD-10-CM | POA: Diagnosis present

## 2014-08-29 DIAGNOSIS — Z8249 Family history of ischemic heart disease and other diseases of the circulatory system: Secondary | ICD-10-CM

## 2014-08-29 DIAGNOSIS — C801 Malignant (primary) neoplasm, unspecified: Secondary | ICD-10-CM | POA: Diagnosis present

## 2014-08-29 DIAGNOSIS — E860 Dehydration: Principal | ICD-10-CM | POA: Diagnosis present

## 2014-08-29 HISTORY — DX: Malignant (primary) neoplasm, unspecified: C80.1

## 2014-08-29 LAB — CBC
HEMATOCRIT: 37.5 % — AB (ref 39.0–52.0)
HEMOGLOBIN: 12.3 g/dL — AB (ref 13.0–17.0)
MCH: 27.7 pg (ref 26.0–34.0)
MCHC: 32.8 g/dL (ref 30.0–36.0)
MCV: 84.5 fL (ref 78.0–100.0)
PLATELETS: 275 10*3/uL (ref 150–400)
RBC: 4.44 MIL/uL (ref 4.22–5.81)
RDW: 13.6 % (ref 11.5–15.5)
WBC: 7.8 10*3/uL (ref 4.0–10.5)

## 2014-08-29 LAB — URINALYSIS, ROUTINE W REFLEX MICROSCOPIC
Glucose, UA: NEGATIVE mg/dL
Hgb urine dipstick: NEGATIVE
Leukocytes, UA: NEGATIVE
Nitrite: NEGATIVE
Protein, ur: NEGATIVE mg/dL
SPECIFIC GRAVITY, URINE: 1.025 (ref 1.005–1.030)
Urobilinogen, UA: 1 mg/dL (ref 0.0–1.0)
pH: 5 (ref 5.0–8.0)

## 2014-08-29 LAB — BASIC METABOLIC PANEL
Anion gap: 7 (ref 5–15)
BUN: 29 mg/dL — ABNORMAL HIGH (ref 6–23)
CALCIUM: 9.2 mg/dL (ref 8.4–10.5)
CO2: 29 mmol/L (ref 19–32)
Chloride: 104 mmol/L (ref 96–112)
Creatinine, Ser: 0.81 mg/dL (ref 0.50–1.35)
GFR calc Af Amer: >90 mL/min (ref >90)
GFR, EST NON AFRICAN AMERICAN: 85 mL/min — AB (ref >90)
Glucose, Bld: 145 mg/dL — ABNORMAL HIGH (ref 70–99)
Potassium: 4 mmol/L (ref 3.5–5.1)
SODIUM: 140 mmol/L (ref 135–145)

## 2014-08-29 LAB — TROPONIN I

## 2014-08-29 MED ORDER — OXYCODONE HCL 5 MG PO TABS
5.0000 mg | ORAL_TABLET | Freq: Four times a day (QID) | ORAL | Status: DC | PRN
  Administered 2014-08-30 (×2): 10 mg via ORAL
  Administered 2014-08-30: 5 mg via ORAL
  Filled 2014-08-29 (×2): qty 2
  Filled 2014-08-29: qty 1

## 2014-08-29 MED ORDER — ONDANSETRON HCL 4 MG PO TABS: 4.0000 mg | ORAL_TABLET | Freq: Four times a day (QID) | ORAL | Status: DC | PRN

## 2014-08-29 MED ORDER — ALBUTEROL SULFATE HFA 108 (90 BASE) MCG/ACT IN AERS
2.0000 | INHALATION_SPRAY | Freq: Four times a day (QID) | RESPIRATORY_TRACT | Status: DC | PRN
  Filled 2014-08-29: qty 6.7

## 2014-08-29 MED ORDER — ASPIRIN 81 MG PO CHEW
81.0000 mg | CHEWABLE_TABLET | Freq: Every day | ORAL | Status: DC
  Administered 2014-08-29 – 2014-09-01 (×4): 81 mg via ORAL
  Filled 2014-08-29 (×4): qty 1

## 2014-08-29 MED ORDER — FENTANYL CITRATE 0.05 MG/ML IJ SOLN
50.0000 ug | Freq: Once | INTRAMUSCULAR | Status: AC
  Administered 2014-08-29: 50 ug via INTRAVENOUS
  Filled 2014-08-29: qty 2

## 2014-08-29 MED ORDER — HEPARIN SODIUM (PORCINE) 5000 UNIT/ML IJ SOLN
5000.0000 [IU] | Freq: Three times a day (TID) | INTRAMUSCULAR | Status: DC
  Administered 2014-08-29 – 2014-09-01 (×8): 5000 [IU] via SUBCUTANEOUS
  Filled 2014-08-29 (×8): qty 1

## 2014-08-29 MED ORDER — ISOSORBIDE MONONITRATE ER 30 MG PO TB24
30.0000 mg | ORAL_TABLET | Freq: Every day | ORAL | Status: DC
  Administered 2014-08-30 – 2014-09-01 (×3): 30 mg via ORAL
  Filled 2014-08-29 (×3): qty 1

## 2014-08-29 MED ORDER — ONDANSETRON HCL 4 MG/2ML IJ SOLN: 4.0000 mg | Freq: Four times a day (QID) | INTRAMUSCULAR | Status: DC | PRN

## 2014-08-29 MED ORDER — POLYETHYLENE GLYCOL 3350 17 G PO PACK
17.0000 g | PACK | Freq: Every day | ORAL | Status: DC
  Administered 2014-08-29 – 2014-09-01 (×4): 17 g via ORAL
  Filled 2014-08-29 (×4): qty 1

## 2014-08-29 MED ORDER — PANTOPRAZOLE SODIUM 40 MG PO TBEC
40.0000 mg | DELAYED_RELEASE_TABLET | Freq: Every day | ORAL | Status: DC
  Administered 2014-08-30 – 2014-09-01 (×3): 40 mg via ORAL
  Filled 2014-08-29 (×3): qty 1

## 2014-08-29 MED ORDER — SODIUM CHLORIDE 0.9 % IV BOLUS (SEPSIS)
1000.0000 mL | Freq: Once | INTRAVENOUS | Status: AC
  Administered 2014-08-29: 1000 mL via INTRAVENOUS

## 2014-08-29 MED ORDER — NITROGLYCERIN 0.4 MG SL SUBL: 0.4000 mg | SUBLINGUAL_TABLET | SUBLINGUAL | Status: DC | PRN

## 2014-08-29 MED ORDER — CARVEDILOL 3.125 MG PO TABS
6.2500 mg | ORAL_TABLET | Freq: Two times a day (BID) | ORAL | Status: DC
Start: 2014-08-29 — End: 2014-09-01
  Administered 2014-08-29 – 2014-09-01 (×5): 6.25 mg via ORAL
  Filled 2014-08-29 (×6): qty 2

## 2014-08-29 MED ORDER — TAMSULOSIN HCL 0.4 MG PO CAPS
0.4000 mg | ORAL_CAPSULE | Freq: Every day | ORAL | Status: DC
  Administered 2014-08-29 – 2014-08-31 (×2): 0.4 mg via ORAL
  Filled 2014-08-29 (×3): qty 1

## 2014-08-29 MED ORDER — SODIUM CHLORIDE 0.9 % IV SOLN
INTRAVENOUS | Status: DC
  Administered 2014-08-29: 15:00:00 via INTRAVENOUS

## 2014-08-29 MED ORDER — PRAVASTATIN SODIUM 10 MG PO TABS
20.0000 mg | ORAL_TABLET | Freq: Every evening | ORAL | Status: DC
  Administered 2014-08-29 – 2014-08-31 (×2): 20 mg via ORAL
  Filled 2014-08-29 (×3): qty 2

## 2014-08-29 MED ORDER — ALBUTEROL SULFATE (2.5 MG/3ML) 0.083% IN NEBU: 3.0000 mL | INHALATION_SOLUTION | Freq: Four times a day (QID) | RESPIRATORY_TRACT | Status: DC | PRN

## 2014-08-29 MED ORDER — PANTOPRAZOLE SODIUM 40 MG PO TBEC: 40.0000 mg | DELAYED_RELEASE_TABLET | Freq: Every day | ORAL | Status: DC

## 2014-08-29 MED ORDER — CETYLPYRIDINIUM CHLORIDE 0.05 % MT LIQD
7.0000 mL | Freq: Two times a day (BID) | OROMUCOSAL | Status: DC
  Administered 2014-08-29 – 2014-09-01 (×6): 7 mL via OROMUCOSAL

## 2014-08-29 MED ORDER — GADOBENATE DIMEGLUMINE 529 MG/ML IV SOLN: 14.0000 mL | Freq: Once | INTRAVENOUS | Status: DC | PRN

## 2014-08-29 MED ORDER — CLOPIDOGREL BISULFATE 75 MG PO TABS
75.0000 mg | ORAL_TABLET | Freq: Every evening | ORAL | Status: DC
  Administered 2014-08-29 – 2014-08-31 (×2): 75 mg via ORAL
  Filled 2014-08-29 (×3): qty 1

## 2014-08-29 MED ORDER — SODIUM CHLORIDE 0.9 % IV SOLN
INTRAVENOUS | Status: DC
  Administered 2014-08-29 – 2014-09-01 (×8): via INTRAVENOUS

## 2014-08-29 MED ORDER — FINASTERIDE 5 MG PO TABS
5.0000 mg | ORAL_TABLET | Freq: Every evening | ORAL | Status: DC
  Administered 2014-08-31: 5 mg via ORAL
  Filled 2014-08-29 (×5): qty 1

## 2014-08-29 MED ORDER — ENSURE COMPLETE PO LIQD: 237.0000 mL | Freq: Two times a day (BID) | ORAL | Status: DC

## 2014-08-29 NOTE — H&P (Signed)
[facility] History and Physical  [name] ZOX:[id number] DOB: 08-Jan-1941 DOA: 08/29/2014  Referring physician: ER PCP: [name], MD   Chief Complaint: Weakness  HPI: [name] is a 74 y.o. male  This is a 74 year old man who has had a recent diagnosis of metastatic cancer, primary unclear but most likely is coming from the lung, presents with one-week history of increasing weakness with inability to walk. He has also had poor by mouth intake for the last 2 weeks, versus a no intake for the last couple of days. He has become so weak that he cannot even get himself out of the bed and has been incontinent of stool in his bed. According to his wife, he is also been somewhat confused in the last week or so. He saw the oncologist a few days ago but cannot remember the conversation. He has been given analgesia by his primary care physician for pain in his back. He has been discovered to have radiological evidence of metastatic disease in his bones, liver and spleen. Evaluation in the emergency room today showed no metastatic disease in his brain via MRI scanning. He is now being admitted for further management.  Review of Systems:  Apart from the symptoms above, all systems negative.  Past Medical History  Diagnosis Date  . Hyperlipidemia   . Hypertension   . OAD (obstructive airway disease)   . GERD (gastroesophageal reflux disease)   . Diverticulosis   . ED (erectile dysfunction)     no relief with cialis, etc  . Back pain     per [facility]  . Thyroid nodule   . Anemia   . Irregular heartbeat   . Peripheral vascular disease   . Bronchitis   . AAA (abdominal aortic aneurysm)   . Emphysema     "has a little bit" (07/16/2013)  . Pneumonia     "twice since 1959; once when he was a child" (07/16/2013)  . Exertional shortness of breath   . DDD (degenerative disc disease)     cervical, followed by [facility] 2012  . Bone lesion     L3 on MRI/bone scan 08/2014   Past Surgical  History  Procedure Laterality Date  . Small intestine surgery      "blockage" (07/16/2013)  . Skin graft Left     "hand"  . Tonsillectomy    . Colonoscopy w/ polypectomy    . Aortogram  Aug. 1, 2013  . Colectomy  1961  . Iliac artery stent Right 02/2012    "Dr. Bridgett Larsson"  . Cholecystectomy N/A 09/20/2013    Procedure: LAPAROSCOPIC CHOLECYSTECTOMY WITH INTRAOPERATIVE CHOLANGIOGRAM;  Surgeon: Joyice Faster. Cornett, MD;  Location: [place];  Service: General;  Laterality: N/A;  . Abdominal aortagram N/A 02/02/2012    Procedure: ABDOMINAL AORTAGRAM;  Surgeon: Conrad [place] Wildwood, MD;  Location: [facility] CATH LAB;  Service: Cardiovascular;  Laterality: N/A;  . Left heart catheterization with coronary angiogram N/A 09/11/2013    Procedure: LEFT HEART CATHETERIZATION WITH CORONARY ANGIOGRAM;  Surgeon: Josue Hector, MD;  Location: [facility] CATH LAB;  Service: Cardiovascular;  Laterality: N/A;   Social History:  reports that he quit smoking about 32 years ago. His smoking use included Cigarettes. He has a 90 pack-year smoking history. He has never used smokeless tobacco. He reports that he does not drink alcohol or use illicit drugs.  Allergies  Allergen Reactions  . Hydrochlorothiazide Other (See Comments)    REACTION: joint pain  . Benazepril Hcl Other (  See Comments)    unknown    Family History  Problem Relation Age of Onset  . Heart attack Mother   . Heart disease Mother   . Hyperlipidemia Mother   . Stroke Father   . Heart attack Father   . Heart disease Father   . Hyperlipidemia Father   . Hypertension Father   . Other Father     varicose veins  . Heart attack Brother   . Prostate cancer Brother   . Cancer Brother     lung  . Other Brother     varicose veins  . Heart attack Brother   . Heart disease Brother   . Hyperlipidemia Brother   . Other Brother     varicose veins  . Breast cancer Sister   . Diabetes Sister   . Cancer Sister     stomach  . Heart disease Brother   . Hyperlipidemia Brother     . Hypertension Brother   . Heart attack Brother     Prior to Admission medications   Medication Sig Start Date End Date Taking? Authorizing Provider  carvedilol (COREG) 6.25 MG tablet Take 1 tablet (6.25 mg total) by mouth 2 (two) times daily. 09/13/13  Yes Josue Hector, MD  finasteride (PROSCAR) 5 MG tablet Take 5 mg by mouth every evening.  05/21/12  Yes Historical Provider, MD  isosorbide mononitrate (IMDUR) 30 MG 24 hr tablet Take 1 tablet (30 mg total) by mouth daily. 09/13/13  Yes Josue Hector, MD  omeprazole (PRILOSEC) 20 MG capsule TAKE ONE CAPSULE BY MOUTH ONCE DAILY 06/04/14  Yes Tonia Ghent, MD  oxyCODONE (ROXICODONE) 5 MG immediate release tablet Take 1-2 tablets (5-10 mg total) by mouth every 6 (six) hours as needed for severe pain (sedation caution). 08/18/14  Yes Tonia Ghent, MD  polyethylene glycol [facility] / Floria Raveling) packet Take 17 g by mouth daily.   Yes Historical Provider, MD  pravastatin (PRAVACHOL) 40 MG tablet Take 0.5 tablets (20 mg total) by mouth every evening. Takes half tab daily Patient taking differently: Take 20 mg by mouth every evening.  09/30/13  Yes Tonia Ghent, MD  tamsulosin (FLOMAX) 0.4 MG CAPS capsule Take 0.4 mg by mouth daily after supper.   Yes Historical Provider, MD  aspirin 81 MG chewable tablet Chew 1 tablet (81 mg total) by mouth daily. 09/23/13   Melton Alar, PA-C  clopidogrel (PLAVIX) 75 MG tablet Take 1 tablet (75 mg total) by mouth every evening. 04/28/14   Conrad Rainier, MD  nitroGLYCERIN (NITROSTAT) 0.4 MG SL tablet Place 1 tablet (0.4 mg total) under the tongue every 5 (five) minutes as needed for chest pain. 11/20/13   Josue Hector, MD  VENTOLIN HFA 108 (90 BASE) MCG/ACT inhaler Inhale 2 puffs into the lungs every 6 (six) hours as needed for wheezing or shortness of breath.  09/16/13   Historical Provider, MD   Physical Exam: Filed Vitals:   08/29/14 1200 08/29/14 1230 08/29/14 1353 08/29/14 1430  BP: 162/78 144/118 171/84  174/85  Pulse: 97 92 94 94  Temp:      TempSrc:      Resp: 15 20 18 17   Height:      Weight:      SpO2: 99% 99% 98% 100%    Wt Readings from Last 3 Encounters:  08/29/14 68.04 kg (150 lb)  08/29/14 68.04 kg (150 lb)  08/25/14 70.081 kg (154 lb 8 oz)  General:  Appears calm and comfortable. He is clinically dehydrated. He is somewhat confused.  Eyes: PERRL, normal lids, irises & conjunctiva ENT: grossly normal hearing, lips & tongue Neck: no LAD, masses or thyromegaly Cardiovascular: RRR, no m/r/g. No LE edema. Telemetry: SR, no arrhythmias  Respiratory: CTA bilaterally, no w/r/r. Normal respiratory effort. Abdomen: soft, ntnd Skin: no rash or induration seen on limited exam Musculoskeletal: grossly normal tone BUE/BLE Psychiatric: grossly normal mood and affect, speech fluent and appropriate Neurologic: grossly non-focal. no clear localizing neurological signs.           Labs on Admission:  Basic Metabolic Panel:  Recent Labs Lab 08/25/14 1452 08/29/14 1035  NA 135* 140  K 4.5 4.0  CL  --  104  CO2 23 29  GLUCOSE 125 145*  BUN 20.2 29*  CREATININE 1.1 0.81  CALCIUM 9.4 9.2   Liver Function Tests:  Recent Labs Lab 08/25/14 1452  AST 20  ALT 23  ALKPHOS 103  BILITOT 2.25*  PROT 6.3*  ALBUMIN 2.5*   No results for input(s): LIPASE, AMYLASE in the last 168 hours. No results for input(s): AMMONIA in the last 168 hours. CBC:  Recent Labs Lab 08/25/14 1452 08/29/14 1035  WBC 7.8 7.8  NEUTROABS 6.1  --   HGB 11.9* 12.3*  HCT 36.7* 37.5*  MCV 82.5 84.5  PLT 269 275   Cardiac Enzymes:  Recent Labs Lab 08/29/14 1035  TROPONINI <0.03    BNP (last 3 results) No results for input(s): BNP in the last 8760 hours.  ProBNP (last 3 results)  Recent Labs  09/17/13 1459  PROBNP 756.4*    CBG: No results for input(s): GLUCAP in the last 168 hours.  Radiological Exams on Admission: Dg Chest 1 View  08/29/2014   CLINICAL DATA:  Acute  bronchitis.  Hypertension.  EXAM: CHEST  1 VIEW  COMPARISON:  August 18, 2014 chest radiograph and chest CT August 20, 2014  FINDINGS: A monitor lead obscures a known mass in the right upper lobe near the apex. Elsewhere, there is no edema or consolidation. Heart size and pulmonary vascularity are normal. No adenopathy. No bone lesions.  IMPRESSION: Known nodular lesion right upper lobe is obscured currently by an overlying monitor lead. No edema or consolidation.   Electronically Signed   By: Lowella Grip III M.D.   On: 08/29/2014 12:20   Mr Jeri Cos EV Contrast  08/29/2014   CLINICAL DATA:  Increasing weakness and confusion. Metastatic malignancy, possibly primary lung neoplasm.  EXAM: MRI HEAD WITHOUT AND WITH CONTRAST  TECHNIQUE: Multiplanar, multiecho pulse sequences of the brain and surrounding structures were obtained without and with intravenous contrast.  CONTRAST:  14 mL MultiHance  COMPARISON:  Head CT 09/17/2013  FINDINGS: There is no evidence of acute infarct, mass, midline shift, or extra-axial fluid collection. There may be a punctate focus of chronic microhemorrhage in the left frontal lobe adjacent to the frontal horn of lateral ventricle. There is mild-to-moderate generalized cerebral atrophy. Confluent periventricular white matter T2 hyperintensities are nonspecific but compatible with mild to moderate chronic small vessel ischemic disease. There is no abnormal enhancement.  Orbits are unremarkable. Paranasal sinuses and mastoid air cells are clear. Abnormal distal intracranial right vertebral artery flow void may reflect abnormal flow due to atherosclerotic disease and stenosis. Other major intracranial vascular flow voids are grossly preserved.  IMPRESSION: 1. No evidence of acute intracranial abnormality or metastases. 2. Mild-to-moderate chronic small vessel ischemic disease and cerebral atrophy.  Electronically Signed   By: Logan Bores   On: 08/29/2014 14:10       Assessment/Plan   1. Dehydration. He has had very poor by mouth intake in the last couple weeks. He will be treated with intravenous fluids, encouraged to eat. 2. Weakness. He does not appear to have any focal neurological signs and I think this is part of his general decline from his malignancy. We will ask physical therapy to evaluate him. 3. Metastatic disease. The primary is unclear. He is supposed to have a bone biopsy done early next week at [facility]. 4. Hypertension, stable.   Further recommendations will depend on patient's hospital progress.  Code Status:DO NOT RESUSCITATE. This was confirmed with the patiet's wife.  DVT Prophylaxis: heparin.   Family Communication:I discussed at length with the patient's wife likely diagnosis and prognosis. She completely understands the situation and realizes that he may decline sooner rather than later.  Disposition Plan:depending on progress.  Time spent: 60 minutes.  Doree Albee [facility] Pager [phone]

## 2014-08-29 NOTE — ED Notes (Signed)
Pt placed on 1liters [place]. o2 saturation 96% or greater. nad noted.

## 2014-08-29 NOTE — Telephone Encounter (Signed)
Agreed, 911/ER.  Thanks for the info.

## 2014-08-29 NOTE — Telephone Encounter (Signed)
Patient Name: DYKE WEIBLE DOB: [birth year] Initial Comment Caller States husband has cancer. he is out of it and not making sense when he is talking...more so of the past. cant get him up out of the bed for 3 days, has been talking about that he is gonna die in his bed, can not walk or stand up. has been eating very little to almost none at all, he is drinking very little. last night he choked on some phlegm. when she tries to get him out of bed and gets a leg off he says it is in pain and tensions up. Nurse Assessment Nurse: Marcelline Deist, RN, Lynda Date/Time (Eastern Time): 08/29/2014 8:47:10 AM Confirm and document reason for call. If symptomatic, describe symptoms. ---Caller states husband has cancer - bones, stomach, lungs, pelvic area, hip & spleen. He is out of it and not making sense when he is talking...more so of the past. cant get him up out of the bed for 3 days, has been talking about that he is gonna die in his bed, can not walk or stand up. has been eating very little to almost none at all, he is drinking very little. last night he choked on some phlegm. when she tries to get him out of bed and gets a leg off he says it is in pain and tenses up. Has the patient traveled out of the country within the last 30 days? ---Not Applicable Does the patient require triage? ---Yes Related visit to physician within the last 2 weeks? ---Yes Does the PT have any chronic conditions? (i.e. diabetes, asthma, etc.) ---Yes List chronic conditions. ---cancer in multiple sites, blockages in his heart Guidelines Guideline Title Affirmed Question Affirmed Notes Confusion - Delirium [1] Difficult to awaken or acting confused (disoriented, slurred speech) AND [2] present now AND [3] new onset Final Disposition User Call EMS 911 Now [place], Therapist, sports, [employer]

## 2014-08-29 NOTE — ED Provider Notes (Signed)
This chart was scribed for [place], DO by Einar Pheasant, ED Scribe. This patient was seen in room APA06/APA06 and the patient's care was started at 11:00 AM.  TIME SEEN: 11:00 AM  CHIEF COMPLAINT: Fatigue  HPI:  HPI Comments: [name] is a 74 y.o. male with history of hypertension, hyperlipidemia, recent diagnosis of cancer without an obvious primary source who presents to the Emergency Department complaining of progressively increasing weakness this week. Wife states that she has been having a hard time getting the pt up form the bed. She states that he has cancer to several part of his body but the source of CA has not been determined yet. Wife states that he went to his PCP complaining of back pain which they thought was due to sciatica, however, after treatment the pain did not resolved. After an MRI they discovered a spot in his back, so they sent him out for further imaging/testing. He was found to have multiple bony metastatic lesions with a possible source from his lungs. He is scheduled to have an MRI of his brain and a PET scan in the beginning of March. Dr. Inda Merlin is his oncologist. He is not yet started chemotherapy or radiation. Wife is also complaining of intermittent confusion, he told her several things that were not true. She is also states that he has been having trouble speaking "like his tongue is gets in his way". Pt takes percocet for pain. No other new medications. Denies recent fever, cough, vomiting, diarrhea. Denies chest pain or shortness of breath. Denies numbness or focal weakness. Patient's wife reports that he has not been able to get out of bed for the past week and has been eating, pooping in the bed.   ROS: See HPI Constitutional: no fever; positive fatigue Eyes: no drainage  ENT: no runny nose   Cardiovascular:  no chest pain  Resp: no SOB  GI: no vomiting GU: no dysuria Integumentary: no rash  Allergy: no hives  Musculoskeletal: no leg  swelling  Neurological: no slurred speech ROS otherwise negative  PAST MEDICAL HISTORY/PAST SURGICAL HISTORY:  Past Medical History  Diagnosis Date  . Hyperlipidemia   . Hypertension   . OAD (obstructive airway disease)   . GERD (gastroesophageal reflux disease)   . Diverticulosis   . ED (erectile dysfunction)     no relief with cialis, etc  . Back pain     per [facility]  . Thyroid nodule   . Anemia   . Irregular heartbeat   . Peripheral vascular disease   . Bronchitis   . AAA (abdominal aortic aneurysm)   . Emphysema     "has a little bit" (07/16/2013)  . Pneumonia     "twice since 1959; once when he was a child" (07/16/2013)  . Exertional shortness of breath   . DDD (degenerative disc disease)     cervical, followed by [facility] 2012  . Bone lesion     L3 on MRI/bone scan 08/2014    MEDICATIONS:  Prior to Admission medications   Medication Sig Start Date End Date Taking? Authorizing Provider  aspirin 81 MG chewable tablet Chew 1 tablet (81 mg total) by mouth daily. 09/23/13   Bobby Rumpf [place], PA-C  carvedilol (COREG) 6.25 MG tablet Take 1 tablet (6.25 mg total) by mouth 2 (two) times daily. 09/13/13   Josue Hector, MD  clopidogrel (PLAVIX) 75 MG tablet Take 1 tablet (75 mg total) by mouth every evening. 04/28/14  Conrad Gore, MD  finasteride (PROSCAR) 5 MG tablet Take 5 mg by mouth every evening.  05/21/12   Historical Provider, MD  isosorbide mononitrate (IMDUR) 30 MG 24 hr tablet Take 1 tablet (30 mg total) by mouth daily. 09/13/13   Josue Hector, MD  nitroGLYCERIN (NITROSTAT) 0.4 MG SL tablet Place 1 tablet (0.4 mg total) under the tongue every 5 (five) minutes as needed for chest pain. Patient not taking: Reported on 08/25/2014 11/20/13   Josue Hector, MD  omeprazole (PRILOSEC) 20 MG capsule TAKE ONE CAPSULE BY MOUTH ONCE DAILY 06/04/14   Tonia Ghent, MD  oxyCODONE (ROXICODONE) 5 MG immediate release tablet Take 1-2 tablets (5-10 mg total) by mouth every 6 (six) hours as  needed for severe pain (sedation caution). 08/18/14   Tonia Ghent, MD  polyethylene glycol [facility] / Floria Raveling) packet Take 17 g by mouth daily.    Historical Provider, MD  pravastatin (PRAVACHOL) 40 MG tablet Take 0.5 tablets (20 mg total) by mouth every evening. Takes half tab daily 09/30/13   Tonia Ghent, MD  tamsulosin (FLOMAX) 0.4 MG CAPS capsule Take 0.4 mg by mouth daily after supper.    Historical Provider, MD  VENTOLIN HFA 108 (90 BASE) MCG/ACT inhaler  09/16/13   Historical Provider, MD    ALLERGIES:  Allergies  Allergen Reactions  . Hydrochlorothiazide Other (See Comments)    REACTION: joint pain  . Benazepril Hcl Other (See Comments)    unknown    SOCIAL HISTORY:  History  Substance Use Topics  . Smoking status: Former Smoker -- 3.00 packs/day for 30 years    Types: Cigarettes    Quit date: 02/28/1982  . Smokeless tobacco: Never Used  . Alcohol Use: No     Comment: 07/16/2013 "used to drink a couple beers/night; stopped  02/2012"    FAMILY HISTORY: Family History  Problem Relation Age of Onset  . Heart attack Mother   . Heart disease Mother   . Hyperlipidemia Mother   . Stroke Father   . Heart attack Father   . Heart disease Father   . Hyperlipidemia Father   . Hypertension Father   . Other Father     varicose veins  . Heart attack Brother   . Prostate cancer Brother   . Cancer Brother     lung  . Other Brother     varicose veins  . Heart attack Brother   . Heart disease Brother   . Hyperlipidemia Brother   . Other Brother     varicose veins  . Breast cancer Sister   . Diabetes Sister   . Cancer Sister     stomach  . Heart disease Brother   . Hyperlipidemia Brother   . Hypertension Brother   . Heart attack Brother     EXAM: BP 144/77 mmHg  Pulse 108  Temp(Src) 97.9 F (36.6 C) (Oral)  Resp 18  Ht 5\' 8"  (1.727 m)  Wt 150 lb (68.04 kg)  BMI 22.81 kg/m2  SpO2 99% CONSTITUTIONAL: Alert and oriented and responds appropriately to  questions. Elderly; chronically ill appearing but not in any distress;  appears dehydrated HEAD: Normocephalic EYES: Conjunctivae clear, PERRL ENT: normal nose; no rhinorrhea; very dry mucous membranes; pharynx without lesions noted NECK: Supple, no meningismus, no LAD  CARD: Regular and tachycardic; S1 and S2 appreciated; no murmurs, no clicks, no rubs, no gallops RESP: Normal chest excursion without splinting or tachypnea; breath sounds clear and equal bilaterally;  no wheezes, no rhonchi, no rales,  ABD/GI: Normal bowel sounds; non-distended; soft, non-tender, no rebound, no guarding BACK:  The back appears normal and is non-tender to palpation, there is no CVA tenderness EXT: Normal ROM in all joints; non-tender to palpation; no edema; normal capillary refill; no cyanosis    SKIN: Normal color for age and race; warm NEURO: Moves all extremities equally; sensation to light tough diffusely intact; cranial nerves 2-12 grossly intact. PSYCH: The patient's mood and manner are appropriate. Grooming and personal hygiene are appropriate.  MEDICAL DECISION MAKING: Patient here with generalized weakness. He does appear dehydrated on exam. Discussed with wife that his symptoms may be due to his metastatic cancer and progressive worsening. Will obtain screening urine, chest x-ray, MRI of his brain to evaluate for possible causes of his symptoms. We'll give IV fluids. We'll give pain medicine for his bony pain.  ED PROGRESS:  Labs show no acute abnormality. Urine shows ketones but no sign of infection. Troponin negative. MRI of the brain shows no acute intracranial abnormality or metastasis. Chest x-ray shows no new infiltrate. There are no new nodular lesion seen on chest x-ray. Patient still unable to get up out of bed despite multiple attempts. Discussed with Dr. Anastasio Champion for admission for dehydration, pain control, rehabilitation and likely placement. Patient agrees to placement in nursing facility if he  does not improve with IV hydration.  PCP is with [facility]   EKG Interpretation  Date/Time:  Friday August 29 2014 10:29:01 EST Ventricular Rate:  106 PR Interval:  140 QRS Duration: 87 QT Interval:  350 QTC Calculation: 465 R Axis:   52 Text Interpretation:  Sinus tachycardia Baseline wander in lead(s) II No significant change since last tracing Confirmed by Rossie Bretado,  DO, Dontavious Emily ([id number]) on 08/29/2014 10:47:06 AM      I personally performed the services described in this documentation, which was scribed in my presence. The recorded information has been reviewed and is accurate.    [place], DO 08/29/14 1624

## 2014-08-29 NOTE — ED Notes (Signed)
Per [facility], pt has been getting progressively weaker with intermittent confusion over last several weeks. Pt alert to self but disoriented to place and time.

## 2014-08-30 DIAGNOSIS — R531 Weakness: Secondary | ICD-10-CM

## 2014-08-30 DIAGNOSIS — E43 Unspecified severe protein-calorie malnutrition: Secondary | ICD-10-CM | POA: Insufficient documentation

## 2014-08-30 LAB — CBC
HCT: 33.4 % — ABNORMAL LOW (ref 39.0–52.0)
HEMOGLOBIN: 10.5 g/dL — AB (ref 13.0–17.0)
MCH: 26.9 pg (ref 26.0–34.0)
MCHC: 31.4 g/dL (ref 30.0–36.0)
MCV: 85.6 fL (ref 78.0–100.0)
PLATELETS: 183 10*3/uL (ref 150–400)
RBC: 3.9 MIL/uL — ABNORMAL LOW (ref 4.22–5.81)
RDW: 13.8 % (ref 11.5–15.5)
WBC: 5 10*3/uL (ref 4.0–10.5)

## 2014-08-30 LAB — COMPREHENSIVE METABOLIC PANEL
ALT: 19 U/L (ref 0–53)
AST: 21 U/L (ref 0–37)
Albumin: 2.3 g/dL — ABNORMAL LOW (ref 3.5–5.2)
Alkaline Phosphatase: 78 U/L (ref 39–117)
Anion gap: 3 — ABNORMAL LOW (ref 5–15)
BUN: 17 mg/dL (ref 6–23)
CO2: 27 mmol/L (ref 19–32)
Calcium: 8.5 mg/dL (ref 8.4–10.5)
Chloride: 110 mmol/L (ref 96–112)
Creatinine, Ser: 0.7 mg/dL (ref 0.50–1.35)
GFR calc non Af Amer: >90 mL/min (ref >90)
GLUCOSE: 113 mg/dL — AB (ref 70–99)
POTASSIUM: 3.7 mmol/L (ref 3.5–5.1)
Sodium: 140 mmol/L (ref 135–145)
TOTAL PROTEIN: 5.6 g/dL — AB (ref 6.0–8.3)
Total Bilirubin: 1.4 mg/dL — ABNORMAL HIGH (ref 0.3–1.2)

## 2014-08-30 MED ORDER — MORPHINE SULFATE (CONCENTRATE) 10 MG/0.5ML PO SOLN
10.0000 mg | ORAL | Status: DC | PRN
  Administered 2014-08-30 – 2014-09-01 (×5): 10 mg via ORAL
  Filled 2014-08-30 (×5): qty 0.5

## 2014-08-30 MED ORDER — ENSURE COMPLETE PO LIQD
237.0000 mL | Freq: Three times a day (TID) | ORAL | Status: DC
  Administered 2014-08-30 – 2014-09-01 (×6): 237 mL via ORAL

## 2014-08-30 NOTE — Progress Notes (Signed)
INITIAL NUTRITION ASSESSMENT Pt meets criteria for SEVERE MALNUTRITION in the context of Chronic Illness as evidenced by weight loss of >7.5% bw in 3 months and an intake that meets <75% of estimated energy needs for >1 month. DOCUMENTATION CODES Per approved criteria  -Severe malnutrition in the context of chronic illness   INTERVENTION: -Ensure Complete po BID, each supplement provides 350 kcal and 13 grams of protein  -Recommend MVI in light of prolonged poor PO intake  NUTRITION DIAGNOSIS: Inadequate oral intake related to confusion/lack of appetite as evidenced by a 27# weight loss in 2 months.   Goal: Pt to meet >/= 90% of their estimated nutrition needs   Monitor:  Intake, weight, goals of care, labs  Reason for Assessment: MST of 4  74 y.o. male  Admitting Dx: <principal problem not specified>  ASSESSMENT: 74 year old man who has had a recent diagnosis of metastatic cancer, presents with one-week history of increasing weakness with inability to walk. He has also had poor by mouth intake for the last 2 weeks, versus  no intake for the last couple of days.   Per notes: Pt has had very poor intake last few weeks and as a result has become dehydrated. He has also become increasingly weak. Both symptoms are believed to be due progression of his cancer.  Height: Ht Readings from Last 1 Encounters:  08/29/14 5\' 8"  (1.727 m)    Weight: Wt Readings from Last 1 Encounters:  08/29/14 154 lb 6.4 oz (70.035 kg)    Ideal Body Weight: 154  % Ideal Body Weight: 100  Wt Readings from Last 10 Encounters:  08/29/14 154 lb 6.4 oz (70.035 kg)  08/29/14 150 lb (68.04 kg)  08/25/14 154 lb 8 oz (70.081 kg)  07/15/14 181 lb 8 oz (82.328 kg)  06/20/14 185 lb 12 oz (84.256 kg)  06/06/14 187 lb (84.823 kg)  05/20/14 187 lb (84.823 kg)  03/18/14 187 lb 12 oz (85.163 kg)  11/22/13 187 lb (84.823 kg)  11/20/13 187 lb 3.2 oz (84.913 kg)    Usual Body Weight: appears to be ~185  %  Usual Body Weight: 83%  BMI:  Body mass index is 23.48 kg/(m^2).  Estimated Nutritional Needs: Kcal: >2100 (30 kcal/kg) Protein: 102-140 g (1.5-2g/kg) Fluid: > 2.1 liters  Skin: Dry  Diet Order: Diet regular  EDUCATION NEEDS: -No education needs identified at this time   Intake/Output Summary (Last 24 hours) at 08/30/14 0802 Last data filed at 08/29/14 1800  Gross per 24 hour  Intake      0 ml  Output      0 ml  Net      0 ml    Last BM: 2/23   Labs:   Recent Labs Lab 08/25/14 1452 08/29/14 1035 08/30/14 0607  NA 135* 140 140  K 4.5 4.0 3.7  CL  --  104 110  CO2 23 29 27   BUN 20.2 29* 17  CREATININE 1.1 0.81 0.70  CALCIUM 9.4 9.2 8.5  GLUCOSE 125 145* 113*    CBG (last 3)  No results for input(s): GLUCAP in the last 72 hours.  Scheduled Meds: . antiseptic oral rinse  7 mL Mouth Rinse BID  . aspirin  81 mg Oral Daily  . carvedilol  6.25 mg Oral BID WC  . clopidogrel  75 mg Oral QPM  . feeding supplement (ENSURE COMPLETE)  237 mL Oral BID BM  . finasteride  5 mg Oral QPM  . heparin  5,000  Units Subcutaneous 3 times per day  . isosorbide mononitrate  30 mg Oral Daily  . pantoprazole  40 mg Oral Daily  . polyethylene glycol  17 g Oral Daily  . pravastatin  20 mg Oral QPM  . tamsulosin  0.4 mg Oral QPC supper    Continuous Infusions: . sodium chloride 100 mL/hr at 08/30/14 0740    Past Medical History  Diagnosis Date  . Hyperlipidemia   . Hypertension   . OAD (obstructive airway disease)   . GERD (gastroesophageal reflux disease)   . Diverticulosis   . ED (erectile dysfunction)     no relief with cialis, etc  . Back pain     per [facility]  . Thyroid nodule   . Anemia   . Irregular heartbeat   . Peripheral vascular disease   . Bronchitis   . AAA (abdominal aortic aneurysm)   . Emphysema     "has a little bit" (07/16/2013)  . Pneumonia     "twice since 1959; once when he was a child" (07/16/2013)  . Exertional shortness of breath   . DDD  (degenerative disc disease)     cervical, followed by [facility] 2012  . Bone lesion     L3 on MRI/bone scan 08/2014  . Cancer     Past Surgical History  Procedure Laterality Date  . Small intestine surgery      "blockage" (07/16/2013)  . Skin graft Left     "hand"  . Tonsillectomy    . Colonoscopy w/ polypectomy    . Aortogram  Aug. 1, 2013  . Colectomy  1961  . Iliac artery stent Right 02/2012    "Dr. Bridgett Larsson"  . Cholecystectomy N/A 09/20/2013    Procedure: LAPAROSCOPIC CHOLECYSTECTOMY WITH INTRAOPERATIVE CHOLANGIOGRAM;  Surgeon: Joyice Faster. Cornett, MD;  Location: [place];  Service: General;  Laterality: N/A;  . Abdominal aortagram N/A 02/02/2012    Procedure: ABDOMINAL AORTAGRAM;  Surgeon: Conrad Northwood, MD;  Location[facility] CATH LAB;  Service: Cardiovascular;  Laterality: N/A;  . Left heart catheterization with coronary angiogram N/A 09/11/2013    Procedure: LEFT HEART CATHETERIZATION WITH CORONARY ANGIOGRAM;  Surgeon: Josue Hector, MD;  Location: [facility] CATH LAB;  Service: Cardiovascular;  Laterality: N/A;    Burtis Junes RD, LDN Nutrition Pager: [phone] 08/30/2014 8:02 AM

## 2014-08-30 NOTE — Plan of Care (Signed)
Problem: Phase I Progression Outcomes Goal: OOB as tolerated unless otherwise ordered Outcome: Not Met (add Reason) Cancer pain

## 2014-08-30 NOTE — Progress Notes (Signed)
Patient noted having a hard time swallowing PO pills. He required repeated promptly to swallow and suck on his straw. MD notified.

## 2014-08-30 NOTE — Progress Notes (Signed)
[facility] PROGRESS NOTE  [name] DOB: 03-Apr-1941 DOA: 08/29/2014 PCP: [name], MD  Assessment/Plan: Metastatic Cancer of Unknown Primary -Patient has deteriorated rapidly in 2 months and even quicker in the past week. -Discussed at length with wife at bedside. -She is no longer able to care for him at home. -He has become more lethargic and confused and is unable to answer questions appropriately. -He has significant pain and moans constantly especially with movement. -Was scheduled for a bone biopsy next week. Believe this is futile as do not believe he will be able to withstand treatment for his metastatic disease. -Discussed hospice care with wife and she agrees. -She would like to think about home with hospice vs SNF with hospice vs residential hospice. -Will ask for a palliative care evaluation. -Will discuss with Dr. Julien Nordmann on Monday. -Roxanol PRN for pain.  Generalized Weakness/Dehydration/Severe Malnutrition -2/2 above.  Code Status: DNR as discussed with wife Family Communication: Wife at bedside  Disposition Plan: To be determined   Consultants:  Palliative care   Antibiotics:  None   Subjective: Not alert. Moaning constantly  Objective: Filed Vitals:   08/29/14 1813 08/29/14 2027 08/30/14 0625 08/30/14 1503  BP: 164/73 141/64 156/78 131/49  Pulse: 102 113 96 104  Temp: 98.1 F (36.7 C) 97.9 F (36.6 C) 97.8 F (36.6 C) 98.6 F (37 C)  TempSrc: Oral Oral Oral Oral  Resp: 20 20 20 16   Height: 5\' 8"  (1.727 m)     Weight: 70.035 kg (154 lb 6.4 oz)     SpO2: 96% 94% 94% 94%    Intake/Output Summary (Last 24 hours) at 08/30/14 1832 Last data filed at 08/30/14 1500  Gross per 24 hour  Intake 733.33 ml  Output      0 ml  Net 733.33 ml   Filed Weights   08/29/14 1019 08/29/14 1813  Weight: 68.04 kg (150 lb) 70.035 kg (154 lb 6.4 oz)    Exam:   General:  lethargic  Cardiovascular: RRR  Respiratory:  coarse bilateral breath sounds  Abdomen: S/NT/ND/+BS  Extremities: 1+edema bilaterally   Neurologic:  Unable to fully assess given current mental state.  Data Reviewed: Basic Metabolic Panel:  Recent Labs Lab 08/25/14 1452 08/29/14 1035 08/30/14 0607  NA 135* 140 140  K 4.5 4.0 3.7  CL  --  104 110  CO2 23 29 27   GLUCOSE 125 145* 113*  BUN 20.2 29* 17  CREATININE 1.1 0.81 0.70  CALCIUM 9.4 9.2 8.5   Liver Function Tests:  Recent Labs Lab 08/25/14 1452 08/30/14 0607  AST 20 21  ALT 23 19  ALKPHOS 103 78  BILITOT 2.25* 1.4*  PROT 6.3* 5.6*  ALBUMIN 2.5* 2.3*   No results for input(s): LIPASE, AMYLASE in the last 168 hours. No results for input(s): AMMONIA in the last 168 hours. CBC:  Recent Labs Lab 08/25/14 1452 08/29/14 1035 08/30/14 0607  WBC 7.8 7.8 5.0  NEUTROABS 6.1  --   --   HGB 11.9* 12.3* 10.5*  HCT 36.7* 37.5* 33.4*  MCV 82.5 84.5 85.6  PLT 269 275 183   Cardiac Enzymes:  Recent Labs Lab 08/29/14 1035  TROPONINI <0.03   BNP (last 3 results) No results for input(s): BNP in the last 8760 hours.  ProBNP (last 3 results)  Recent Labs  09/17/13 1459  PROBNP 756.4*    CBG: No results for input(s): GLUCAP in the last 168 hours.  No results found for  this or any previous visit (from the past 240 hour(s)).   Studies: Dg Chest 1 View  08/29/2014   CLINICAL DATA:  Acute bronchitis.  Hypertension.  EXAM: CHEST  1 VIEW  COMPARISON:  August 18, 2014 chest radiograph and chest CT August 20, 2014  FINDINGS: A monitor lead obscures a known mass in the right upper lobe near the apex. Elsewhere, there is no edema or consolidation. Heart size and pulmonary vascularity are normal. No adenopathy. No bone lesions.  IMPRESSION: Known nodular lesion right upper lobe is obscured currently by an overlying monitor lead. No edema or consolidation.   Electronically Signed   By: Lowella Grip III M.D.   On: 08/29/2014 12:20   Mr Jeri Cos FF  Contrast  08/29/2014   CLINICAL DATA:  Increasing weakness and confusion. Metastatic malignancy, possibly primary lung neoplasm.  EXAM: MRI HEAD WITHOUT AND WITH CONTRAST  TECHNIQUE: Multiplanar, multiecho pulse sequences of the brain and surrounding structures were obtained without and with intravenous contrast.  CONTRAST:  14 mL MultiHance  COMPARISON:  Head CT 09/17/2013  FINDINGS: There is no evidence of acute infarct, mass, midline shift, or extra-axial fluid collection. There may be a punctate focus of chronic microhemorrhage in the left frontal lobe adjacent to the frontal horn of lateral ventricle. There is mild-to-moderate generalized cerebral atrophy. Confluent periventricular white matter T2 hyperintensities are nonspecific but compatible with mild to moderate chronic small vessel ischemic disease. There is no abnormal enhancement.  Orbits are unremarkable. Paranasal sinuses and mastoid air cells are clear. Abnormal distal intracranial right vertebral artery flow void may reflect abnormal flow due to atherosclerotic disease and stenosis. Other major intracranial vascular flow voids are grossly preserved.  IMPRESSION: 1. No evidence of acute intracranial abnormality or metastases. 2. Mild-to-moderate chronic small vessel ischemic disease and cerebral atrophy.   Electronically Signed   By: Logan Bores   On: 08/29/2014 14:10    Scheduled Meds: . antiseptic oral rinse  7 mL Mouth Rinse BID  . aspirin  81 mg Oral Daily  . carvedilol  6.25 mg Oral BID WC  . clopidogrel  75 mg Oral QPM  . feeding supplement (ENSURE COMPLETE)  237 mL Oral TID BM  . finasteride  5 mg Oral QPM  . heparin  5,000 Units Subcutaneous 3 times per day  . isosorbide mononitrate  30 mg Oral Daily  . pantoprazole  40 mg Oral Daily  . polyethylene glycol  17 g Oral Daily  . pravastatin  20 mg Oral QPM  . tamsulosin  0.4 mg Oral QPC supper   Continuous Infusions: . sodium chloride 100 mL/hr at 08/30/14 1732    Principal  Problem:   Metastatic disease Active Problems:   Essential hypertension   Lung mass   Dehydration   Protein-calorie malnutrition, severe   Weakness generalized    Time spent: 35 minutes. Greater than 50% of this time was spent in direct contact with the patient coordinating care.    Lelon Frohlich  [facility] Pager [phone]  If 7PM-7AM, please contact night-coverage at [email or web address], password [facility] 08/30/2014, 6:32 PM  LOS: 1 day

## 2014-08-31 NOTE — Progress Notes (Signed)
[facility] PROGRESS NOTE  MAURICE FOTHERINGHAM DGL:[id number] DOB: 10-04-1940 DOA: 08/29/2014 PCP: [name], MD  Assessment/Plan: Metastatic Cancer of Unknown Primary -Patient has deteriorated rapidly in 2 months and even quicker in the past week. -Discussed at length with wife at bedside. -She is no longer able to care for him at home. -He has become more lethargic and confused and is unable to answer questions appropriately. -He has significant pain and moans constantly especially with movement. -Was scheduled for a bone biopsy next week. Believe this is futile as do not believe he will be able to withstand treatment for his metastatic disease. -Discussed hospice care with wife and she agrees. -She would like to think about home with hospice vs SNF with hospice vs residential hospice. (Leaning towards residential hospice today). -Will ask for a palliative care evaluation. -Will discuss with Dr. Julien Nordmann on Monday. -Roxanol PRN for pain.  Generalized Weakness/Dehydration/Severe Malnutrition -2/2 above.  Code Status: DNR as discussed with wife Family Communication: Wife, son and daughter at bedside  Disposition Plan: To be determined   Consultants:  Palliative care   Antibiotics:  None   Subjective: Awake. No insight into current medical condition. Son confirms significant deterioration in past week.  Objective: Filed Vitals:   08/30/14 1503 08/30/14 2114 08/31/14 0619 08/31/14 1458  BP: 131/49 129/60 172/71 115/60  Pulse: 104 99 101 99  Temp: 98.6 F (37 C) 98.1 F (36.7 C) 98.2 F (36.8 C) 98.1 F (36.7 C)  TempSrc: Oral Oral Oral Oral  Resp: 16 16 16 20   Height:      Weight:      SpO2: 94% 94% 94% 94%    Intake/Output Summary (Last 24 hours) at 08/31/14 1809 Last data filed at 08/31/14 5188  Gross per 24 hour  Intake 1563.33 ml  Output   1000 ml  Net 563.33 ml   Filed Weights   08/29/14 1019 08/29/14 1813  Weight: 68.04 kg (150 lb) 70.035  kg (154 lb 6.4 oz)    Exam:   General:  lethargic  Cardiovascular: RRR  Respiratory: coarse bilateral breath sounds  Abdomen: S/NT/ND/+BS  Extremities: 1+edema bilaterally   Neurologic:  Unable to fully assess given current mental state.  Data Reviewed: Basic Metabolic Panel:  Recent Labs Lab 08/25/14 1452 08/29/14 1035 08/30/14 0607  NA 135* 140 140  K 4.5 4.0 3.7  CL  --  104 110  CO2 23 29 27   GLUCOSE 125 145* 113*  BUN 20.2 29* 17  CREATININE 1.1 0.81 0.70  CALCIUM 9.4 9.2 8.5   Liver Function Tests:  Recent Labs Lab 08/25/14 1452 08/30/14 0607  AST 20 21  ALT 23 19  ALKPHOS 103 78  BILITOT 2.25* 1.4*  PROT 6.3* 5.6*  ALBUMIN 2.5* 2.3*   No results for input(s): LIPASE, AMYLASE in the last 168 hours. No results for input(s): AMMONIA in the last 168 hours. CBC:  Recent Labs Lab 08/25/14 1452 08/29/14 1035 08/30/14 0607  WBC 7.8 7.8 5.0  NEUTROABS 6.1  --   --   HGB 11.9* 12.3* 10.5*  HCT 36.7* 37.5* 33.4*  MCV 82.5 84.5 85.6  PLT 269 275 183   Cardiac Enzymes:  Recent Labs Lab 08/29/14 1035  TROPONINI <0.03   BNP (last 3 results) No results for input(s): BNP in the last 8760 hours.  ProBNP (last 3 results)  Recent Labs  09/17/13 1459  PROBNP 756.4*    CBG: No results for input(s): GLUCAP in the last  168 hours.  No results found for this or any previous visit (from the past 240 hour(s)).   Studies: No results found.  Scheduled Meds: . antiseptic oral rinse  7 mL Mouth Rinse BID  . aspirin  81 mg Oral Daily  . carvedilol  6.25 mg Oral BID WC  . clopidogrel  75 mg Oral QPM  . feeding supplement (ENSURE COMPLETE)  237 mL Oral TID BM  . finasteride  5 mg Oral QPM  . heparin  5,000 Units Subcutaneous 3 times per day  . isosorbide mononitrate  30 mg Oral Daily  . pantoprazole  40 mg Oral Daily  . polyethylene glycol  17 g Oral Daily  . pravastatin  20 mg Oral QPM  . tamsulosin  0.4 mg Oral QPC supper   Continuous  Infusions: . sodium chloride 100 mL/hr at 08/31/14 1436    Principal Problem:   Metastatic disease Active Problems:   Essential hypertension   Lung mass   Dehydration   Protein-calorie malnutrition, severe   Weakness generalized    Time spent: 25 minutes. Greater than 50% of this time was spent in direct contact with the patient coordinating care.    Lelon Frohlich  [facility] Pager [phone]  If 7PM-7AM, please contact night-coverage at [email or web address], password [facility] 08/31/2014, 6:09 PM  LOS: 2 days

## 2014-09-01 ENCOUNTER — Ambulatory Visit: Payer: Medicare Other | Admitting: Cardiovascular Disease

## 2014-09-01 MED ORDER — LORAZEPAM 1 MG PO TABS: 1.0000 mg | ORAL_TABLET | Freq: Three times a day (TID) | ORAL | Status: AC

## 2014-09-01 MED ORDER — MORPHINE SULFATE (CONCENTRATE) 10 MG/0.5ML PO SOLN: 10.0000 mg | ORAL | Status: AC | PRN

## 2014-09-01 NOTE — Care Management Note (Signed)
    Page 1 of 1   09/01/2014     11:34:53 AM CARE MANAGEMENT NOTE 09/01/2014  Patient:  [name], [name]   Account Number:  [id number]  Date Initiated:  09/01/2014  Documentation initiated by:  Theophilus Kinds  Subjective/Objective Assessment:   Pt admitted from home with weakness, newly diagnosed mets Ca. Pt lives with his wife but she is now unable to care for him. Family is interested in Hospice vs. SNF.     Action/Plan:   Family [facility] and they [facility] a bed. CSW to arrange discharge to facility today.   Anticipated DC Date:  09/01/2014   Anticipated DC Plan:  [place]  In-house referral  Clinical Social Worker      DC Planning Services  CM consult      Choice offered to / List presented to:             Status of service:  Completed, signed off Medicare Important Message given?   (If response is "NO", the following Medicare IM given date fields will be blank) Date Medicare IM given:   Medicare IM given by:   Date Additional Medicare IM given:   Additional Medicare IM given by:    Discharge Disposition:  [place]  Per UR Regulation:    If discussed at Long Length of Stay Meetings, dates discussed:    Comments:  09/01/14 [place], RN BSN CM

## 2014-09-01 NOTE — Discharge Summary (Signed)
Physician Discharge Summary  [name] VVO:[id number] DOB: January 01, 1941 DOA: 08/29/2014  PCP: [name], MD  Admit date: 08/29/2014 Discharge date: 09/01/2014  Time spent: 45 minutes  Recommendations for Outpatient Follow-up:  -Will be discharged to residential hospice today for comfort care purposes.   Discharge Diagnoses:  Principal Problem:   Metastatic disease Active Problems:   Essential hypertension   Lung mass   Dehydration   Protein-calorie malnutrition, severe   Weakness generalized   Discharge Condition: Stable and improved  Filed Weights   08/29/14 1019 08/29/14 1813  Weight: 68.04 kg (150 lb) 70.035 kg (154 lb 6.4 oz)    History of present illness:  This is a 74 year old man who has had a recent diagnosis of metastatic cancer, primary unclear but most likely is coming from the lung, presents with one-week history of increasing weakness with inability to walk. He has also had poor by mouth intake for the last 2 weeks, versus a no intake for the last couple of days. He has become so weak that he cannot even get himself out of the bed and has been incontinent of stool in his bed. According to his wife, he is also been somewhat confused in the last week or so. He saw the oncologist a few days ago but cannot remember the conversation. He has been given analgesia by his primary care physician for pain in his back. He has been discovered to have radiological evidence of metastatic disease in his bones, liver and spleen. Evaluation in the emergency room today showed no metastatic disease in his brain via MRI scanning. He is now being admitted for further management.  Hospital Course:   Metastatic Cancer of Unknown Primary -Patient has deteriorated rapidly in 2 months and even quicker in the past week. -Discussed at length with wife and children at bedside. -She is no longer able to care for him at home. -He has become more lethargic and confused and is unable to  answer questions appropriately. -He has significant pain and moans constantly especially with movement. -Discussed hospice care with wife and she agrees to look into residential hospice. -Roxanol PRN for pain and ativan PRN for agitation.  Generalized Weakness/Dehydration/Severe Malnutrition -2/2 above.  Procedures:  None   Consultations:  None  Discharge Instructions  Discharge Instructions    Increase activity slowly    Complete by:  As directed             Medication List    STOP taking these medications        aspirin 81 MG chewable tablet     carvedilol 6.25 MG tablet  Commonly known as:  COREG     clopidogrel 75 MG tablet  Commonly known as:  PLAVIX     isosorbide mononitrate 30 MG 24 hr tablet  Commonly known as:  IMDUR     omeprazole 20 MG capsule  Commonly known as:  PRILOSEC     oxyCODONE 5 MG immediate release tablet  Commonly known as:  ROXICODONE      TAKE these medications        finasteride 5 MG tablet  Commonly known as:  PROSCAR  Take 5 mg by mouth every evening.     LORazepam 1 MG tablet  Commonly known as:  ATIVAN  Take 1 tablet (1 mg total) by mouth every 8 (eight) hours.     morphine CONCENTRATE 10 MG/0.5ML Soln concentrated solution  Take 0.5 mLs (10 mg total) by mouth every 4 (  four) hours as needed for severe pain.     nitroGLYCERIN 0.4 MG SL tablet  Commonly known as:  NITROSTAT  Place 1 tablet (0.4 mg total) under the tongue every 5 (five) minutes as needed for chest pain.     polyethylene glycol packet  Commonly known as:  MIRALAX / GLYCOLAX  Take 17 g by mouth daily.     pravastatin 40 MG tablet  Commonly known as:  PRAVACHOL  Take 0.5 tablets (20 mg total) by mouth every evening. Takes half tab daily     tamsulosin 0.4 MG Caps capsule  Commonly known as:  FLOMAX  Take 0.4 mg by mouth daily after supper.     VENTOLIN HFA 108 (90 BASE) MCG/ACT inhaler  Generic drug:  albuterol  Inhale 2 puffs into the lungs every  6 (six) hours as needed for wheezing or shortness of breath.       Allergies  Allergen Reactions  . Hydrochlorothiazide Other (See Comments)    REACTION: joint pain  . Benazepril Hcl Other (See Comments)    unknown      The results of significant diagnostics from this hospitalization (including imaging, microbiology, ancillary and laboratory) are listed below for reference.    Significant Diagnostic Studies: Dg Chest 1 View  08/29/2014   CLINICAL DATA:  Acute bronchitis.  Hypertension.  EXAM: CHEST  1 VIEW  COMPARISON:  August 18, 2014 chest radiograph and chest CT August 20, 2014  FINDINGS: A monitor lead obscures a known mass in the right upper lobe near the apex. Elsewhere, there is no edema or consolidation. Heart size and pulmonary vascularity are normal. No adenopathy. No bone lesions.  IMPRESSION: Known nodular lesion right upper lobe is obscured currently by an overlying monitor lead. No edema or consolidation.   Electronically Signed   By: Lowella Grip III M.D.   On: 08/29/2014 12:20   Dg Chest 1 View  08/18/2014   CLINICAL DATA:  Back pain.  EXAM: CHEST  1 VIEW  COMPARISON:  09/21/2013.  FINDINGS: Mediastinum and hilar structures are normal. Questionable density is noted in the right upper lobe. Close follow-up chest x-rays are recommended demonstrate clearing. This is not nonenhanced chest CT suggested for further evaluation. Mild basilar atelectasis heart size stable. No pleural effusion or pneumothorax.  IMPRESSION: Questionable small infiltrate versus nodular density in the right upper lobe. Follow-up chest x-ray is suggested to demonstrate clearing. If this does not clear nonenhanced chest CT suggested to exclude a right upper lobe mass.   Electronically Signed   By: Marcello Moores  Register   On: 08/18/2014 14:39   Ct Chest W Contrast  08/20/2014   CLINICAL DATA:  Severe bone pain for 3 weeks, anorexia, 25 pound weight loss, abnormal bone scan demonstrating pelvic bone  lesions, lung mass on chest radiograph question metastatic disease, past history coronary artery disease, essential hypertension, emphysema, GERD, former smoker  EXAM: CT CHEST, ABDOMEN, AND PELVIS WITH CONTRAST  TECHNIQUE: Multidetector CT imaging of the chest, abdomen and pelvis was performed following the standard protocol during bolus administration of intravenous contrast. Sagittal and coronal MPR images reconstructed from axial data set.  CONTRAST:  150mL OMNIPAQUE IOHEXOL 300 MG/ML SOLN IV. Dilute oral contrast.  COMPARISON:  CT abdomen and pelvis 09/17/2013, bone scan 08/15/2014 ; no prior CT chest for comparison.  FINDINGS: CT CHEST FINDINGS  Scattered atherosclerotic calcifications aorta and coronary arteries.  10 mm LEFT thyroid nodule image 9.  Additional nodule posterior to the LEFT thyroid lobe  11 x 13 mm image 7 question thyroid origin versus lymph node.  8 mm RIGHT paratracheal lymph node image 81.  8 mm precarinal lymph node image 24.  No additional thoracic adenopathy.  Spiculated anterior RIGHT upper lobe nodule image 15 measuring 13 x 12 mm.  Scattered atelectasis in medial RIGHT upper lobe, lingula, and in RIGHT middle lobe.  No additional mass/nodule, infiltrate, pleural effusion, or pneumothorax.  Osseous demineralization or Schmorl's node at superior endplate of N05.  Destructive lesion at LEFT coracoid process.  CT ABDOMEN AND PELVIS FINDINGS  Multiple small low-attenuation liver lesions concerning for metastases, largest 14 x 11 mm dome of RIGHT lobe image 45.  Mild splenic enlargement with a poorly defined lesion medially 2.4 x 2.5 cm image 54.  Tiny cyst inferior pole LEFT kidney.  Pancreas, kidneys, and adrenal glands otherwise normal.  Gallbladder surgically absent.  Normal appendix, bladder, and ureters.  Minimal prostatic enlargement.  Stomach and bowel loops normal.  Extensive atherosclerotic calcifications.  No adenopathy, free fluid, free air, hernia, or inflammatory process.   Large destructive bone lesion with soft tissue mass at posterior column RIGHT acetabulum, 6.0 x 6.2 cm image 120, extending through acetabular wall to RIGHT hip joint.  Bones demineralized with minimal scattered degenerative changes.  Probable lytic lesion within LEFT ilium 2 cm diameter coronal image 78.  IMPRESSION: 13 x 12 mm spiculated RIGHT upper lobe nodule suspicious for primary lung neoplasm.  Probable hepatic and splenic metastases.  Osseous metastases involving the RIGHT acetabulum into RIGHT hip joint, LEFT iliac wing, and LEFT coracoid process.  Area of abnormal radionuclide localization at the distal RIGHT femoral diaphysis on prior bone scan is not assessed by this CT exam; recommend dedicated radiographic assessment.  LEFT thyroid nodules; recommend further evaluation with thyroid ultrasound.  Atherosclerotic disease.   Electronically Signed   By: Lavonia Dana M.D.   On: 08/20/2014 13:03   Mr Jeri Cos LZ Contrast  08/29/2014   CLINICAL DATA:  Increasing weakness and confusion. Metastatic malignancy, possibly primary lung neoplasm.  EXAM: MRI HEAD WITHOUT AND WITH CONTRAST  TECHNIQUE: Multiplanar, multiecho pulse sequences of the brain and surrounding structures were obtained without and with intravenous contrast.  CONTRAST:  14 mL MultiHance  COMPARISON:  Head CT 09/17/2013  FINDINGS: There is no evidence of acute infarct, mass, midline shift, or extra-axial fluid collection. There may be a punctate focus of chronic microhemorrhage in the left frontal lobe adjacent to the frontal horn of lateral ventricle. There is mild-to-moderate generalized cerebral atrophy. Confluent periventricular white matter T2 hyperintensities are nonspecific but compatible with mild to moderate chronic small vessel ischemic disease. There is no abnormal enhancement.  Orbits are unremarkable. Paranasal sinuses and mastoid air cells are clear. Abnormal distal intracranial right vertebral artery flow void may reflect abnormal  flow due to atherosclerotic disease and stenosis. Other major intracranial vascular flow voids are grossly preserved.  IMPRESSION: 1. No evidence of acute intracranial abnormality or metastases. 2. Mild-to-moderate chronic small vessel ischemic disease and cerebral atrophy.   Electronically Signed   By: Logan Bores   On: 08/29/2014 14:10   Nm Bone Scan Whole Body  08/15/2014   ADDENDUM REPORT: 08/15/2014 13:45  ADDENDUM: Comparison made with a lumbar MRI on 08/12/2014. The small focus of increased uptake along the right side of L3 corresponds with the abnormality on the recent MRI. 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  Electronically Signed   By: Markus Daft M.D.   On: 08/15/2014 13:45   08/15/2014   ADDENDUM REPORT: 08/15/2014 12:36  ADDENDUM: These results will be called to the ordering clinician or representative by the Radiologist Assistant, and communication documented in the PACS or zVision Dashboard.   Electronically Signed   By: Markus Daft M.D.   On: 08/15/2014 12:36   08/15/2014   CLINICAL DATA:  Muscle weakness and gait abnormality.  EXAM: NUCLEAR MEDICINE WHOLE BODY BONE SCAN  TECHNIQUE: Whole body anterior and posterior images were obtained approximately 3 hours after intravenous injection of radiopharmaceutical.  RADIOPHARMACEUTICALS:  25.0 mCi Technetium-99 MDP  COMPARISON:  Abdominal CT 09/17/2013  FINDINGS: Focus of increased uptake in the distal diaphysis of the left femur. This location raises concern for a lesion or injury. There is increased uptake involving the right side of the pelvis around the right acetabulum. There is also concern for additional small foci in the left iliac wing. There are no gross abnormalities in the pelvis on the prior CT examination.  There is a focus of increased uptake along the medial left shoulder which could be degenerative in etiology. Expected uptake in the kidneys and urinary  bladder. Small focus of increased uptake along the right side of the lumbar spine near L3 is likely degenerative in etiology. Small focus of increased uptake in the paranasal sinuses region is likely inflammatory.  IMPRESSION: Abnormal uptake in the distal left femur and abnormal uptake in the pelvis. There is no gross abnormality in the pelvis on the prior CT to explain these findings. Findings raise concern for multiple bone lesions. Recommend obtaining a PSA level. The left femur could be further evaluated with radiography. Pelvis could be further evaluated with MRI.  Focus of increased uptake in the left shoulder is likely degenerative in etiology.  Electronically Signed: By: Markus Daft M.D. On: 08/15/2014 12:01   Ct Abdomen Pelvis W Contrast  08/20/2014   CLINICAL DATA:  Severe bone pain for 3 weeks, anorexia, 25 pound weight loss, abnormal bone scan demonstrating pelvic bone lesions, lung mass on chest radiograph question metastatic disease, past history coronary artery disease, essential hypertension, emphysema, GERD, former smoker  EXAM: CT CHEST, ABDOMEN, AND PELVIS WITH CONTRAST  TECHNIQUE: Multidetector CT imaging of the chest, abdomen and pelvis was performed following the standard protocol during bolus administration of intravenous contrast. Sagittal and coronal MPR images reconstructed from axial data set.  CONTRAST:  138mL OMNIPAQUE IOHEXOL 300 MG/ML SOLN IV. Dilute oral contrast.  COMPARISON:  CT abdomen and pelvis 09/17/2013, bone scan 08/15/2014 ; no prior CT chest for comparison.  FINDINGS: CT CHEST FINDINGS  Scattered atherosclerotic calcifications aorta and coronary arteries.  10 mm LEFT thyroid nodule image 9.  Additional nodule posterior to the LEFT thyroid lobe 11 x 13 mm image 7 question thyroid origin versus lymph node.  8 mm RIGHT paratracheal lymph node image 81.  8 mm precarinal lymph node image 24.  No additional thoracic adenopathy.  Spiculated anterior RIGHT upper lobe nodule image  15 measuring 13 x 12 mm.  Scattered atelectasis in medial RIGHT upper lobe, lingula, and in RIGHT middle lobe.  No additional mass/nodule, infiltrate, pleural effusion, or pneumothorax.  Osseous demineralization or Schmorl's node at superior endplate of W09.  Destructive lesion at LEFT coracoid process.  CT ABDOMEN AND PELVIS FINDINGS  Multiple small low-attenuation liver lesions concerning for metastases, largest 14 x 11 mm dome of RIGHT lobe image 45.  Mild splenic enlargement  with a poorly defined lesion medially 2.4 x 2.5 cm image 54.  Tiny cyst inferior pole LEFT kidney.  Pancreas, kidneys, and adrenal glands otherwise normal.  Gallbladder surgically absent.  Normal appendix, bladder, and ureters.  Minimal prostatic enlargement.  Stomach and bowel loops normal.  Extensive atherosclerotic calcifications.  No adenopathy, free fluid, free air, hernia, or inflammatory process.  Large destructive bone lesion with soft tissue mass at posterior column RIGHT acetabulum, 6.0 x 6.2 cm image 120, extending through acetabular wall to RIGHT hip joint.  Bones demineralized with minimal scattered degenerative changes.  Probable lytic lesion within LEFT ilium 2 cm diameter coronal image 78.  IMPRESSION: 13 x 12 mm spiculated RIGHT upper lobe nodule suspicious for primary lung neoplasm.  Probable hepatic and splenic metastases.  Osseous metastases involving the RIGHT acetabulum into RIGHT hip joint, LEFT iliac wing, and LEFT coracoid process.  Area of abnormal radionuclide localization at the distal RIGHT femoral diaphysis on prior bone scan is not assessed by this CT exam; recommend dedicated radiographic assessment.  LEFT thyroid nodules; recommend further evaluation with thyroid ultrasound.  Atherosclerotic disease.   Electronically Signed   By: Lavonia Dana M.D.   On: 08/20/2014 13:03    Microbiology: No results found for this or any previous visit (from the past 240 hour(s)).   Labs: Basic Metabolic Panel:  Recent  Labs Lab 08/25/14 1452 08/29/14 1035 08/30/14 0607  NA 135* 140 140  K 4.5 4.0 3.7  CL  --  104 110  CO2 23 29 27   GLUCOSE 125 145* 113*  BUN 20.2 29* 17  CREATININE 1.1 0.81 0.70  CALCIUM 9.4 9.2 8.5   Liver Function Tests:  Recent Labs Lab 08/25/14 1452 08/30/14 0607  AST 20 21  ALT 23 19  ALKPHOS 103 78  BILITOT 2.25* 1.4*  PROT 6.3* 5.6*  ALBUMIN 2.5* 2.3*   No results for input(s): LIPASE, AMYLASE in the last 168 hours. No results for input(s): AMMONIA in the last 168 hours. CBC:  Recent Labs Lab 08/25/14 1452 08/29/14 1035 08/30/14 0607  WBC 7.8 7.8 5.0  NEUTROABS 6.1  --   --   HGB 11.9* 12.3* 10.5*  HCT 36.7* 37.5* 33.4*  MCV 82.5 84.5 85.6  PLT 269 275 183   Cardiac Enzymes:  Recent Labs Lab 08/29/14 1035  TROPONINI <0.03   BNP: BNP (last 3 results) No results for input(s): BNP in the last 8760 hours.  ProBNP (last 3 results)  Recent Labs  09/17/13 1459  PROBNP 756.4*    CBG: No results for input(s): GLUCAP in the last 168 hours.     SignedLelon Frohlich  [facility] Pager: [phone] 09/01/2014, 2:10 PM

## 2014-09-01 NOTE — Progress Notes (Signed)
PT Cancellation Note  Patient Details Name: [name] MRN: [id number] DOB: 07-28-1940   Cancelled Treatment:    Reason Eval/Treat Not Completed: Other (comment).  Pt is to transition to [facility].  Will d/c orders for PT.   [name] 09/01/2014, 1:57 PM

## 2014-09-01 NOTE — Clinical Social Work Psychosocial (Signed)
Clinical Social Work Department BRIEF PSYCHOSOCIAL ASSESSMENT 09/01/2014  Patient:  JAYCUB, NOORANI     Account Number:  [id number]     Admit date:  08/29/2014  Clinical Social Worker:  Wyatt Haste  Date/Time:  09/01/2014 11:22 AM  Referred by:  Physician  Date Referred:  09/01/2014 Referred for  Residential hospice placement   Other Referral:   Interview type:  Family Other interview type:   wife- [name]    PSYCHOSOCIAL DATA Living Status:  FAMILY Admitted from facility:   Level of care:   Primary support name:  [name] Primary support relationship to patient:  SPOUSE Degree of support available:   supportive    CURRENT CONCERNS Current Concerns  Post-Acute Placement   Other Concerns:    SOCIAL WORK ASSESSMENT / PLAN CSW met with pt's wife and son after discussion with MD regarding hospice. Pt's wife reports that pt was recently diagnosed with stage IV cancer. They have made the decision to not have biopsy and treatment. Pt has declined in the past few months, but significantly in the past week. He is no longer mobile. When asked how family is processing all of this, pt's wife initially said, "It is what it is." She then began to cry. CSW provided support. She feels that she cannot provide all of the care that pt will require. CSW discussed other options and she requests Hospice Home. She states that [facility] would be closest for everyone. CSW has arranged for hospice to meet with family after lunch. MD notified.   Assessment/plan status:  Psychosocial Support/Ongoing Assessment of Needs Other assessment/ plan:   Information/referral to community resources:   Hospice Home    PATIENT'S/FAMILY'S RESPONSE TO PLAN OF CARE: Pt oriented to self only per chart. Family request Hospice Home and bed is available today. CSW will follow up after hospice discussion.       Benay Pike, [place]

## 2014-09-01 NOTE — Plan of Care (Signed)
Problem: Phase I Progression Outcomes Goal: Voiding-avoid urinary catheter unless indicated Outcome: Not Applicable Date Met:  96/78/93 Pt. Has foley for End of Life care

## 2014-09-01 NOTE — Progress Notes (Signed)
UR chart review completed.  

## 2014-09-01 NOTE — Progress Notes (Signed)
Report given to Kim at [facility]. No further questions about patient. Stable at discharge. IV remained in place at discharge at request of Hospice nurse. Transported by EMS to [facility].

## 2014-09-01 NOTE — Progress Notes (Signed)
PT Cancellation Note  Patient Details Name: [name] MRN: [id number] DOB: 1941-04-19   Cancelled Treatment:    Reason Eval/Treat Not Completed: Other (comment).  Wife states that pt will probably be transferred to Hospice and plans to speak with the MD about this today.  She requests that we hold any PT at this time.   [name] 09/01/2014, 10:59 AM

## 2014-09-01 NOTE — Clinical Social Work Note (Signed)
Family is agreeable to transfer to [facility] this afternoon. Out of facility DNR sent with pt. Pt to transfer via [facility]. D/C summary faxed.  Benay Pike, [place]

## 2014-09-02 ENCOUNTER — Ambulatory Visit (HOSPITAL_COMMUNITY): Admission: RE | Admit: 2014-09-02 | Payer: Medicare Other | Source: Ambulatory Visit

## 2014-09-02 ENCOUNTER — Telehealth: Payer: Self-pay | Admitting: Family Medicine

## 2014-09-02 DIAGNOSIS — Z515 Encounter for palliative care: Secondary | ICD-10-CM

## 2014-09-02 NOTE — Telephone Encounter (Signed)
Called and LMOVM x2.  Will try to call again tomorrow.

## 2014-09-04 ENCOUNTER — Telehealth: Payer: Self-pay | Admitting: Internal Medicine

## 2014-09-04 DIAGNOSIS — Z515 Encounter for palliative care: Secondary | ICD-10-CM | POA: Insufficient documentation

## 2014-09-04 NOTE — Telephone Encounter (Signed)
returned call and line was busy °

## 2014-09-04 NOTE — Telephone Encounter (Signed)
Late entry. Called wife last night.  Husband in hospice facility.  Further w/u cancelled.   I told her I completely support her decision.  I think this is the right way to care for him.  I offered my support to her and her family.  She thanked me for the call.

## 2014-09-05 ENCOUNTER — Telehealth: Payer: Self-pay | Admitting: Internal Medicine

## 2014-09-05 NOTE — Telephone Encounter (Signed)
pt wife called to cx appt due to pt on hospice...done

## 2014-09-08 ENCOUNTER — Encounter (HOSPITAL_COMMUNITY): Payer: Medicare Other

## 2014-09-08 ENCOUNTER — Ambulatory Visit (HOSPITAL_COMMUNITY): Payer: Medicare Other

## 2014-09-09 ENCOUNTER — Other Ambulatory Visit: Payer: Medicare Other

## 2014-09-10 ENCOUNTER — Ambulatory Visit: Payer: Medicare Other | Admitting: Internal Medicine

## 2014-09-10 ENCOUNTER — Other Ambulatory Visit: Payer: Medicare Other

## 2014-09-16 ENCOUNTER — Encounter: Payer: Medicare Other | Admitting: Family Medicine

## 2014-10-03 DEATH — deceased

## 2014-10-06 ENCOUNTER — Ambulatory Visit: Payer: Medicare Other | Admitting: Cardiovascular Disease

## 2015-06-19 ENCOUNTER — Other Ambulatory Visit (HOSPITAL_COMMUNITY): Payer: Medicare Other

## 2015-06-19 ENCOUNTER — Encounter (HOSPITAL_COMMUNITY): Payer: Medicare Other

## 2015-06-19 ENCOUNTER — Ambulatory Visit: Payer: Medicare Other | Admitting: Family

## 2015-09-09 IMAGING — CT CT ABD-PELV W/O CM
2 of 4 series · 16 of 46 positions shown, 18 images · non-contrast
Comparison: IR PERC CHOLECYSTOSTOMY dated 07/17/2013; CT ABD/PELV WO
CM dated 02/05/2012; US ABDOMEN LIMITED dated 07/15/2013

CLINICAL DATA: Patient is lethargic.  Generalized weakness.

EXAM:
CT ABDOMEN AND PELVIS WITHOUT CONTRAST
TECHNIQUE: Multidetector CT imaging of the abdomen and pelvis was performed
following the standard protocol without intravenous contrast.

[Series 2: abd/ pelvis 5.0 i30f 1 · axial · 0.86mm/px · z∈[-878,-418]mm · 13 of 102 slices shown, 15 images]
[im 5/102  soft-tissue]
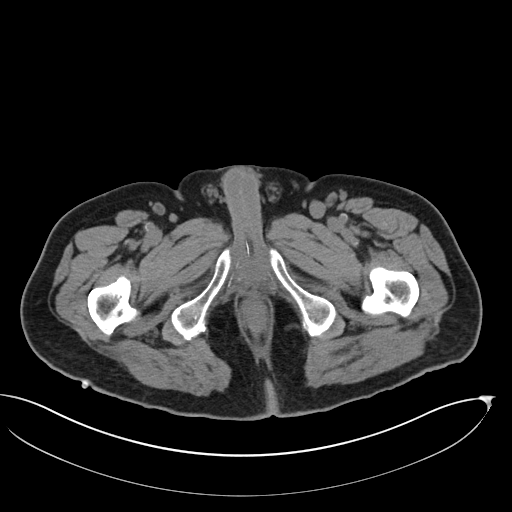
[im 5/102  bone]
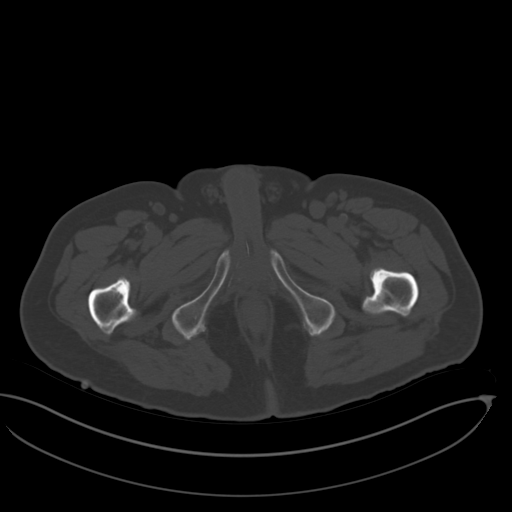
[im 13/102  soft-tissue]
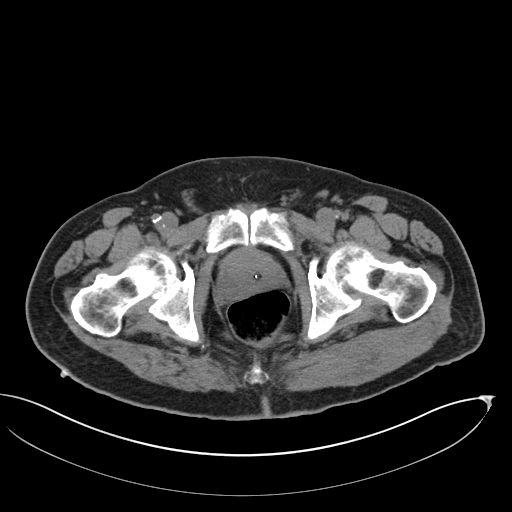
[im 22/102  soft-tissue]
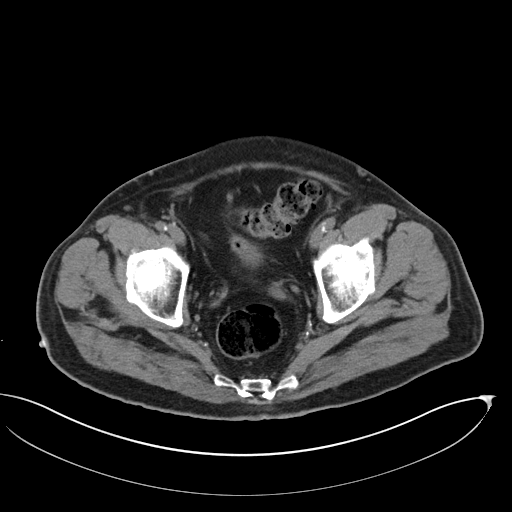
[im 30/102  soft-tissue]
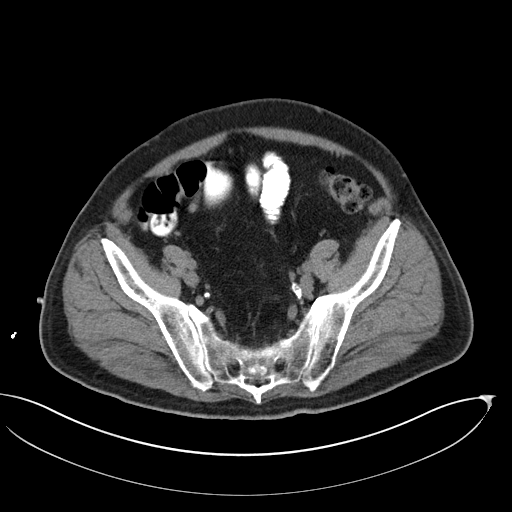
[im 34/102  soft-tissue]
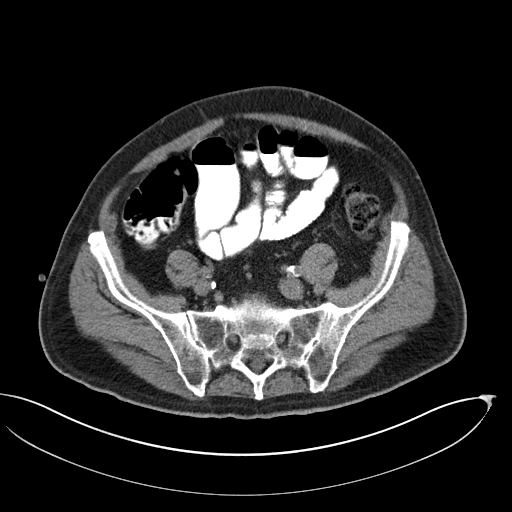
[im 43/102  soft-tissue]
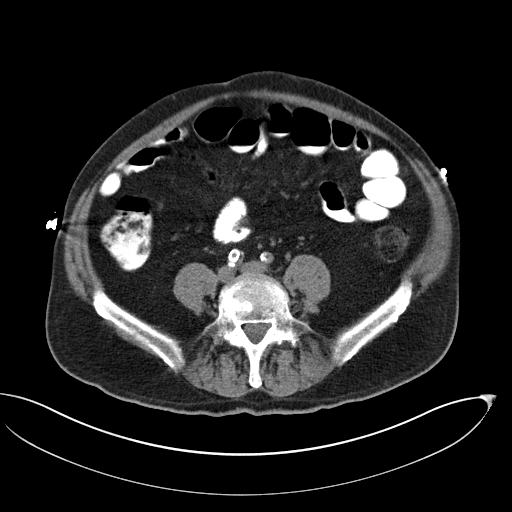
[im 51/102  soft-tissue]
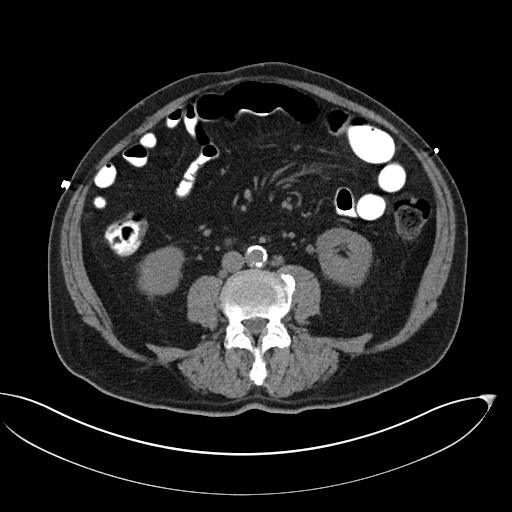
[im 59/102  soft-tissue]
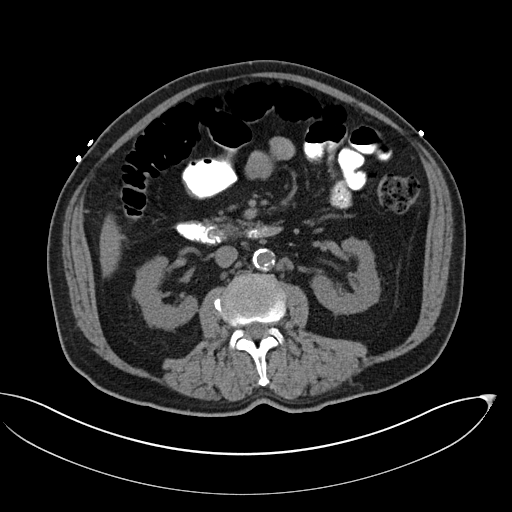
[im 68/102  soft-tissue]
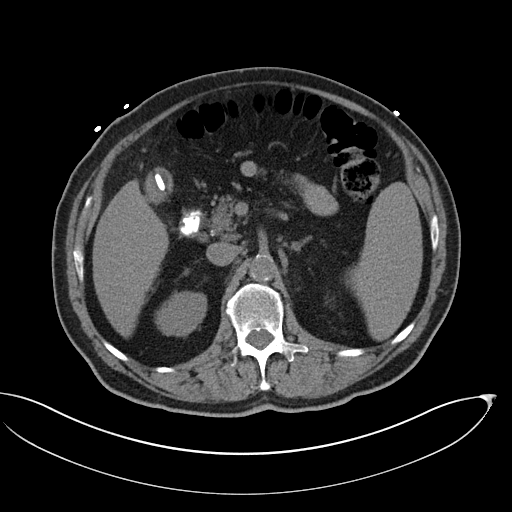
[im 68/102  bone]
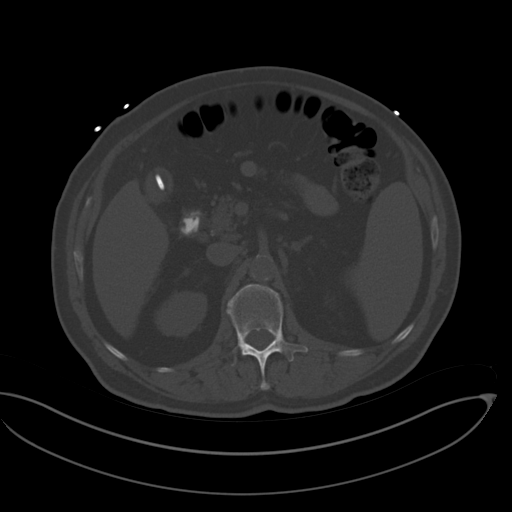
[im 72/102  soft-tissue]
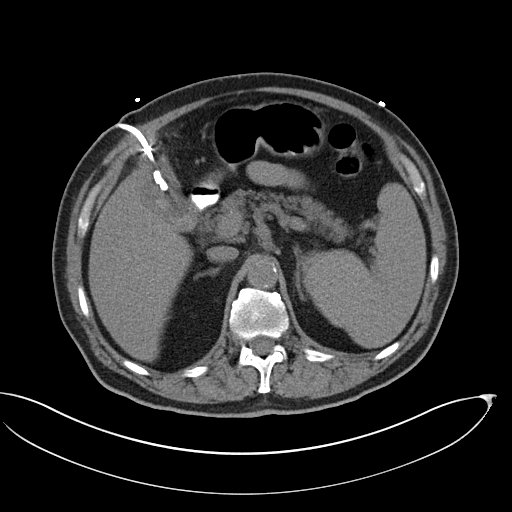
[im 80/102  soft-tissue]
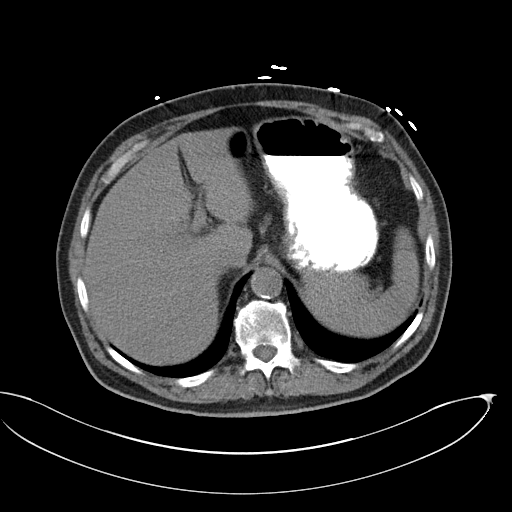
[im 89/102  soft-tissue]
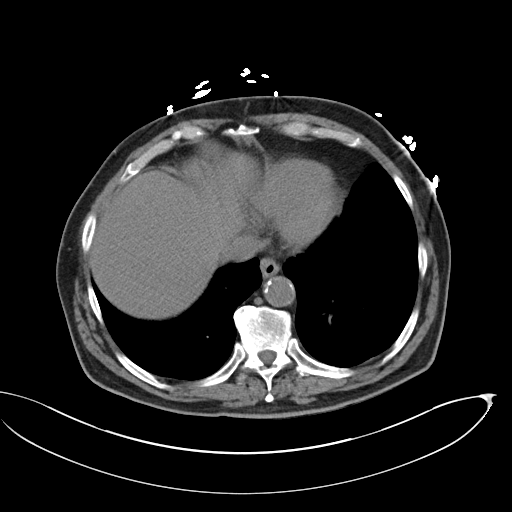
[im 97/102  soft-tissue]
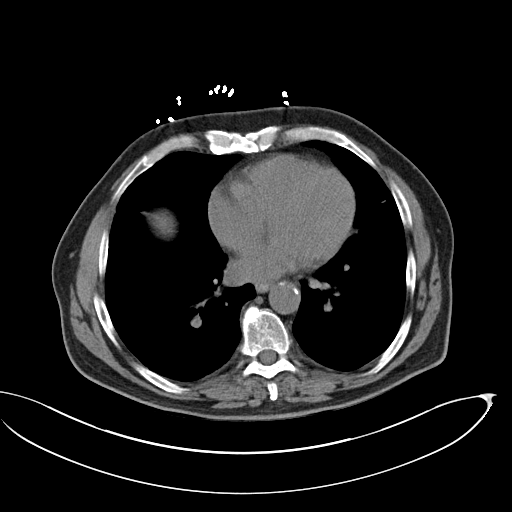

[Series 4: cor · coronal · 0.83mm/px · 3 of 103 slices shown]
[im 35/103  soft-tissue]
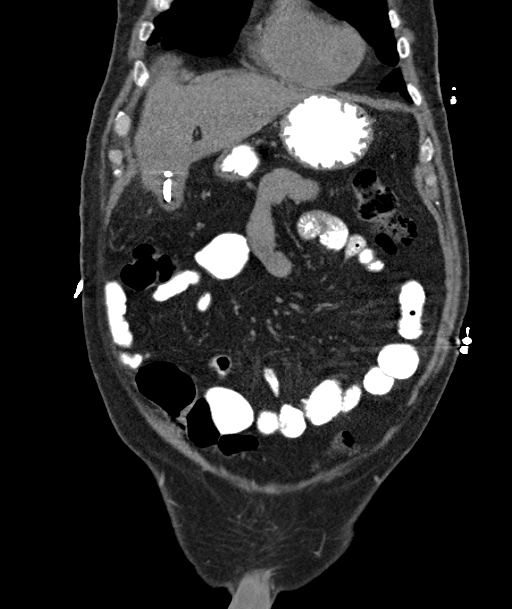
[im 46/103  soft-tissue]
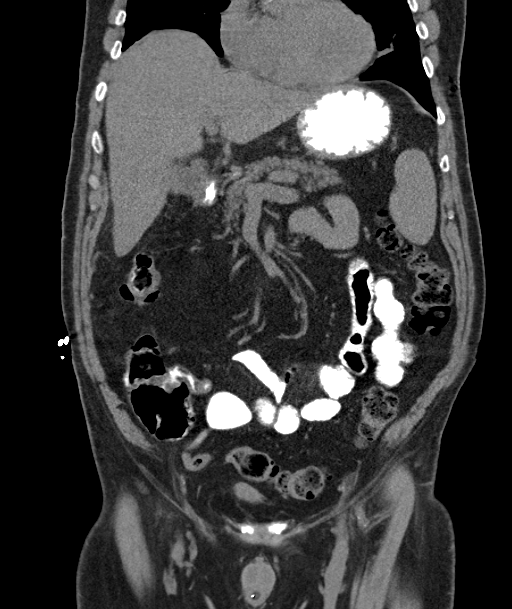
[im 57/103  soft-tissue]
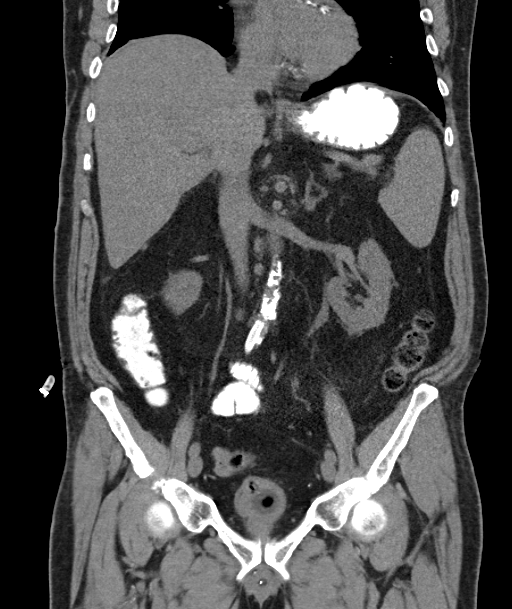

[16 of 46 positions shown; findings below may reference images not displayed]

FINDINGS: The lung bases are clear.

No renal, ureteral, or bladder calculi. No obstructive uropathy. No
perinephric stranding is seen. The kidneys are symmetric in size
without evidence for exophytic mass. There is a Foley catheter
present within the bladder. The prostate gland is mildly enlarged
measuring 6.4 cm x 4.5 cm in greatest transverse dimension.

The gallbladder wall is thickened. There is a percutaneous
cholecystostomy tube coiled within the gallbladder which is
decompressed.

The liver demonstrates no focal abnormality. The spleen demonstrates
no focal abnormality. The adrenal glands and pancreas are normal.

The stomach, duodenum, small intestine and large intestine are
unremarkable. There is no bowel dilatation to suggest obstruction.
There is no pneumoperitoneum, pneumatosis, or portal venous gas.
There is no abdominal or pelvic free fluid. There is no
lymphadenopathy.

The abdominal aorta is normal in caliber with atherosclerosis.

There is severe degenerative disc disease at L5-S1 with disc height
loss. There is mild degenerative disc disease at L4-5. There is
bilateral foraminal stenosis at L5-S1.
IMPRESSION: 1. Gallbladder wall thickening with a percutaneous cholecystostomy
tube within the gallbladder in satisfactory position.
2. Foley catheter within the bladder in satisfactory position.
3. No bowel obstruction.

## 2015-09-09 IMAGING — CR DG CHEST 1V PORT
1 series · 1 of 1 positions shown · non-contrast
Comparison: DG CHEST 1 VIEW dated 07/17/2013; DG CHEST 2 VIEW dated
07/15/2013; DG CHEST 2 VIEW dated 06/21/2012

CLINICAL DATA: Sepsis, weakness

EXAM:
PORTABLE CHEST - 1 VIEW

[AP]
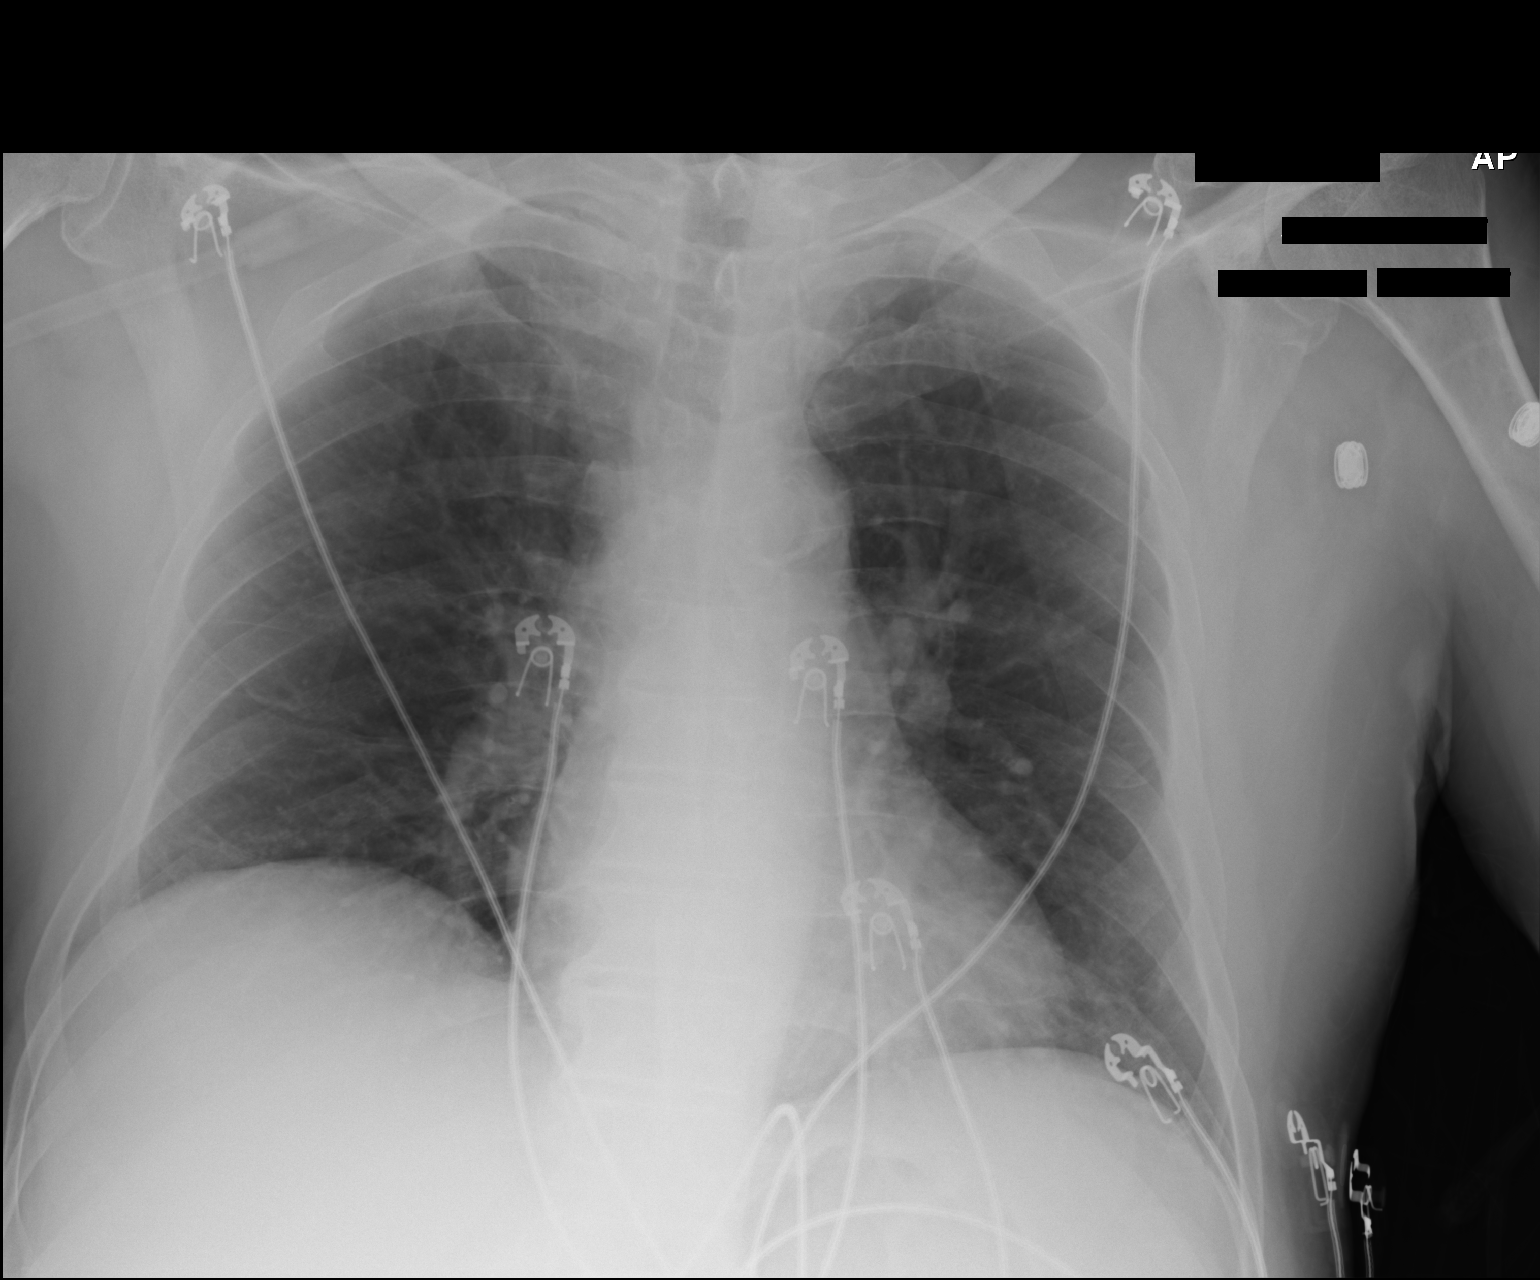

[1 of 1 positions shown; findings below may reference images not displayed]

FINDINGS: The heart size and mediastinal contours are within normal limits.
Both lungs are clear. The visualized skeletal structures are
unremarkable.
IMPRESSION: No active disease.
